# Patient Record
Sex: Female | Born: 1950 | Race: White
Health system: Southern US, Community
[De-identification: ages and names within clinical notes are randomized; demographics above are authoritative.]

## PROBLEM LIST (undated history)

## (undated) DIAGNOSIS — I341 Nonrheumatic mitral (valve) prolapse: Secondary | ICD-10-CM

## (undated) DIAGNOSIS — I10 Essential (primary) hypertension: Secondary | ICD-10-CM

## (undated) DIAGNOSIS — N200 Calculus of kidney: Secondary | ICD-10-CM

## (undated) DIAGNOSIS — B4481 Allergic bronchopulmonary aspergillosis: Secondary | ICD-10-CM

## (undated) DIAGNOSIS — E785 Hyperlipidemia, unspecified: Secondary | ICD-10-CM

## (undated) DIAGNOSIS — K635 Polyp of colon: Secondary | ICD-10-CM

## (undated) DIAGNOSIS — I82409 Acute embolism and thrombosis of unspecified deep veins of unspecified lower extremity: Secondary | ICD-10-CM

## (undated) DIAGNOSIS — F419 Anxiety disorder, unspecified: Secondary | ICD-10-CM

## (undated) DIAGNOSIS — Q501 Developmental ovarian cyst: Secondary | ICD-10-CM

## (undated) HISTORY — DX: Nonrheumatic mitral (valve) prolapse: I34.1

## (undated) HISTORY — DX: Hyperlipidemia, unspecified: E78.5

## (undated) HISTORY — DX: Allergic bronchopulmonary aspergillosis: B44.81

## (undated) HISTORY — DX: Polyp of colon: K63.5

## (undated) HISTORY — PX: COLONOSCOPY: SHX174

## (undated) HISTORY — DX: Acute embolism and thrombosis of unspecified deep veins of unspecified lower extremity: I82.409

## (undated) HISTORY — DX: Essential (primary) hypertension: I10

## (undated) HISTORY — PX: TONSILLECTOMY: SUR1361

## (undated) HISTORY — PX: OOPHORECTOMY: SHX86

## (undated) HISTORY — DX: Anxiety disorder, unspecified: F41.9

---

## 2000-10-24 ENCOUNTER — Emergency Department (HOSPITAL_COMMUNITY): Admission: EM | Admit: 2000-10-24 | Discharge: 2000-10-24 | Payer: Self-pay | Admitting: Emergency Medicine

## 2001-04-04 ENCOUNTER — Encounter: Payer: Self-pay | Admitting: Gynecology

## 2001-04-04 ENCOUNTER — Encounter: Admission: RE | Admit: 2001-04-04 | Discharge: 2001-04-04 | Payer: Self-pay | Admitting: Gynecology

## 2001-05-02 ENCOUNTER — Other Ambulatory Visit: Admission: RE | Admit: 2001-05-02 | Discharge: 2001-05-02 | Payer: Self-pay | Admitting: Gynecology

## 2002-05-11 ENCOUNTER — Other Ambulatory Visit: Admission: RE | Admit: 2002-05-11 | Discharge: 2002-05-11 | Payer: Self-pay | Admitting: Gynecology

## 2003-05-25 ENCOUNTER — Encounter: Admission: RE | Admit: 2003-05-25 | Discharge: 2003-05-25 | Payer: Self-pay | Admitting: Gynecology

## 2003-05-25 ENCOUNTER — Encounter: Payer: Self-pay | Admitting: Gynecology

## 2003-05-31 ENCOUNTER — Other Ambulatory Visit: Admission: RE | Admit: 2003-05-31 | Discharge: 2003-05-31 | Payer: Self-pay | Admitting: Gynecology

## 2003-06-14 ENCOUNTER — Encounter: Admission: RE | Admit: 2003-06-14 | Discharge: 2003-06-14 | Payer: Self-pay | Admitting: Internal Medicine

## 2003-06-14 ENCOUNTER — Encounter: Payer: Self-pay | Admitting: Internal Medicine

## 2003-07-05 ENCOUNTER — Encounter: Admission: RE | Admit: 2003-07-05 | Discharge: 2003-07-05 | Payer: Self-pay | Admitting: Family Medicine

## 2003-07-05 ENCOUNTER — Encounter: Payer: Self-pay | Admitting: Family Medicine

## 2003-08-09 ENCOUNTER — Other Ambulatory Visit: Admission: RE | Admit: 2003-08-09 | Discharge: 2003-08-09 | Payer: Self-pay | Admitting: Gynecology

## 2003-09-07 ENCOUNTER — Encounter (INDEPENDENT_AMBULATORY_CARE_PROVIDER_SITE_OTHER): Payer: Self-pay | Admitting: Specialist

## 2003-09-07 ENCOUNTER — Ambulatory Visit (HOSPITAL_COMMUNITY): Admission: RE | Admit: 2003-09-07 | Discharge: 2003-09-07 | Payer: Self-pay | Admitting: Internal Medicine

## 2003-09-25 ENCOUNTER — Ambulatory Visit (HOSPITAL_COMMUNITY): Admission: RE | Admit: 2003-09-25 | Discharge: 2003-09-25 | Payer: Self-pay | Admitting: Internal Medicine

## 2003-10-20 DIAGNOSIS — B4481 Allergic bronchopulmonary aspergillosis: Secondary | ICD-10-CM

## 2003-10-20 HISTORY — DX: Allergic bronchopulmonary aspergillosis: B44.81

## 2004-01-21 ENCOUNTER — Encounter: Admission: RE | Admit: 2004-01-21 | Discharge: 2004-01-21 | Payer: Self-pay | Admitting: Internal Medicine

## 2004-02-15 ENCOUNTER — Other Ambulatory Visit: Admission: RE | Admit: 2004-02-15 | Discharge: 2004-02-15 | Payer: Self-pay | Admitting: Gynecology

## 2004-04-27 ENCOUNTER — Emergency Department (HOSPITAL_COMMUNITY): Admission: EM | Admit: 2004-04-27 | Discharge: 2004-04-27 | Payer: Self-pay | Admitting: Family Medicine

## 2004-04-30 ENCOUNTER — Emergency Department (HOSPITAL_COMMUNITY): Admission: EM | Admit: 2004-04-30 | Discharge: 2004-04-30 | Payer: Self-pay | Admitting: Family Medicine

## 2004-04-30 ENCOUNTER — Encounter: Admission: RE | Admit: 2004-04-30 | Discharge: 2004-04-30 | Payer: Self-pay | Admitting: Internal Medicine

## 2004-07-11 ENCOUNTER — Ambulatory Visit (HOSPITAL_COMMUNITY): Admission: RE | Admit: 2004-07-11 | Discharge: 2004-07-11 | Payer: Self-pay | Admitting: Internal Medicine

## 2004-07-31 ENCOUNTER — Other Ambulatory Visit: Admission: RE | Admit: 2004-07-31 | Discharge: 2004-07-31 | Payer: Self-pay | Admitting: Gynecology

## 2004-08-06 ENCOUNTER — Ambulatory Visit (HOSPITAL_COMMUNITY): Admission: RE | Admit: 2004-08-06 | Discharge: 2004-08-06 | Payer: Self-pay | Admitting: Internal Medicine

## 2004-09-15 ENCOUNTER — Encounter: Admission: RE | Admit: 2004-09-15 | Discharge: 2004-09-15 | Payer: Self-pay | Admitting: Gynecology

## 2004-10-29 ENCOUNTER — Ambulatory Visit: Payer: Self-pay | Admitting: Internal Medicine

## 2004-12-29 ENCOUNTER — Ambulatory Visit: Payer: Self-pay | Admitting: Internal Medicine

## 2005-04-01 ENCOUNTER — Ambulatory Visit: Payer: Self-pay | Admitting: Internal Medicine

## 2005-07-30 ENCOUNTER — Ambulatory Visit: Payer: Self-pay | Admitting: Internal Medicine

## 2005-09-15 ENCOUNTER — Other Ambulatory Visit: Admission: RE | Admit: 2005-09-15 | Discharge: 2005-09-15 | Payer: Self-pay | Admitting: Gynecology

## 2005-09-23 ENCOUNTER — Ambulatory Visit: Admission: RE | Admit: 2005-09-23 | Discharge: 2005-09-23 | Payer: Self-pay | Admitting: Internal Medicine

## 2005-12-02 ENCOUNTER — Encounter: Admission: RE | Admit: 2005-12-02 | Discharge: 2005-12-02 | Payer: Self-pay | Admitting: Gynecology

## 2006-02-15 ENCOUNTER — Ambulatory Visit: Payer: Self-pay | Admitting: Internal Medicine

## 2006-08-03 ENCOUNTER — Emergency Department (HOSPITAL_COMMUNITY): Admission: EM | Admit: 2006-08-03 | Discharge: 2006-08-03 | Payer: Self-pay | Admitting: Family Medicine

## 2006-08-06 ENCOUNTER — Ambulatory Visit: Payer: Self-pay | Admitting: Internal Medicine

## 2006-08-16 ENCOUNTER — Ambulatory Visit: Payer: Self-pay | Admitting: Internal Medicine

## 2006-09-22 ENCOUNTER — Other Ambulatory Visit: Admission: RE | Admit: 2006-09-22 | Discharge: 2006-09-22 | Payer: Self-pay | Admitting: Gynecology

## 2006-10-19 DIAGNOSIS — K635 Polyp of colon: Secondary | ICD-10-CM

## 2006-10-19 HISTORY — DX: Polyp of colon: K63.5

## 2006-11-22 ENCOUNTER — Ambulatory Visit: Payer: Self-pay | Admitting: Internal Medicine

## 2007-01-04 ENCOUNTER — Encounter: Admission: RE | Admit: 2007-01-04 | Discharge: 2007-01-04 | Payer: Self-pay | Admitting: Gynecology

## 2007-01-06 ENCOUNTER — Ambulatory Visit: Payer: Self-pay | Admitting: Internal Medicine

## 2007-05-24 ENCOUNTER — Ambulatory Visit: Payer: Self-pay | Admitting: Internal Medicine

## 2007-05-31 ENCOUNTER — Encounter (INDEPENDENT_AMBULATORY_CARE_PROVIDER_SITE_OTHER): Payer: Self-pay | Admitting: *Deleted

## 2007-06-10 ENCOUNTER — Ambulatory Visit: Payer: Self-pay | Admitting: Internal Medicine

## 2007-06-10 DIAGNOSIS — B4481 Allergic bronchopulmonary aspergillosis: Secondary | ICD-10-CM | POA: Insufficient documentation

## 2007-06-10 LAB — CONVERTED CEMR LAB
HDL goal, serum: 40 mg/dL
LDL Goal: 160 mg/dL

## 2007-07-11 ENCOUNTER — Ambulatory Visit: Payer: Self-pay | Admitting: Internal Medicine

## 2007-08-17 ENCOUNTER — Ambulatory Visit: Payer: Self-pay | Admitting: Internal Medicine

## 2007-08-19 ENCOUNTER — Ambulatory Visit: Payer: Self-pay | Admitting: Internal Medicine

## 2007-08-26 ENCOUNTER — Encounter (INDEPENDENT_AMBULATORY_CARE_PROVIDER_SITE_OTHER): Payer: Self-pay | Admitting: *Deleted

## 2007-08-26 LAB — CONVERTED CEMR LAB
Cholesterol: 170 mg/dL (ref 0–200)
LDL Cholesterol: 99 mg/dL (ref 0–99)
Total CHOL/HDL Ratio: 3.1

## 2007-09-01 ENCOUNTER — Ambulatory Visit: Payer: Self-pay | Admitting: Internal Medicine

## 2007-10-20 HISTORY — PX: COLONOSCOPY W/ POLYPECTOMY: SHX1380

## 2007-10-20 HISTORY — PX: NASAL SINUS SURGERY: SHX719

## 2007-10-21 ENCOUNTER — Encounter: Payer: Self-pay | Admitting: Internal Medicine

## 2007-11-21 LAB — HM COLONOSCOPY: HM COLON: NORMAL

## 2007-11-22 ENCOUNTER — Telehealth (INDEPENDENT_AMBULATORY_CARE_PROVIDER_SITE_OTHER): Payer: Self-pay | Admitting: *Deleted

## 2008-01-06 ENCOUNTER — Encounter: Admission: RE | Admit: 2008-01-06 | Discharge: 2008-01-06 | Payer: Self-pay | Admitting: Gynecology

## 2008-01-09 ENCOUNTER — Ambulatory Visit: Payer: Self-pay | Admitting: Internal Medicine

## 2008-01-09 DIAGNOSIS — I82409 Acute embolism and thrombosis of unspecified deep veins of unspecified lower extremity: Secondary | ICD-10-CM | POA: Insufficient documentation

## 2008-01-09 DIAGNOSIS — J4530 Mild persistent asthma, uncomplicated: Secondary | ICD-10-CM

## 2008-01-18 ENCOUNTER — Encounter: Admission: RE | Admit: 2008-01-18 | Discharge: 2008-01-18 | Payer: Self-pay | Admitting: Gynecology

## 2008-02-28 ENCOUNTER — Encounter: Payer: Self-pay | Admitting: Internal Medicine

## 2008-04-25 LAB — HM COLONOSCOPY: HM Colonoscopy: NORMAL

## 2008-05-04 ENCOUNTER — Ambulatory Visit: Payer: Self-pay | Admitting: Internal Medicine

## 2008-05-30 ENCOUNTER — Telehealth (INDEPENDENT_AMBULATORY_CARE_PROVIDER_SITE_OTHER): Payer: Self-pay | Admitting: *Deleted

## 2008-07-11 ENCOUNTER — Ambulatory Visit: Payer: Self-pay | Admitting: Internal Medicine

## 2008-07-13 ENCOUNTER — Encounter: Payer: Self-pay | Admitting: Internal Medicine

## 2008-07-23 ENCOUNTER — Encounter (INDEPENDENT_AMBULATORY_CARE_PROVIDER_SITE_OTHER): Payer: Self-pay | Admitting: Otolaryngology

## 2008-07-23 ENCOUNTER — Ambulatory Visit (HOSPITAL_BASED_OUTPATIENT_CLINIC_OR_DEPARTMENT_OTHER): Admission: RE | Admit: 2008-07-23 | Discharge: 2008-07-23 | Payer: Self-pay | Admitting: Otolaryngology

## 2008-12-26 ENCOUNTER — Encounter: Payer: Self-pay | Admitting: Internal Medicine

## 2008-12-31 ENCOUNTER — Encounter (INDEPENDENT_AMBULATORY_CARE_PROVIDER_SITE_OTHER): Payer: Self-pay | Admitting: *Deleted

## 2008-12-31 ENCOUNTER — Ambulatory Visit: Payer: Self-pay | Admitting: Internal Medicine

## 2008-12-31 DIAGNOSIS — Z86010 Personal history of colonic polyps: Secondary | ICD-10-CM | POA: Insufficient documentation

## 2008-12-31 DIAGNOSIS — E785 Hyperlipidemia, unspecified: Secondary | ICD-10-CM

## 2008-12-31 DIAGNOSIS — R51 Headache: Secondary | ICD-10-CM

## 2008-12-31 DIAGNOSIS — R519 Headache, unspecified: Secondary | ICD-10-CM | POA: Insufficient documentation

## 2008-12-31 DIAGNOSIS — I10 Essential (primary) hypertension: Secondary | ICD-10-CM | POA: Insufficient documentation

## 2008-12-31 LAB — CONVERTED CEMR LAB
Cholesterol, target level: 200 mg/dL
HDL goal, serum: 50 mg/dL
LDL Goal: 100 mg/dL

## 2009-01-08 ENCOUNTER — Ambulatory Visit: Payer: Self-pay | Admitting: Internal Medicine

## 2009-01-15 LAB — CONVERTED CEMR LAB
BUN: 16 mg/dL (ref 6–23)
Basophils Absolute: 0 10*3/uL (ref 0.0–0.1)
CO2: 28 meq/L (ref 19–32)
Calcium: 8.9 mg/dL (ref 8.4–10.5)
Chloride: 105 meq/L (ref 96–112)
Eosinophils Relative: 11.2 % — ABNORMAL HIGH (ref 0.0–5.0)
GFR calc non Af Amer: 91.6 mL/min (ref >60)
HCT: 41.2 % (ref 36.0–46.0)
Hemoglobin: 14.1 g/dL (ref 12.0–15.0)
LDL Cholesterol: 111 mg/dL — ABNORMAL HIGH (ref 0–99)
Lymphs Abs: 2.2 10*3/uL (ref 0.7–4.0)
MCHC: 34.3 g/dL (ref 30.0–36.0)
MCV: 91.2 fL (ref 78.0–100.0)
Monocytes Relative: 6.9 % (ref 3.0–12.0)
Neutro Abs: 6 10*3/uL (ref 1.4–7.7)
Neutrophils Relative %: 60 % (ref 43.0–77.0)
Platelets: 317 10*3/uL (ref 150.0–400.0)
RBC: 4.52 M/uL (ref 3.87–5.11)
RDW: 13.2 % (ref 11.5–14.6)
Sodium: 141 meq/L (ref 135–145)
Total Bilirubin: 0.6 mg/dL (ref 0.3–1.2)
Total Protein: 6.3 g/dL (ref 6.0–8.3)
VLDL: 16.4 mg/dL (ref 0.0–40.0)
WBC: 10 10*3/uL (ref 4.5–10.5)

## 2009-01-17 ENCOUNTER — Encounter (INDEPENDENT_AMBULATORY_CARE_PROVIDER_SITE_OTHER): Payer: Self-pay | Admitting: *Deleted

## 2009-01-17 ENCOUNTER — Telehealth (INDEPENDENT_AMBULATORY_CARE_PROVIDER_SITE_OTHER): Payer: Self-pay | Admitting: *Deleted

## 2009-01-29 ENCOUNTER — Encounter: Admission: RE | Admit: 2009-01-29 | Discharge: 2009-01-29 | Payer: Self-pay | Admitting: Gynecology

## 2009-04-04 ENCOUNTER — Ambulatory Visit: Payer: Self-pay | Admitting: Internal Medicine

## 2009-04-07 LAB — CONVERTED CEMR LAB
ALT: 14 units/L (ref 0–35)
Albumin: 3.7 g/dL (ref 3.5–5.2)
Alkaline Phosphatase: 45 units/L (ref 39–117)
Bilirubin, Direct: 0.2 mg/dL (ref 0.0–0.3)
LDL Cholesterol: 95 mg/dL (ref 0–99)

## 2009-04-09 ENCOUNTER — Encounter (INDEPENDENT_AMBULATORY_CARE_PROVIDER_SITE_OTHER): Payer: Self-pay | Admitting: *Deleted

## 2009-04-25 ENCOUNTER — Telehealth (INDEPENDENT_AMBULATORY_CARE_PROVIDER_SITE_OTHER): Payer: Self-pay | Admitting: *Deleted

## 2009-05-07 ENCOUNTER — Ambulatory Visit: Payer: Self-pay | Admitting: Internal Medicine

## 2009-05-07 ENCOUNTER — Telehealth (INDEPENDENT_AMBULATORY_CARE_PROVIDER_SITE_OTHER): Payer: Self-pay | Admitting: *Deleted

## 2009-05-07 DIAGNOSIS — L723 Sebaceous cyst: Secondary | ICD-10-CM

## 2009-05-09 ENCOUNTER — Ambulatory Visit: Payer: Self-pay | Admitting: Internal Medicine

## 2009-05-09 ENCOUNTER — Encounter: Payer: Self-pay | Admitting: Internal Medicine

## 2009-05-10 ENCOUNTER — Telehealth (INDEPENDENT_AMBULATORY_CARE_PROVIDER_SITE_OTHER): Payer: Self-pay | Admitting: *Deleted

## 2009-05-10 ENCOUNTER — Encounter: Payer: Self-pay | Admitting: Internal Medicine

## 2009-05-10 ENCOUNTER — Encounter (INDEPENDENT_AMBULATORY_CARE_PROVIDER_SITE_OTHER): Payer: Self-pay | Admitting: *Deleted

## 2009-05-16 ENCOUNTER — Ambulatory Visit: Payer: Self-pay | Admitting: Internal Medicine

## 2009-05-16 ENCOUNTER — Telehealth (INDEPENDENT_AMBULATORY_CARE_PROVIDER_SITE_OTHER): Payer: Self-pay | Admitting: *Deleted

## 2009-05-17 ENCOUNTER — Ambulatory Visit: Payer: Self-pay | Admitting: Internal Medicine

## 2009-05-17 DIAGNOSIS — R930 Abnormal findings on diagnostic imaging of skull and head, not elsewhere classified: Secondary | ICD-10-CM | POA: Insufficient documentation

## 2009-07-09 ENCOUNTER — Ambulatory Visit: Payer: Self-pay | Admitting: Internal Medicine

## 2009-08-06 ENCOUNTER — Telehealth (INDEPENDENT_AMBULATORY_CARE_PROVIDER_SITE_OTHER): Payer: Self-pay | Admitting: *Deleted

## 2009-08-06 ENCOUNTER — Ambulatory Visit: Payer: Self-pay | Admitting: Family Medicine

## 2009-12-06 ENCOUNTER — Ambulatory Visit: Payer: Self-pay | Admitting: Internal Medicine

## 2009-12-10 ENCOUNTER — Ambulatory Visit: Payer: Self-pay | Admitting: Internal Medicine

## 2009-12-10 ENCOUNTER — Telehealth (INDEPENDENT_AMBULATORY_CARE_PROVIDER_SITE_OTHER): Payer: Self-pay | Admitting: *Deleted

## 2009-12-17 ENCOUNTER — Ambulatory Visit: Payer: Self-pay | Admitting: Internal Medicine

## 2009-12-18 ENCOUNTER — Telehealth (INDEPENDENT_AMBULATORY_CARE_PROVIDER_SITE_OTHER): Payer: Self-pay | Admitting: *Deleted

## 2010-01-08 ENCOUNTER — Ambulatory Visit: Payer: Self-pay | Admitting: Internal Medicine

## 2010-01-08 DIAGNOSIS — E559 Vitamin D deficiency, unspecified: Secondary | ICD-10-CM

## 2010-01-13 LAB — CONVERTED CEMR LAB
ALT: 17 units/L (ref 0–35)
Albumin: 4 g/dL (ref 3.5–5.2)
BUN: 13 mg/dL (ref 6–23)
Basophils Absolute: 0.1 10*3/uL (ref 0.0–0.1)
Bilirubin, Direct: 0 mg/dL (ref 0.0–0.3)
Calcium: 9.2 mg/dL (ref 8.4–10.5)
Cholesterol: 209 mg/dL — ABNORMAL HIGH (ref 0–200)
Eosinophils Absolute: 2.9 10*3/uL — ABNORMAL HIGH (ref 0.0–0.7)
Eosinophils Relative: 28.6 % — ABNORMAL HIGH (ref 0.0–5.0)
HDL: 65.8 mg/dL (ref >39.00)
Lymphocytes Relative: 21.8 % (ref 12.0–46.0)
Lymphs Abs: 2.2 10*3/uL (ref 0.7–4.0)
MCV: 92.8 fL (ref 78.0–100.0)
Monocytes Absolute: 0.5 10*3/uL (ref 0.1–1.0)
Monocytes Relative: 5.4 % (ref 3.0–12.0)
Neutro Abs: 4.3 10*3/uL (ref 1.4–7.7)
Neutrophils Relative %: 43 % (ref 43.0–77.0)
Platelets: 277 10*3/uL (ref 150.0–400.0)
RDW: 12.5 % (ref 11.5–14.6)
Total CHOL/HDL Ratio: 3
Triglycerides: 115 mg/dL (ref 0.0–149.0)
VLDL: 23 mg/dL (ref 0.0–40.0)
WBC: 10 10*3/uL (ref 4.5–10.5)

## 2010-01-28 ENCOUNTER — Ambulatory Visit: Payer: Self-pay | Admitting: Internal Medicine

## 2010-01-28 DIAGNOSIS — Z8701 Personal history of pneumonia (recurrent): Secondary | ICD-10-CM

## 2010-01-28 DIAGNOSIS — D721 Eosinophilia: Secondary | ICD-10-CM

## 2010-01-29 ENCOUNTER — Encounter: Payer: Self-pay | Admitting: Internal Medicine

## 2010-02-25 ENCOUNTER — Encounter: Admission: RE | Admit: 2010-02-25 | Discharge: 2010-02-25 | Payer: Self-pay | Admitting: Gynecology

## 2010-05-06 ENCOUNTER — Ambulatory Visit: Payer: Self-pay | Admitting: Internal Medicine

## 2010-07-01 ENCOUNTER — Ambulatory Visit: Payer: Self-pay | Admitting: Internal Medicine

## 2010-07-01 DIAGNOSIS — M545 Low back pain: Secondary | ICD-10-CM

## 2010-07-01 DIAGNOSIS — F411 Generalized anxiety disorder: Secondary | ICD-10-CM | POA: Insufficient documentation

## 2010-07-01 LAB — CONVERTED CEMR LAB
Bilirubin Urine: NEGATIVE
Nitrite: NEGATIVE
Protein, U semiquant: NEGATIVE
Specific Gravity, Urine: <1.005
Urobilinogen, UA: 0.2
WBC Urine, dipstick: NEGATIVE
pH: 6

## 2010-07-02 ENCOUNTER — Encounter: Payer: Self-pay | Admitting: Internal Medicine

## 2010-07-04 ENCOUNTER — Telehealth (INDEPENDENT_AMBULATORY_CARE_PROVIDER_SITE_OTHER): Payer: Self-pay | Admitting: *Deleted

## 2010-08-18 ENCOUNTER — Telehealth (INDEPENDENT_AMBULATORY_CARE_PROVIDER_SITE_OTHER): Payer: Self-pay | Admitting: *Deleted

## 2010-08-18 ENCOUNTER — Encounter (INDEPENDENT_AMBULATORY_CARE_PROVIDER_SITE_OTHER): Payer: Self-pay | Admitting: *Deleted

## 2010-08-18 ENCOUNTER — Ambulatory Visit: Payer: Self-pay | Admitting: Internal Medicine

## 2010-08-19 LAB — CONVERTED CEMR LAB
HDL: 50.6 mg/dL (ref >39.00)
LDL Cholesterol: 90 mg/dL (ref 0–99)
Total CHOL/HDL Ratio: 3
Triglycerides: 73 mg/dL (ref 0.0–149.0)
VLDL: 14.6 mg/dL (ref 0.0–40.0)

## 2010-08-21 ENCOUNTER — Telehealth (INDEPENDENT_AMBULATORY_CARE_PROVIDER_SITE_OTHER): Payer: Self-pay | Admitting: *Deleted

## 2010-10-28 ENCOUNTER — Telehealth: Payer: Self-pay | Admitting: Internal Medicine

## 2010-11-09 ENCOUNTER — Encounter: Payer: Self-pay | Admitting: Gynecology

## 2010-11-10 ENCOUNTER — Encounter: Payer: Self-pay | Admitting: Internal Medicine

## 2010-11-12 ENCOUNTER — Encounter: Payer: Self-pay | Admitting: Internal Medicine

## 2010-11-18 NOTE — Progress Notes (Signed)
Summary: Refill/ Lab results Request  Phone Note Refill Request Call back at [phone] OR [phone] Message from:  Patient on August 18, 2010 8:47 AM  Refills Requested: Medication #1:  BENICAR 20 MG  TABS 1/2 tablet daily  Medication #2:  SIMVASTATIN 40 MG TABS 1 at bedtime in place of Pravastatin 40 mg at bedtime  Medication #3:  WELLBUTIN 1.SIMVASTTIN (IF BLOOD WORK OK) [facility]( 3 MONTH AT A TIME FOR A YEAR)--2.BENICAR 10 MG -[facility] (3 MONTH AT A TIME FOR A YEAR)3. WELLBUTRIN-SITUATIONAL DEPRESSION AND CONCERITATION (HUSBAND RECOVERING FROM SURGERY WITH MAJOR COMPLICATIONS) [facility]   Initial call taken by: Freddy Jaksch,  August 18, 2010 8:47 AM  Follow-up for Phone Call        1.) Dr.Hopper please advise on Wellbutrin- NOT on med list, removed 05/2007   2.) Address labs Follow-up by: Shonna Chock CMA,  August 18, 2010 1:38 PM  Additional Follow-up for Phone Call Additional follow up Details #1::        Wellbutrin OK along with others X 6 months ( she'll be due for need labs in 6 months. Also   brand  may need to be changed due to her  2012 Formulary coverage(Ex. cheaper alternative available now for Benicar) Additional Follow-up by: Marga Melnick MD,  August 19, 2010 6:20 AM    New/Updated Medications: WELLBUTRIN XL 300 MG XR24H-TAB (BUPROPION HCL) 1 by mouth once daily Prescriptions: SIMVASTATIN 40 MG TABS (SIMVASTATIN) 1 at bedtime in place of Pravastatin 40 mg at bedtime  #90 x 1   Entered by:   Shonna Chock CMA   Authorized by:   Marga Melnick MD   Signed by:   Shonna Chock CMA on 08/20/2010   Method used:   Electronically to        [facility]* (retail)             ,          Ph: [phone]       Fax: [phone]   RxID:   [id number] WELLBUTRIN XL 300 MG XR24H-TAB (BUPROPION HCL) 1 by mouth once daily  #30 x 5   Entered by:   Shonna Chock CMA   Authorized by:   Marga Melnick MD   Signed by:   Shonna Chock CMA on 08/20/2010   Method used:   Electronically to        [id number]* (retail)       [address]       [place], [place]  [address]       Ph: [phone]       Fax: [phone]   RxID:   [id number] BENICAR 20 MG  TABS (OLMESARTAN MEDOXOMIL) 1/2 tablet daily  #90 x 1   Entered by:   Shonna Chock CMA   Authorized by:   Marga Melnick MD   Signed by:   Shonna Chock CMA on 08/18/2010   Method used:   Faxed to ...       [facility] (mail-order)             , [place]         Ph: [phone]       Fax: [phone]   RxID:   [id number]

## 2010-11-18 NOTE — Assessment & Plan Note (Signed)
Summary: tight chest, fatigue, cough/apc   Primary Provider/Referring Provider:  Alwyn Ren  CC:  Accute visit-sore throat Thursday; since then acid reflux, headaches, and Right sided chest pain with breathing. Sunday-Urgent care([facility]) no MI; WBC increased-gave zpak; CXR done-clear.Marland Kitchen  History of Present Illness: 07/09/09- Asthma, hx ABPA, hx DVT/PE She had fatigue, cough, maybe a little fever in july, just before vacation. She got an antibiotic oot, then returned to [place]. Was put on Levaquin. An incidental boil on her neck drained itself after she saw Dr Pollyann Kennedy for it. Now still hacking, but some sinus drainage, no fever, chills or glands. Some sputum, mostly clear buyt occasional deep sample is green. She is routinely on mucinex 1200 mg daily.Sinuses feel better after polypectomy last year and she continues EchoStar and flonase. Dr Alwyn Ren tried switch to Symbicort but she prefers Advair and went back to it.  January 28, 2010- Asthma, hx ABPA, Hx DVT/PE She caught respiratory infection from hiusband (immunosuppressed renal transplant) in February. This turned into RUL pneumonia. Ceftin changed to Avelox. Eosinophils were high (again) and IgE elevated 610. This is always high. She thought she was relapsing so when she finsihed Avelox she went back to finsih a last week of ceftin, now off antibiotics about 3 days. Feels tired but denies fever or sweat. She is improving but still some junky chest congestion.; Scant mucus is white, coming up as she moves around. Now using Advair twice daily. Somedays will use her rescue inhaler up to twice daily.  May 06, 2010- Asthma, Hx ABPA, Hx DVT/PE We had given doxycycline in April as she resolved a pneumonia.that had cleared her and she had felt "wonderful" since then. Now- Was at beach with sister who had a virus. Starting middle of last week had sore throat. By weekend was tired. Went to [facility] 3 days ago with malaise, pleuritic pain under right breast, fever  100.1. They did CXR "nothing", labs. Then put on azithromycin 250 mg two times a day x 5 days. She has remained very tired. Has some fronatal headache, no ear ache or sinus discharge. Still some soreness right anterolateral chest at times, heartburn but no other GI. Not wheezing.  Husband now needs aortic aneurysm repair.     Asthma History    Initial Asthma Severity Rating:    Age range: 12+ years    Symptoms: 0-2 days/week    Nighttime Awakenings: 0-2/month    Interferes w/ normal activity: no limitations    SABA use (not for EIB): 0-2 days/week    Asthma Severity Assessment: Intermittent   Preventive Screening-Counseling & Management  Alcohol-Tobacco     Smoking Status: quit     Year Started: 1976     Year Quit: 1981  Current Medications (verified): 1)  Advair Diskus 250-50 Mcg/dose Aepb (Fluticasone-Salmeterol) .Marland Kitchen.. 1 Puff and Rinse Twice Daily 2)  Flonase 50 Mcg/act  Susp (Fluticasone Propionate) .... Administer One Spray in Each Nostril Once Daily 3)  Benicar 20 Mg  Tabs (Olmesartan Medoxomil) .... 1/2 Tablet Daily 4)  Vivelle-Dot 0.0375 Mg/24hr  Pttw (Estradiol) .... Use As Directed 5)  Prometrium 200 Mg  Caps (Progesterone Micronized) .... Take For 12 Days Every 3rd Month. 6)  Mucinex 600 Mg  Tb12 (Guaifenesin) .... 2 Tablets Daily 7)  Bayer Aspirin Ec Low Dose 81 Mg  Tbec (Aspirin) .... 2 Tablets Daily 8)  Citracal Plus   Tabs (Multiple Minerals-Vitamins) .... 2 Tablets Daily 9)  Proventil Hfa 108 (90 Base) Mcg/act  Aers (Albuterol Sulfate) .Marland KitchenMarland KitchenMarland Kitchen  2 Puffs Four Times A Day Prn 10)  Lovenox 100 Mg/ml  Soln (Enoxaparin Sodium) .... 60 Mg  .every 12 Hours For Travel 11)  Vitamin D3 1000 Unit Caps (Cholecalciferol) .... Take 1 By Mouth Once Daily 12)  Zolpidem Tartrate 5 Mg Tabs (Zolpidem Tartrate) .Marland Kitchen.. 1 Q 3rd Night As Needed 13)  Lorazepam 0.5 Mg Tabs (Lorazepam) .Marland Kitchen.. 1 Q 8 Hrs As Needed Anxiety 14)  Simvastatin 40 Mg Tabs (Simvastatin) .Marland Kitchen.. 1 At Bedtime in Place of  Pravastatin 40 Mg At Bedtime 15)  Flutter Device For Pulmonary Clearance 16)  Fish Oil 1000 Mg Caps (Omega-3 Fatty Acids) .... Take 1 By Mouth Once Daily 17)  Doxycycline Hyclate 100 Mg Caps (Doxycycline Hyclate) .... 2 Today Then One Daily  Allergies (verified): 1)  ! 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 Aubery Lapping [facility], 2005 Hypertension Hyperlipidemia: NMR 2008: LDL 158(2810/1720), HDL 52, TG 218. LDL goal < 100 Colonic polyps, PMH of 2009, due 2012 PMH MVP, SBE prophylaxis, Dr Patty Sermons, Cardiology Headache, migraine variant  Past Surgical History: Last updated: 01/08/2010 G 3 P 2;  L oophorectomy for 3 cysts Sinus surgery for polyps, Dr Pollyann Kennedy ,2009 Tonsillectomy Colon polypectomy 2009  Family History: Last updated: 01/08/2010 Father:  tachycardia,dementia,asthma Mother: HTN, depression Siblings: sister HTN, 2 sisters depression & alcohol  abuse; P uncle Alsheimers'  Social History: Last updated: 01/08/2010 Former smoker,only smoked  4 years, quit 1982. Positive history of passive tobacco smoke exposure until 1990.  CVE 5X/ week treadmill X 30 min drinks 2 cups coffeee/ day married 2 children Social alcohol, 1X/week Occupation: Production designer, theatre/television/film for  [employer]  Risk Factors: Exercise: yes (05/04/2008)  Risk Factors: Smoking Status: quit (05/06/2010) Passive Smoke Exposure: yes (07/11/2008)     Review of Systems      See HPI  The patient denies shortness of breath with activity, shortness of breath at rest, productive cough, non-productive cough, coughing up blood, chest pain, irregular heartbeats, acid heartburn, indigestion, loss of appetite, weight change, abdominal pain, difficulty swallowing, sore throat, tooth/dental problems,  headaches, nasal congestion/difficulty breathing through nose, and sneezing.    Vital Signs:  Patient profile:   60 year old female Height:      65.75 inches Weight:      142.25 pounds BMI:     23.22 O2 Sat:      98 % on Room air Pulse rate:   110 / minute BP sitting:   140 / 84  (left arm) Cuff size:   regular  Vitals Entered By: Reynaldo Minium CMA (May 06, 2010 9:15 AM)  O2 Flow:  Room air CC: Accute visit-sore throat Thursday; since then acid reflux, headaches, Right sided chest pain with breathing. Sunday-Urgent care([facility]) no MI; WBC increased-gave zpak; CXR done-clear.   Physical Exam  Additional Exam:  General: A/Ox3; pleasant and cooperative, NAD, looks tired, a little puffy SKIN: no rash, lesions NODES: no lymphadenopathy HEENT: [place]/AT, EOM- WNL, Conjuctivae- clear, PERRLA, TM-WNL, Nose- clear, slight ly nasal, Throat- clear and wnl, voice raspy NECK: Supple w/ fair ROM, JVD- none, normal carotid impulses w/o bruits Thyroid-  CHEST: Clear, no rub. No dullness or Cough HEART: RRR, no m/g/r heard ABDOMEN: Soft and nl; n EAV:WUJW, nl pulses, no edema  NEURO: Grossly intact to observation  Impression & Recommendations:  Problem # 1:  ASTHMA (ICD-493.90) Acute viral syndrome with pleurisy. We discussed fluids. Will give standby doxycycline and have her take a light steroid coverage for a few days.  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 Orders: Est. Patient Level III (16109)  Medications Added to Medication List This Visit: 1)  Doxycycline Hyclate 100 Mg Caps (Doxycycline hyclate) .... 2 today then one daily  Patient Instructions: 1)  Keep appointment or return as needed 2)  Fluid and rest- trite, but good advice 3)  script for doxycycline- take it if  and when needd 4)  prednisone: 20 mg today, then 10 mg/ half tab, daily x 3 days, then stop. Call if needed. Prescriptions: DOXYCYCLINE HYCLATE 100 MG CAPS (DOXYCYCLINE HYCLATE) 2 today then one daily  #8 x 1   Entered and Authorized by:   Waymon Budge MD   Signed by:   Waymon Budge MD on 05/06/2010   Method used:   Print then Give to Patient   RxID:   [id number]

## 2010-11-18 NOTE — Progress Notes (Signed)
Summary: Culture results   Phone Note Outgoing Call   Call placed by: Shonna Chock CMA,  July 04, 2010 1:38 PM Call placed to: Patient Details for Reason: Culture Results Summary of Call: Left message on machine for patient to return call when avaliable, Reason for call:   Significant UTI NOT suggested as less than 100,000 colonies of a single bacteria present. Reculture if symptoms of UTI appear ( pain with urination , pus or blood in urine, fever). Levester Fresh CMA  July 04, 2010 1:38 PM   Follow-up for Phone Call        Patient aware Follow-up by: Shonna Chock CMA,  July 04, 2010 3:29 PM

## 2010-11-18 NOTE — Assessment & Plan Note (Signed)
Summary: 6 months/apc   Primary Provider/Referring Provider:  Alwyn Ren  CC:  follow up visit-approx 6 weeks ago had PNA-still fatigued and increased phelgm(opaque)..  History of Present Illness:  07/09/09- Asthma, hx ABPA, hx DVT/PE She had fatigue, cough, maybe a little fever in july, just before vacation. She got an antibiotic oot, then returned to [place]. Was put on Levaquin. An incidental boil on her neck drained itself after she saw Dr Pollyann Kennedy for it. Now still hacking, but some sinus drainage, no fever, chills or glands. Some sputum, mostly clear buyt occasional deep sample is green. She is routinely on mucinex 1200 mg daily.Sinuses feel better after polypectomy last year and she continues EchoStar and flonase. Dr Alwyn Ren tried switch to Symbicort but she prefers Advair and went back to it.  January 28, 2010- Asthma, hx ABPA, Hx DVT/PE She caught repiratory infection from hiusband (immunosuppressed renal transplant) in February. This turned into RUL pneumonia. Ceftin changed to Avelox. Eosinophils were high (again) and IgE elevated 610. This is always high. She thought she was relapsing so when she finsihed Avelox she went back to finsih a last week of ceftin, now off antibiotics about 3 days. Feels tired but denies fever or sweat. She is impoving but still some junky chest congestion.; Scant mucus is white, coming up as she moves around. Now using Advair twice daily. Somedays will use her rescue inhaler up to twice daily.     Current Medications (verified): 1)  Advair Diskus 250-50 Mcg/dose Aepb (Fluticasone-Salmeterol) .Marland Kitchen.. 1 Puff and Rinse Twice Daily 2)  Flonase 50 Mcg/act  Susp (Fluticasone Propionate) .... Administer One Spray in Each Nostril Once Daily 3)  Benicar 20 Mg  Tabs (Olmesartan Medoxomil) .... 1/2 Tablet Daily 4)  Vivelle-Dot 0.0375 Mg/24hr  Pttw (Estradiol) .... Use As Directed 5)  Prometrium 200 Mg  Caps (Progesterone Micronized) .... Take For 12 Days Every 3rd Month. 6)   Mucinex 600 Mg  Tb12 (Guaifenesin) .... 2 Tablets Daily 7)  Bayer Aspirin Ec Low Dose 81 Mg  Tbec (Aspirin) .... 2 Tablets Daily 8)  Citracal Plus   Tabs (Multiple Minerals-Vitamins) .... 2 Tablets Daily 9)  Proventil Hfa 108 (90 Base) Mcg/act  Aers (Albuterol Sulfate) .... 2 Puffs Four Times A Day Prn 10)  Lovenox 100 Mg/ml  Soln (Enoxaparin Sodium) .... 60 Mg [place] .every 12 Hours For Travel 11)  Vitamin D3 1000 Unit Caps (Cholecalciferol) .... Take 1 By Mouth Once Daily 12)  Zolpidem Tartrate 5 Mg Tabs (Zolpidem Tartrate) .Marland Kitchen.. 1 Q 3rd Night As Needed 13)  Lorazepam 0.5 Mg Tabs (Lorazepam) .Marland Kitchen.. 1 Q 8 Hrs As Needed Anxiety 14)  Simvastatin 40 Mg Tabs (Simvastatin) .Marland Kitchen.. 1 At Bedtime in Place of Pravastatin 40 Mg At Bedtime 15)  Flutter Device For Pulmonary Clearance 16)  Fish Oil 1000 Mg Caps (Omega-3 Fatty Acids) .... Take 1 By Mouth Once Daily  Allergies (verified): 1)  ! 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 Aubery Lapping [facility], 2005 Hypertension Hyperlipidemia: NMR 2008: LDL 158(2810/1720), HDL 52, TG 218. LDL goal < 100 Colonic polyps, PMH of 2009, due 2012 PMH MVP, SBE prophylaxis, Dr Patty Sermons, Cardiology Headache, migraine variant  Past Surgical History: Last updated: 01/08/2010 G 3 P 2;  L oophorectomy for 3 cysts Sinus surgery for polyps, Dr Pollyann Kennedy ,  2009 Tonsillectomy Colon polypectomy 2009  Family History: Last updated: 01/08/2010 Father:  tachycardia,dementia,asthma Mother: HTN, depression Siblings: sister HTN, 2 sisters depression & alcohol  abuse; P uncle Alsheimers'  Social History: Last updated: 01/08/2010 Former smoker,only smoked  4 years, quit 1982. Positive history of passive tobacco smoke exposure until 1990.  CVE 5X/ week treadmill X  30 min drinks 2 cups coffeee/ day married 2 children Social alcohol, 1X/week Occupation: Production designer, theatre/television/film for  [employer]  Risk Factors: Exercise: yes (05/04/2008)  Risk Factors: Smoking Status: quit (01/09/2008) Passive Smoke Exposure: yes (07/11/2008)  Review of Systems      See HPI       The patient complains of prolonged cough.  The patient denies anorexia, fever, weight loss, weight gain, vision loss, decreased hearing, hoarseness, chest pain, syncope, dyspnea on exertion, peripheral edema, headaches, hemoptysis, and severe indigestion/heartburn.    Vital Signs:  Patient profile:   60 year old female Height:      65.75 inches Weight:      142.13 pounds BMI:     23.20 O2 Sat:      96 % on Room air Pulse rate:   94 / minute BP sitting:   116 / 74  (left arm) Cuff size:   regular  Vitals Entered By: Reynaldo Minium CMA (January 28, 2010 4:08 PM)  O2 Flow:  Room air  Physical Exam  Additional Exam:  General: A/Ox3; pleasant and cooperative, NAD, SKIN: no rash, lesions NODES: no lymphadenopathy HEENT: [place]/AT, EOM- WNL, Conjuctivae- clear, PERRLA, TM-WNL, Nose- clear, slight ly nasal, Throat- clear and wnl, voice raspy NECK: Supple w/ fair ROM, JVD- none, normal carotid impulses w/o bruits Thyroid-  CHEST: coarse breath sounds/ rhonchi RUL posteriorly. No dullness or Cough HEART: RRR, no m/g/r heard ABDOMEN: Soft and nl; n ZOX:WRUE, nl pulses, no edema  NEURO: Grossly intact to observation      Impression & Recommendations:  Problem # 1:  CHEST XRAY, ABNORMAL (ICD-793.1) We havn't established etiology for pneumonia on CXR. Recent pneumonia was probably bacterial but we considered eosinophilc pneumonia and discussed her hx of [facility] [place] in late 1990/s. Doubt Cocci but will send serology if she doesn't clear. Will extend antibiotic a little longer with doxycycline for now.  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 Bronchoscopy years ago was done looking for aspergillus or equvalent fungus. That paper record is being brought from warehouse.  Medications Added to Medication List This Visit: 1)  Fish Oil 1000 Mg Caps (Omega-3 fatty acids) .... Take 1 by mouth once daily 2)  Doxycycline Hyclate 100 Mg Caps (Doxycycline hyclate) .... 2 today then one daily  Other Orders: Est. Patient Level III (45409)  Patient Instructions: 1)  Please schedule a follow-up appointment in 1 year. - earlier if needed as discussed. 2)  Script for doxycycline sent to your drug store Prescriptions: DOXYCYCLINE HYCLATE 100 MG CAPS (DOXYCYCLINE HYCLATE) 2 today then one daily  #10 x 0   Entered and Authorized by:   Waymon Budge MD   Signed by:   Waymon Budge MD on 01/28/2010   Method used:   Electronically to        [facility] [phone]* (retail)       [address]       [place], [place]  [address]       Ph: [phone]  Fax: [phone]   RxID:   [id number]

## 2010-11-18 NOTE — Progress Notes (Signed)
Summary: order for chest xray  Phone Note Call from Patient   Caller: Patient Summary of Call: was told that if she is no better that she could get chest x-ray at [facility] informed patient will put order in and she can go for chest x-ray.Marland KitchenMarland KitchenDoristine Devoid  December 10, 2009 11:10 AM

## 2010-11-18 NOTE — Assessment & Plan Note (Signed)
Summary: cpx/ns/kdc   Vital Signs:  Patient profile:   60 year old female Height:      65.75 inches Weight:      143 pounds BMI:     23.34 Temp:     98.3 degrees F oral Pulse rate:   70 / minute Resp:     14 per minute BP sitting:   126 / 80  (left arm) Cuff size:   regular  Vitals Entered By: Shonna Chock (January 08, 2010 8:31 AM) CC: CPX and fasting labs. Refill Lorazepam and Zolpidem Comments REVIEWED MED LIST, PATIENT AGREED DOSE AND INSTRUCTION CORRECT    Primary Care Provider:  Alwyn Ren  CC:  CPX and fasting labs. Refill Lorazepam and Zolpidem.  History of Present Illness: Mrs. Kuntzman is here for a physical; she is "80 % better, now able to take a deep breath". CXray revealed bronchiectatic component. She has had flu shot; Pneumonia vaccine taken in 2008.  Allergies: 1)  ! * Singulair  Past History:  Past Medical History: DVT LLE  post trauma & prolonged air travel, 2005, Lovenox X 6 months asthma treated as OP (ER X1, no IP admissions) Allergic Bronchopulmonary Aspergillosis, atypical/seronegative, Dr. Aubery Lapping [facility], 2005 Hypertension Hyperlipidemia: NMR 2008: LDL 158(2810/1720), HDL 52, TG 218. LDL goal < 100 Colonic polyps, PMH of 2009, due 2012 PMH MVP, SBE prophylaxis, Dr Patty Sermons, Cardiology Headache, migraine variant  Past Surgical History: G 3 P 2;  L oophorectomy for 3 cysts Sinus surgery for polyps, Dr Pollyann Kennedy ,2009 Tonsillectomy Colon polypectomy 2009  Family History: Father:  tachycardia,dementia,asthma Mother: HTN, depression Siblings: sister HTN, 2 sisters depression & alcohol  abuse; P uncle Alsheimers'  Social History: Former smoker,only smoked  4 years, quit 1982. Positive history of passive tobacco smoke exposure until 1990.  CVE 5X/ week treadmill X 30 min drinks 2 cups coffeee/ day married 2 children Social alcohol, 1X/week Occupation: Production designer, theatre/television/film for  [employer]  Review of Systems General:  Denies chills, fever, sweats,  and weight loss. Eyes:  Denies blurring, double vision, and vision loss-both eyes; Prodrome of blurred vision & hunger  pre migraine. Also aura of expanding visual field loss w/o actual headache.. ENT:  Denies decreased hearing, difficulty swallowing, hoarseness, nasal congestion, ringing in ears, and sinus pressure. CV:  Denies bluish discoloration of lips or nails, chest pain or discomfort, difficulty breathing at night, leg cramps with exertion, palpitations, shortness of breath with exertion, swelling of feet, and swelling of hands. Resp:  Complains of sputum productive; denies chest pain with inspiration, coughing up blood, and shortness of breath; Low grade wheezing intermittently. She averages 1-2 Tbsp of opaque sputum / day. GI:  Denies abdominal pain, bloody stools, constipation, dark tarry stools, diarrhea, and indigestion. GU:  Denies discharge, dysuria, and hematuria. MS:  Denies joint pain, joint redness, joint swelling, low back pain, mid back pain, and thoracic pain. Derm:  Denies changes in nail beds, dryness, hair loss, and lesion(s). Neuro:  Complains of headaches; denies disturbances in coordination, numbness, poor balance, and tingling. Psych:  Denies anxiety and depression. Endo:  Complains of heat intolerance; denies cold intolerance, excessive hunger, excessive thirst, and excessive urination. Heme:  Denies abnormal bruising and bleeding. Allergy:  Denies itching eyes, seasonal allergies, and sneezing.  Physical Exam  General:  well-nourished; alert,appropriate and cooperative throughout examination Head:  Normocephalic and atraumatic without obvious abnormalities.  Eyes:  No corneal or conjunctival inflammation noted. Perrla. Funduscopic exam benign, without hemorrhages, exudates or papilledema. Ears:  External ear exam shows no significant lesions or deformities.  Otoscopic examination reveals clear canals, tympanic membranes are intact bilaterally without bulging,  retraction, inflammation or discharge. Hearing is grossly normal bilaterally. Nose:  External nasal examination shows no deformity or inflammation. Nasal mucosa are pink and moist without lesions or exudates. Mouth:  Oral mucosa and oropharynx without lesions or exudates.  Teeth in good repair. Neck:  No deformities, masses, or tenderness noted. Lungs:  normal respiratory effort, no intercostal retractions, and no accessory muscle use.   Mild scattered low grade wheezing Heart:  Normal rate and regular rhythm. S1 and S2 normal without gallop, murmur, click, rub. Abdomen:  Bowel sounds positive,abdomen soft and non-tender without masses, organomegaly or hernias noted. Genitalia:  Dr Nicholas Lose Msk:  No deformity or scoliosis noted of thoracic or lumbar spine.   Pulses:  R and L carotid,radial,dorsalis pedis and posterior tibial pulses are full and equal bilaterally Extremities:  No clubbing, cyanosis, edema, or deformity noted with normal full range of motion of all joints.   Neurologic:  alert & oriented X3 and DTRs symmetrical and normal.   Skin:  Healing abrasion L great toe Cervical Nodes:  No lymphadenopathy noted Axillary Nodes:  No palpable lymphadenopathy Psych:  memory intact for recent and remote, normally interactive, and good eye contact.     Impression & Recommendations:  Problem # 1:  ROUTINE GENERAL MEDICAL EXAM@HEALTH  CARE FACL (ICD-V70.0)  Orders: EKG w/ Interpretation (93000) Venipuncture (41324) TLB-Lipid Panel (80061-LIPID) TLB-BMP (Basic Metabolic Panel-BMET) (80048-METABOL) TLB-CBC Platelet - w/Differential (85025-CBCD) TLB-Hepatic/Liver Function Pnl (80076-HEPATIC) TLB-TSH (Thyroid Stimulating Hormone) (84443-TSH) T-Vitamin D (25-Hydroxy) (40102-72536) T- * Misc. Laboratory test [phone])  Problem # 2:  ASTHMA (ICD-493.90)  Her updated medication list for this problem includes:    Advair Diskus 250-50 Mcg/dose Aepb (Fluticasone-salmeterol) .Marland Kitchen... 1 puff and rinse  twice daily    Proventil Hfa 108 (90 Base) Mcg/act Aers (Albuterol sulfate) .Marland Kitchen... 2 puffs four times a day prn  Orders: T- * Misc. Laboratory test [phone])  Problem # 3:  ASPERGILLOSIS, BRONCHOPULMONARY, ALLERGIC (ICD-518.6)  Orders: T- * Misc. Laboratory test [phone])  Problem # 4:  CHEST XRAY, ABNORMAL (ICD-793.1) Bronchiectatic component  Problem # 5:  HYPERLIPIDEMIA (ICD-272.4)  Her updated medication list for this problem includes:    Simvastatin 40 Mg Tabs (Simvastatin) .Marland Kitchen... 1 at bedtime in place of pravastatin 40 mg at bedtime  Orders: Venipuncture (75643) TLB-Lipid Panel (80061-LIPID)  Problem # 6:  HYPERTENSION (ICD-401.9)  Her updated medication list for this problem includes:    Benicar 20 Mg Tabs (Olmesartan medoxomil) .Marland Kitchen... 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 1 puff and rinse twice daily 2)  Flonase 50 Mcg/act Susp (Fluticasone propionate) .... Administer one spray in each nostril once daily 3)  Benicar 20 Mg Tabs (Olmesartan medoxomil) .... 1/2 tablet daily 4)  Vivelle-dot 0.0375 Mg/24hr Pttw (Estradiol) .... Use as directed 5)  Prometrium 200 Mg Caps (Progesterone micronized) .... Take for 12 days every 3rd month. 6)  Mucinex 600 Mg Tb12 (Guaifenesin) .... 2 tablets daily 7)  Bayer Aspirin Ec Low Dose 81 Mg Tbec (Aspirin) .... 2 tablets daily 8)  Citracal Plus Tabs (Multiple minerals-vitamins) .... 2 tablets daily 9)  Proventil Hfa 108 (  90 Base) Mcg/act Aers (Albuterol sulfate) .... 2 puffs four times a day prn 10)  Lovenox 100 Mg/ml Soln (Enoxaparin sodium) .... 60 mg [place] .every 12 hours for travel 11)  Vitamin D3 1000 Unit Caps  (Cholecalciferol) .... Take 1 by mouth once daily 12)  Zolpidem Tartrate 5 Mg Tabs (Zolpidem tartrate) .Marland Kitchen.. 1 q 3rd night as needed 13)  Lorazepam 0.5 Mg Tabs (Lorazepam) .Marland Kitchen.. 1 q 8 hrs as needed anxiety 14)  Simvastatin 40 Mg Tabs (Simvastatin) .Marland Kitchen.. 1 at bedtime in place of pravastatin 40 mg at bedtime 15)  Flutter Device For Pulmonary Clearance   Patient Instructions: 1)  Stay well hydrated; consider inversion table or anti gravity boots as discussed. Prescriptions: ZOLPIDEM TARTRATE 5 MG TABS (ZOLPIDEM TARTRATE) 1 q 3rd night as needed  #30 x 0   Entered and Authorized by:   Marga Melnick MD   Signed by:   Marga Melnick MD on 01/08/2010   Method used:   Print then Give to Patient   RxID:   [id number] LORAZEPAM 0.5 MG TABS (LORAZEPAM) 1 q 8 hrs as needed anxiety  #30 x 5   Entered and Authorized by:   Marga Melnick MD   Signed by:   Marga Melnick MD on 01/08/2010   Method used:   Print then Give to Patient   RxID:   [id number] SIMVASTATIN 40 MG TABS (SIMVASTATIN) 1 at bedtime in place of Pravastatin 40 mg at bedtime  #90 x 3   Entered and Authorized by:   Marga Melnick MD   Signed by:   Marga Melnick MD on 01/08/2010   Method used:   Print then Give to Patient   RxID:   [id number]

## 2010-11-18 NOTE — Assessment & Plan Note (Signed)
  Nurse Visit   Allergies: 1)  ! * Singulair

## 2010-11-18 NOTE — Progress Notes (Signed)
Summary: RX Concerns-left msg to have pt return call  Phone Note From Pharmacy   Caller: [facility] Summary of Call: Paperwork sent over requesting that Dr.Hopper choose an alternative for Benicar, the alternatives include:  Losartan, Diovan or Micardis.  Dr.Hopper please advise./Chrae Marlynn Perking CMA  August 21, 2010 10:23 AM   Follow-up for Phone Call        Losartan 100 mg #90 Follow-up by: Marga Melnick MD,  August 21, 2010 12:48 PM  Additional Follow-up for Phone Call Additional follow up Details #1::        left msg w/ husband to have patient return call.........Marland KitchenDoristine Devoid CMA  August 22, 2010 3:10 PM   spoke w/ patient aware of change in prescription ............Marland KitchenDoristine Devoid CMA  August 22, 2010 4:18 PM     New/Updated Medications: LOSARTAN POTASSIUM 100 MG TABS (LOSARTAN POTASSIUM) 1 by mouth qd Prescriptions: LOSARTAN POTASSIUM 100 MG TABS (LOSARTAN POTASSIUM) 1 by mouth qd  #90 x 0   Entered by:   Lucious Groves CMA   Authorized by:   Marga Melnick MD   Signed by:   Lucious Groves CMA on 08/21/2010   Method used:   Faxed to ...       [facility] (mail-order)             , [place]         Ph: [phone]       Fax: [phone]   RxID:   [id number]

## 2010-11-18 NOTE — Progress Notes (Signed)
Summary: Chest Xray results  Phone Note Call from Patient Call back at [phone]   Summary of Call: Message left on VM: Patient would like someone to call with Chest Xray Results.   I spoke with patient and informed her report mailed today and gave Dr.Hopper's comments:  Pneumonia picture improved but chronic changes in lower lobes suggest bronchiectasis(Go to WebMD). If symptoms fail to resolve completely ; Dr Fannie Knee should be consulted as possibly brochoscopy or other intervention(intensive pulmonary toilet) may be necessary. Hopp  Patient ok'd information and indicated that she has a pending appointment with Dr.Young this month./Chrae [facility]  December 18, 2009 12:29 PM

## 2010-11-18 NOTE — Assessment & Plan Note (Signed)
Summary: LEFT FLANK PANK W/NAUSEA/RH......   Vital Signs:  Patient profile:   60 year old female Weight:      142.8 pounds BMI:     23.31 Temp:     98.1 degrees F oral Pulse rate:   76 / minute Resp:     14 per minute BP sitting:   122 / 80  (left arm) Cuff size:   regular  Vitals Entered By: Shonna Chock CMA (July 01, 2010 12:03 PM) CC: Left side lower back pain, sweating and nauseated. Patient said when symptoms onset they last about 1 hour. Patient would like to note that she is under a lot of stress , Back pain   Primary Care Provider:  Alwyn Ren  CC:  Left side lower back pain, sweating and nauseated. Patient said when symptoms onset they last about 1 hour. Patient would like to note that she is under a lot of stress , and Back pain.  History of Present Illness: Back Pain:      This is a 60 year old woman who presents with  acute back pain.  The patient reports  onset of  pain @ rest ; she  denies fever, hematuria, discharge or dysuria.  The pain is located in the left low back; it woke her @ 4 am from sleep & was associated with nausea & sweating.That episode  lasted about 1 hr. It recurred while driving  @ 8 am  & lasted for 45 min  & was associated with vomiting X 1.  The pain  initially began at home and suddenly w/o trigger or position change.  Only the initial pain radiated  to the left groin. No PMH of significant back issues or GU issues.PMH of DVT.                                                            Stress: Her husband ahs been @ [facility] 4&1/2 weeks critically ill after thoracic arch surgery.She has been @ [facility] for 3 weeks; she feels sleep has been adequate.  Allergies: 1)  ! * Singulair  Review of Systems General:  Denies chills. Resp:  Denies chest pain with inspiration, cough, coughing up blood, shortness of breath, sputum productive, and wheezing. GI:  Denies bloody stools, constipation, dark tarry stools, and diarrhea. Derm:  Denies lesion(s) and  rash. Neuro:  Denies disturbances in coordination and poor balance. Psych:  Complains of anxiety and depression; denies easily angered, easily tearful, and irritability; "Numb now".  Physical Exam  General:  well-nourished,in no acute distress; alert,appropriate and cooperative throughout examination Lungs:  Normal respiratory effort, chest expands symmetrically. Lungs are clear to auscultation, no crackles or wheezes. Heart:  Normal rate and regular rhythm. S1 and S2 normal without gallop, murmur, click, rub.S4 Abdomen:  Bowel sounds positive,abdomen soft and non-tender without masses, organomegaly or hernias noted. Aorta palpable w/o AAA Msk:  No deformity or scoliosis noted of  lumbar spine. She lay down & sat up w/o help Pulses:  R and L carotid,radial,dorsalis pedis and posterior tibial pulses are full and equal bilaterally Extremities:  No clubbing, cyanosis, edema, or deformity noted with normal full range of motion of all joints.   Neg Homan's Neurologic:  alert & oriented X3, strength normal in all extremities, gait normal, and  DTRs symmetrical and normal.   Skin:  Intact without suspicious lesions or rashes Cervical Nodes:  No lymphadenopathy noted Axillary Nodes:  No palpable lymphadenopathy Psych:  Oriented X3, normally interactive, not anxious appearing, not depressed appearing,  but  subdued.     Impression & Recommendations:  Problem # 1:  LOW BACK PAIN SYNDROME (ICD-724.2)  ? related to staying OOT in motel ,under stress Her updated medication list for this problem includes:    Bayer Aspirin Ec Low Dose 81 Mg Tbec (Aspirin) .Marland Kitchen... 2 tablets daily    Cyclobenzaprine Hcl 5 Mg Tabs (Cyclobenzaprine hcl) .Marland Kitchen... 1-2 at bedtime as needed for back pain    Hydrocodone-acetaminophen 5-500 Mg Tabs (Hydrocodone-acetaminophen) .Marland Kitchen... 1 every 4-6 hrs as needed severe back pain  Orders: T-Culture, Urine ([id number])  Problem # 2:  ANXIETY STATE, UNSPECIFIED (ICD-300.00) Situational  & appropriate Her updated medication list for this problem includes:    Lorazepam 0.5 Mg Tabs (Lorazepam) .Marland Kitchen... 1 q 8 hrs as needed anxiety  Complete Medication List: 1)  Advair Diskus 250-50 Mcg/dose Aepb (Fluticasone-salmeterol) .Marland Kitchen.. 1 puff and rinse twice daily 2)  Flonase 50 Mcg/act Susp (Fluticasone propionate) .... Administer one spray in each nostril once daily 3)  Benicar 20 Mg Tabs (Olmesartan medoxomil) .... 1/2 tablet daily 4)  Vivelle-dot 0.0375 Mg/24hr Pttw (Estradiol) .... Use as directed 5)  Prometrium 200 Mg Caps (Progesterone micronized) .... Take for 12 days every 3rd month. 6)  Mucinex 600 Mg Tb12 (Guaifenesin) .... 2 tablets daily 7)  Bayer Aspirin Ec Low Dose 81 Mg Tbec (Aspirin) .... 2 tablets daily 8)  Citracal Plus Tabs (Multiple minerals-vitamins) .Marland Kitchen.. 1 tablets daily 9)  Proventil Hfa 108 (90 Base) Mcg/act Aers (Albuterol sulfate) .... 2 puffs four times a day prn 10)  Lovenox 100 Mg/ml Soln (Enoxaparin sodium) .... 60 mg [place] .every 12 hours for travel 11)  Vitamin D3 1000 Unit Caps (Cholecalciferol) .... Take 1 by mouth once daily 12)  Lorazepam 0.5 Mg Tabs (Lorazepam) .Marland Kitchen.. 1 q 8 hrs as needed anxiety 13)  Simvastatin 40 Mg Tabs (Simvastatin) .Marland Kitchen.. 1 at bedtime in place of pravastatin 40 mg at bedtime 14)  Flutter Device For Pulmonary Clearance  15)  Fish Oil 1000 Mg Caps (Omega-3 fatty acids) .... Take 1 by mouth once daily 16)  Cyclobenzaprine Hcl 5 Mg Tabs (Cyclobenzaprine hcl) .Marland Kitchen.. 1-2 at bedtime as needed for back pain 17)  Hydrocodone-acetaminophen 5-500 Mg Tabs (Hydrocodone-acetaminophen) .Marland Kitchen.. 1 every 4-6 hrs as needed severe back pain  Other Orders: UA Dipstick w/o Micro (manual) (62130)  Patient Instructions: 1)  Consider stretching exercises for back as discussed. Prescriptions: LORAZEPAM 0.5 MG TABS (LORAZEPAM) 1 q 8 hrs as needed anxiety  #30 x 5   Entered and Authorized by:   Marga Melnick MD   Signed by:   Marga Melnick MD on 07/01/2010    Method used:   Print then Give to Patient   RxID:   [id number] HYDROCODONE-ACETAMINOPHEN 5-500 MG TABS (HYDROCODONE-ACETAMINOPHEN) 1 every 4-6 hrs as needed severe back pain  #30 x 0   Entered and Authorized by:   Marga Melnick MD   Signed by:   Marga Melnick MD on 07/01/2010   Method used:   Print then Give to Patient   RxID:   [id number] CYCLOBENZAPRINE HCL 5 MG TABS (CYCLOBENZAPRINE HCL) 1-2 at bedtime as needed for back pain  #20 x 0   Entered and Authorized by:   Marga Melnick MD   Signed by:  Marga Melnick MD on 07/01/2010   Method used:   Print then Give to Patient   RxID:   [id number]   Laboratory Results   Urine Tests    Routine Urinalysis   Color: lt. yellow Appearance: Clear Glucose: negative   (Normal Range: Negative) Bilirubin: negative   (Normal Range: Negative) Ketone: negative   (Normal Range: Negative) Spec. Gravity: <1.005   (Normal Range: 1.003-1.035) Blood: negative   (Normal Range: Negative) pH: 6.0   (Normal Range: 5.0-8.0) Protein: negative   (Normal Range: Negative) Urobilinogen: 0.2   (Normal Range: 0-1) Nitrite: negative   (Normal Range: Negative) Leukocyte Esterace: negative   (Normal Range: Negative)

## 2010-11-18 NOTE — Letter (Signed)
Summary: Beather Arbour MD  Beather Arbour MD   Imported By: Lanelle Bal 02/05/2010 10:07:40  _____________________________________________________________________  External Attachment:    Type:   Image     Comment:   External Document

## 2010-11-18 NOTE — Assessment & Plan Note (Signed)
Summary: CHEST CONG W/FEVER/RH......Marland Kitchen   Vital Signs:  Patient profile:   60 year old female Weight:      140.4 pounds Temp:     99.8 degrees F oral Pulse rate:   124 / minute Resp:     14 per minute BP sitting:   116 / 78  (left arm) Cuff size:   large  Vitals Entered By: Shonna Chock (December 06, 2009 10:57 AM) CC: Head congestion, fever, achy-legs,cough & headache x 1 week Comments REVIEWED MED LIST, PATIENT AGREED DOSE AND INSTRUCTION CORRECT    Primary Care Provider:  Alwyn Ren  CC:  Head congestion, fever, achy-legs, and cough & headache x 1 week.  History of Present Illness: Onset 11/30/2009 as rhinitis, head congestion  followed by low grade fever, myalgias. NP cough ; temp to 101.5. She had flu shot in 09/2009. Her husband had this 2 weeks ago; no fever but he is on immunosuppressants.  Allergies: 1)  ! * Singulair  Review of Systems General:  Complains of chills, fatigue, and sweats. ENT:  Complains of sinus pressure; denies nasal congestion; Frontal headache with yellow D/C. No facial pain. Neti pot once daily & nsal spray once daily . CV:  DOE with stairs. Resp:  Denies shortness of breath and sputum productive; some wheezing @ night.  Physical Exam  General:  in no acute distress; alert,appropriate and cooperative throughout examination Ears:  External ear exam shows no significant lesions or deformities.  Otoscopic examination reveals clear canals, tympanic membranes are intact bilaterally without bulging, retraction, inflammation or discharge. Hearing is grossly normal bilaterally. Nose:  External nasal examination shows no deformity or inflammation. Nasal mucosa are pink and moist without lesions or exudates. Mouth:  Oral mucosa and oropharynx without lesions or exudates.  Teeth in good repair. Lungs:  Normal respiratory effort, chest expands symmetrically. Lungs : mild rhonchi RLL Cervical Nodes:  No lymphadenopathy noted Axillary Nodes:  No palpable  lymphadenopathy   Impression & Recommendations:  Problem # 1:  SINUSITIS- ACUTE-NOS (ICD-461.9)  The following medications were removed from the medication list:    Augmentin 875-125 Mg Tabs (Amoxicillin-pot clavulanate) .Marland Kitchen... 1 twice daily    Avelox 400 Mg Tabs (Moxifloxacin hcl) .Marland Kitchen... 1 by mouth once daily    Cheratussin Ac 100-10 Mg/2ml Syrp (Guaifenesin-codeine) .Marland Kitchen... 1-2 tsp by mouth at bedtime as needed Her updated medication list for this problem includes:    Flonase 50 Mcg/act Susp (Fluticasone propionate) .Marland Kitchen... Administer one spray in each nostril once daily    Mucinex 600 Mg Tb12 (Guaifenesin) .Marland Kitchen... 2 tablets daily    Cefuroxime Axetil 500 Mg Tabs (Cefuroxime axetil) .Marland Kitchen... 1 two times a day  Problem # 2:  ACUTE BRONCHITIS (ICD-466.0) R/O RLL process The following medications were removed from the medication list:    Augmentin 875-125 Mg Tabs (Amoxicillin-pot clavulanate) .Marland Kitchen... 1 twice daily    Avelox 400 Mg Tabs (Moxifloxacin hcl) .Marland Kitchen... 1 by mouth once daily    Cheratussin Ac 100-10 Mg/55ml Syrp (Guaifenesin-codeine) .Marland Kitchen... 1-2 tsp by mouth at bedtime as needed Her updated medication list for this problem includes:    Advair Diskus 250-50 Mcg/dose Aepb (Fluticasone-salmeterol) .Marland Kitchen... 1 puff and rinse twice daily    Mucinex 600 Mg Tb12 (Guaifenesin) .Marland Kitchen... 2 tablets daily    Proventil Hfa 108 (90 Base) Mcg/act Aers (Albuterol sulfate) .Marland Kitchen... 2 puffs four times a day prn    Cefuroxime Axetil 500 Mg Tabs (Cefuroxime axetil) .Marland Kitchen... 1 two times a day  Complete Medication List: 1)  Advair Diskus 250-50 Mcg/dose Aepb (Fluticasone-salmeterol) .Marland Kitchen.. 1 puff and rinse twice daily 2)  Flonase 50 Mcg/act Susp (Fluticasone propionate) .... Administer one spray in each nostril once daily 3)  Benicar 20 Mg Tabs (Olmesartan medoxomil) .... 1/2 tablet daily 4)  Vivelle-dot 0.0375 Mg/24hr Pttw (Estradiol) .... Use as directed 5)  Prometrium 200 Mg Caps (Progesterone micronized) .... Take for 12 days  every 3rd month. 6)  Mucinex 600 Mg Tb12 (Guaifenesin) .... 2 tablets daily 7)  Bayer Aspirin Ec Low Dose 81 Mg Tbec (Aspirin) .... 2 tablets daily 8)  Citracal Plus Tabs (Multiple minerals-vitamins) .... 2 tablets daily 9)  Claritin 10 Mg Tabs (Loratadine) .... Take 1 tablet by mouth once a day 10)  Proventil Hfa 108 (90 Base) Mcg/act Aers (Albuterol sulfate) .... 2 puffs four times a day prn 11)  Lovenox 100 Mg/ml Soln (Enoxaparin sodium) .... 60 mg [place] .every 12 hours for travel 12)  Vitamin D3 1000 Unit Caps (Cholecalciferol) .... Take 1 by mouth once daily 13)  Zolpidem Tartrate 5 Mg Tabs (Zolpidem tartrate) .Marland Kitchen.. 1 q 3rd night as needed 14)  Lorazepam 0.5 Mg Tabs (Lorazepam) .Marland Kitchen.. 1 q 8 hrs as needed anxiety 15)  Simvastatin 40 Mg Tabs (Simvastatin) .Marland Kitchen.. 1 at bedtime in place of pravastatin 40 mg at bedtime 16)  Flutter Device For Pulmonary Clearance  17)  Cefuroxime Axetil 500 Mg Tabs (Cefuroxime axetil) .Marland Kitchen.. 1 two times a day  Patient Instructions: 1)  Use Advair 500/50 two times a day X 1 week(sample). 2)  Drink as much fluid as you can tolerate for the next few days.Neti pot once daily as needed . Nasal spray two times a day until sinuses are clear. CXray if no better over 48-72 hrs. Prescriptions: CEFUROXIME AXETIL 500 MG TABS (CEFUROXIME AXETIL) 1 two times a day  #20 x 0   Entered and Authorized by:   Marga Melnick MD   Signed by:   Marga Melnick MD on 12/06/2009   Method used:   Faxed to ...       [facility]  [address] [phone]* (retail)       [address]       [place], [place]  [address]       Ph: [phone]       Fax: [phone]   RxID:   [id number] CEFUROXIME AXETIL 500 MG TABS (CEFUROXIME AXETIL) 1 two times a day  #20 x 0   Entered and Authorized by:   Marga Melnick MD   Signed by:   Marga Melnick MD on 12/06/2009   Method used:   Print then Give to Patient   RxID:   [id number] VITAMIN D3 1000 UNIT CAPS (CHOLECALCIFEROL) take 1 by mouth once daily  #1 mo  supply x 11   Entered by:   Shonna Chock   Authorized by:   Marga Melnick MD   Signed by:   Shonna Chock on 12/06/2009   Method used:   Print then Give to Patient   RxID:   [id number] CITRACAL PLUS   TABS (MULTIPLE MINERALS-VITAMINS) 2 tablets daily  #66mo supply x 11   Entered by:   Shonna Chock   Authorized by:   Marga Melnick MD   Signed by:   Shonna Chock on 12/06/2009   Method used:   Print then Give to Patient   RxID:   [id number] BAYER ASPIRIN EC LOW DOSE 81 MG  TBEC (ASPIRIN) 2 tablets daily  #1 mo supply x 11  Entered by:   Shonna Chock   Authorized by:   Marga Melnick MD   Signed by:   Shonna Chock on 12/06/2009   Method used:   Print then Give to Patient   RxID:   [id number] MUCINEX 600 MG  TB12 (GUAIFENESIN) 2 tablets daily  #19mo supply x 11   Entered by:   Shonna Chock   Authorized by:   Marga Melnick MD   Signed by:   Shonna Chock on 12/06/2009   Method used:   Print then Give to Patient   RxID:   [id number]

## 2010-11-18 NOTE — Progress Notes (Signed)
Summary: Chest Xray Results  Phone Note Outgoing Call Call back at [facility] Phone [phone]   Call placed by: Shonna Chock,  December 10, 2009 3:34 PM Call placed to: Patient Details for Reason: Chest Xray results Summary of Call:  Pneumonia in R upper lobe. Recommend change to Avelox 400 mg once daily X 7 days with repeat CXray after completion (486) Hopp ** Copy of Xray and Chest Xray Order Mailed** Chrae Malloy  December 10, 2009 3:34 PM     New/Updated Medications: AVELOX 400 MG TABS (MOXIFLOXACIN HCL) 1 by mouth x 7 days Prescriptions: AVELOX 400 MG TABS (MOXIFLOXACIN HCL) 1 by mouth x 7 days  #7 x 0   Entered by:   Shonna Chock   Authorized by:   Marga Melnick MD   Signed by:   Shonna Chock on 12/10/2009   Method used:   Electronically to        [facility] [phone]* (retail)       [address]       [place], [place]  [address]       Ph: [phone]       Fax: [phone]   RxID:   [id number]

## 2010-11-20 NOTE — Progress Notes (Signed)
Summary: [facility] refill--Wellbutrin  Phone Note Refill Request Message from:  Fax from Pharmacy on October 28, 2010 10:07 AM  Refills Requested: Medication #1:  WELLBUTRIN XL 300 MG XR24H-TAB 1 by mouth once daily   Notes: [facility] Initial call taken by: Lucious Groves CMA,  October 28, 2010 10:08 AM    Prescriptions: WELLBUTRIN XL 300 MG XR24H-TAB (BUPROPION HCL) 1 by mouth once daily  #90 x 2   Entered by:   Lucious Groves CMA   Authorized by:   Marga Melnick MD   Signed by:   Lucious Groves CMA on 10/28/2010   Method used:   Faxed to ...       [facility]* (retail)             ,          Ph: [phone]       Fax: [phone]   RxID:   [id number]

## 2011-01-12 ENCOUNTER — Encounter: Payer: Self-pay | Admitting: Internal Medicine

## 2011-01-12 ENCOUNTER — Ambulatory Visit (INDEPENDENT_AMBULATORY_CARE_PROVIDER_SITE_OTHER): Payer: Self-pay | Admitting: Internal Medicine

## 2011-01-12 VITALS — BP 112/70 | HR 76 | Temp 97.9°F | Resp 14 | Ht 66.0 in | Wt 141.4 lb

## 2011-01-12 DIAGNOSIS — I10 Essential (primary) hypertension: Secondary | ICD-10-CM

## 2011-01-12 DIAGNOSIS — Z0000 Encounter for general adult medical examination without abnormal findings: Secondary | ICD-10-CM

## 2011-01-12 DIAGNOSIS — B4481 Allergic bronchopulmonary aspergillosis: Secondary | ICD-10-CM

## 2011-01-12 LAB — CBC WITH DIFFERENTIAL/PLATELET
Basophils Absolute: 0 10*3/uL (ref 0.0–0.1)
Eosinophils Absolute: 0.7 10*3/uL (ref 0.0–0.7)
HCT: 42.9 % (ref 36.0–46.0)
Hemoglobin: 14.7 g/dL (ref 12.0–15.0)
Lymphs Abs: 1.8 10*3/uL (ref 0.7–4.0)
MCHC: 34.3 g/dL (ref 30.0–36.0)
MCV: 90.8 fl (ref 78.0–100.0)
Monocytes Absolute: 0.4 10*3/uL (ref 0.1–1.0)
Monocytes Relative: 6.5 % (ref 3.0–12.0)
Neutro Abs: 3.2 10*3/uL (ref 1.4–7.7)
Platelets: 283 10*3/uL (ref 150.0–400.0)
RDW: 13.8 % (ref 11.5–14.6)

## 2011-01-12 LAB — BASIC METABOLIC PANEL
CO2: 30 mEq/L (ref 19–32)
Calcium: 9.5 mg/dL (ref 8.4–10.5)
Creatinine, Ser: 0.6 mg/dL (ref 0.4–1.2)
GFR: 108.67 mL/min (ref >60.00)
Sodium: 138 mEq/L (ref 135–145)

## 2011-01-12 LAB — LIPID PANEL
Cholesterol: 179 mg/dL (ref 0–200)
HDL: 59 mg/dL (ref >39.00)
Total CHOL/HDL Ratio: 3
Triglycerides: 80 mg/dL (ref 0.0–149.0)

## 2011-01-12 LAB — HEPATIC FUNCTION PANEL
Alkaline Phosphatase: 38 U/L — ABNORMAL LOW (ref 39–117)
Bilirubin, Direct: 0.1 mg/dL (ref 0.0–0.3)
Total Protein: 6.2 g/dL (ref 6.0–8.3)

## 2011-01-12 NOTE — Progress Notes (Signed)
Subjective:    Patient ID: Debbie Hogan, female    DOB: 23-Sep-1951, 60 y.o.   MRN: [id number]  HPI  Debbie Hogan is here for physical examination; she has some questions about her hypertensive medications. Her insurance company changed  10 mg of Benicar to 100 mg of Losartan. She is only been taking a half of  Losartan with good blood pressure control. Specifically her blood pressures ranges  from 110/70-123/78.  She denies chest pain or palpitations. Additionally she has no dyspnea other than that related to her reactive airways disease. She has had intermittent postural lightheadedness without true vertigo. She denies any edema.     She describes good control of her reactive airways disease/ ABBPA  with Advair 250/50 one inhalation once daily. She rarely has to use her rescue agent. She denies paroxysmal nocturnal dyspnea. She is not having significant sputum production. Extrinsic symptoms have not been an issue in her RAD. Past Medical History  Diagnosis Date  . DVT (deep venous thrombosis)     LLE 2005 (post trauma and prolonged air travel), Lovenox x 6 months  . Hypertension   . Colonic polyp 2009  . MVP (mitral valve prolapse)     Dr.Brackbill/Cardiology  . Migraine   . 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 Hyperlipemia     see NMR data   Past Surgical History  Procedure Date  . Oophorectomy     3  benign cysts  . Nasal sinus surgery     polyps/Dr.Rosen 2009  . Tonsillectomy   . Colonoscopy w/ polypectomy     2009    reports that she quit smoking about 30 years ago. She does not have any smokeless tobacco history on file. She reports that she drinks alcohol. She reports that she does not use illicit drugs. family history includes Alcohol abuse in her father; Alzheimer's disease in her paternal uncle; Asthma in her father; Dementia in her father; Depression in her father and mother; Hypertension in her mother and sister; Hypotension in her father;  and Ovarian cancer in her maternal aunt. Allergies  Allergen Reactions  . Montelukast Sodium     REACTION: HEART PALPATATIONS       Review of Systems  Constitutional: Negative.   Eyes: Negative.   Genitourinary: Negative for dysuria, frequency and hematuria.  Neurological: Negative for tremors, numbness and headaches.  Hematological: Negative for adenopathy. Does not bruise/bleed easily.     Positive review of systems includes some tinnitus and intermittent hoarseness.    Wellbutrin has been a valuable tool. Specifically she states that her focus and concentration improved signifiicantly.     Objective:   Physical Exam  Constitutional: She is oriented to person, place, and time. She appears well-nourished.  HENT:  Head: Normocephalic.  Right Ear: External ear normal.  Left Ear: External ear normal.  Nose: Nose normal.  Mouth/Throat: Oropharynx is clear and moist.  Eyes: Conjunctivae are normal. Pupils are equal, round, and reactive to light.  Neck: No thyromegaly present.  Cardiovascular: Normal rate, regular rhythm and normal heart sounds.  Exam reveals no gallop and no friction rub.   No murmur heard. Pulmonary/Chest: Effort normal and breath sounds normal.  Abdominal: Soft. Bowel sounds are normal. She exhibits no mass. There is no tenderness.  Genitourinary:       Dr Nicholas Lose seen annually  Musculoskeletal: She exhibits no edema and no tenderness.  Lymphadenopathy:    She has no cervical adenopathy.  Neurological: She is  alert and oriented to person, place, and time. She has normal reflexes.  Skin: Skin is warm and dry. No rash noted.  Psychiatric: She has a normal mood and affect. Judgment and thought content normal.    The physical examination is essentially negative. I cannot appreciate any significant mitral valve prolapse. Her chest is clear to auscultation with no rhonchi, rales, or wheezing. She has minor tenderness at the proximal  left great toe MCP joint         Assessment & Plan:   #1 comprehensive physical exam essentially negative    #2 allergic bronchopulmonary aspergillosis clinically quiescent ; no suggestion of active reactive airways present at this time    #3 hypertension well controlled ; some symptoms of orthostatic hypotension.      Plan : #1 fasting labs   #2 isometric exercises prior to standing ; monitor blood pressure regularly  through the week. The blood pressure goal should be less than 135/80 on average.

## 2011-01-12 NOTE — Patient Instructions (Signed)
See Assessment & Plan 

## 2011-01-12 NOTE — Progress Notes (Signed)
Subjective:    Patient ID: Debbie Hogan, female    DOB: 08/27/1951, 60 y.o.   MRN: [id number]  HPI  Debbie Hogan is here for physical examination; she has some questions about her hypertensive medications. Her insurance company changed  10 mg of Benicar to 100 mg of Losartan. She is only been taking a half of  Losartan with good blood pressure control. Specifically her blood pressures ranges  from 110/70-123/78.  She denies chest pain or palpitations. Additionally she has no dyspnea other than that related to her reactive airways disease. She has had intermittent postural lightheadedness without true vertigo. She denies any edema.     She describes good control of her reactive airways disease/ ABBPA  with Advair 250/50 one inhalation once daily. She rarely has to use her rescue agent. She denies paroxysmal nocturnal dyspnea. She is not having significant sputum production. Extrinsic symptoms have not been an issue in her RAD. Past Medical History  Diagnosis Date  . DVT (deep venous thrombosis)     LLE 2005 (post trauma and prolonged air travel), Lovenox x 6 months  . Hypertension   . Colonic polyp 2009  . MVP (mitral valve prolapse)     Dr.Brackbill/Cardiology  . Migraine   . 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 Hyperlipemia     see NMR data   Past Surgical History  Procedure Date  . Oophorectomy     3  benign cysts  . Nasal sinus surgery     polyps/Dr.Rosen 2009  . Tonsillectomy   . Colonoscopy w/ polypectomy     2009    reports that she quit smoking about 30 years ago. She does not have any smokeless tobacco history on file. She reports that she drinks alcohol. She reports that she does not use illicit drugs. family history includes Alcohol abuse in her father; Alzheimer's disease in her paternal uncle; Asthma in her father; Dementia in her father; Depression in her father and mother; Hypertension in her mother and sister; Hypotension in her father;  and Ovarian cancer in her maternal aunt. Allergies  Allergen Reactions  . Montelukast Sodium     REACTION: HEART PALPATATIONS       Review of Systems  Constitutional: Negative.   Eyes: Negative.   Genitourinary: Negative for dysuria, frequency and hematuria.  Neurological: Negative for tremors, numbness and headaches.  Hematological: Negative for adenopathy. Does not bruise/bleed easily.     Positive review of systems includes some tinnitus and intermittent hoarseness.    Wellbutrin has been a valuable tool. Specifically she states that her focus and concentration improved signifiicantly.     Objective:   Physical Exam  Constitutional: She is oriented to person, place, and time. She appears well-nourished.  HENT:  Head: Normocephalic.  Right Ear: External ear normal.  Left Ear: External ear normal.  Nose: Nose normal.  Mouth/Throat: Oropharynx is clear and moist.  Eyes: Conjunctivae are normal. Pupils are equal, round, and reactive to light.  Neck: No thyromegaly present.  Cardiovascular: Normal rate, regular rhythm and normal heart sounds.  Exam reveals no gallop and no friction rub.   No murmur heard. Pulmonary/Chest: Effort normal and breath sounds normal.  Abdominal: Soft. Bowel sounds are normal. She exhibits no mass. There is no tenderness.  Genitourinary:       Dr Lomax seen annually  Musculoskeletal: She exhibits no edema and no tenderness.  Lymphadenopathy:    She has no cervical adenopathy.  Neurological: She is   alert and oriented to person, place, and time. She has normal reflexes.  Skin: Skin is warm and dry. No rash noted.  Psychiatric: She has a normal mood and affect. Judgment and thought content normal.    The physical examination is essentially negative. I cannot appreciate any significant mitral valve prolapse. Her chest is clear to auscultation with no rhonchi, rales, or wheezing. She has minor tenderness at the proximal  left great toe MCP joint         Assessment & Plan:   #1 comprehensive physical exam essentially negative    #2 allergic bronchopulmonary aspergillosis clinically quiescent ; no suggestion of active reactive airways present at this time    #3 hypertension well controlled ; some symptoms of orthostatic hypotension.      Plan : #1 fasting labs   #2 isometric exercises prior to standing ; monitor blood pressure regularly  through the week. The blood pressure goal should be less than 135/80 on average. 

## 2011-01-27 ENCOUNTER — Ambulatory Visit: Payer: Self-pay | Admitting: Internal Medicine

## 2011-02-01 ENCOUNTER — Other Ambulatory Visit: Payer: Self-pay | Admitting: Internal Medicine

## 2011-02-02 NOTE — Telephone Encounter (Signed)
Lipids are excellent. No change in Simvastatin , but recheck fasting lipids , hepatic panel, BMET, TSH in  late 01/2011( 272.4, 995.20, 401.9) . Hopp  Copied from 08/18/2010 lab append

## 2011-02-13 ENCOUNTER — Other Ambulatory Visit: Payer: Self-pay | Admitting: *Deleted

## 2011-02-13 MED ORDER — FLUTICASONE-SALMETEROL 250-50 MCG/DOSE IN AEPB: 1.0000 | INHALATION_SPRAY | Freq: Two times a day (BID) | RESPIRATORY_TRACT | Status: DC

## 2011-02-19 ENCOUNTER — Other Ambulatory Visit: Payer: Self-pay | Admitting: Gynecology

## 2011-03-03 ENCOUNTER — Encounter: Payer: Self-pay | Admitting: Internal Medicine

## 2011-03-03 NOTE — Assessment & Plan Note (Signed)
[facility]                             PULMONARY OFFICE NOTE   Debbie Hogan, Debbie Hogan                     MRN:          [id number]  DATE:07/11/2007                            DOB:          1951/04/13    PROBLEM:  1. Allergic bronchopulmonary aspergillosis (atypical).  2. Asthma with mucus plug retention.  3. Deep vein thrombosis (Lovenox for trips).   HISTORY:  For exacerbation she had used an Advair 500 Diskus for 2 weeks  and that strategy worked well.  She can tell if she misses 1 day of her  Advair 100/50.  Dr. Alwyn Ren has suggested a switch to Foradil and Asmanex  and I discussed that with her.  Otherwise, she has felt fairly stable.   MEDICATION:  1. Advair 100/50, using 1 puff each morning.  2. Flonase, 1 spray each nostril.  3. Benicar 10 mg.  4. Vivelle 0.0375 mg patch.  5. Prometrium 200 mg for 12 days out of every third month.  6. Pravastatin 40 mg.  7. Vitamin D 1000 units.  8. Citracal two caplets.  9. Aspirin 162 mg.  10.Mucinex 1200 mg.  11.Loratadine 10 mg.   DRUG INTOLERANT:  SINGULAIR.   OBJECTIVE:  Weight 141.8 pounds, BP 110/70, pulse 87, room air  saturation 95%.  CHEST SOUNDS:  Clear.  HEART SOUNDS:  Regular without murmur.  There is no obvious adenopathy or nasal congestion and seems  comfortable.   IMPRESSION:  Fair control of listed medical problems.   PLAN:  1. Try changing Advair to Foradil 1 puff b.i.d. and Asmanex 1 puff      b.i.d. with prescriptions written.  2. We refilled Lovenox 30 mg/0.3 mL syringe, #10, to use 1 b.i.d. on      long airplane flights.  3. Flu vaccine was discussed and given.  4. Schedule return 6 months, earlier p.r.n.     Clinton D. Maple Hudson, MD, Tonny Bollman, FACP  Electronically Signed    CDY/MedQ  DD: 07/17/2007  DT: 07/17/2007  Job #: [id number]   cc:   Titus Dubin. Alwyn Ren, MD,FACP,FCCP

## 2011-03-03 NOTE — Assessment & Plan Note (Signed)
[facility]                             PULMONARY OFFICE NOTE   ODESTER, NILSON                     MRN:          [id number]  DATE:08/17/2007                            DOB:          05/02/1951    PROBLEM:  1. Allergic bronchopulmonary aspergillosis (atypical), inactive.  2. Asthma with mucus plug retention.  3. Deep vein thrombosis (Lovenox for trips).   HISTORY:  Five days bad sore throat, chest cold syndrome began after air  travel.  She is using Asmanex and Foradil.  Legs ache a little, no  fever.  She did use her Lovenox on the trip.  No blood, no chest pain.  Has had flu vaccine.   MEDICATIONS:  1. Advair 100/50 once each morning.  2. Fluticasone nasal spray, once each nostril.  3. Benicar 10 mg.  4. Vivelle-Dot 0.375 mg patch.  5. Prometrium 200 mg.  6. Pravastatin 40 mg.  7. Vitamin D.  8. Citracal.  9. Aspirin.  10.Mucinex.  11.Loratadine, used p.r.n.   DRUG INTOLERANT SINGULAIR.   OBJECTIVE:  Weight 145 pounds, blood pressure 126/72, pulse 85, room air  saturation 97%.  THROAT:  Does not look bad, there is no thrush, no exudate, no evident  postnasal drainage.  She has a congested cough and wheeze bilaterally.  HEART:  Sounds regular without murmur.   IMPRESSION:  Upper respiratory infection syndrome with exacerbation of  asthma and bronchitis.   PLAN:  Sample Advair 500/50 to use for 2 weeks then she will drop back  to her 100/50, Z-Pak, fluids and rest.  Prednisone 8 day taper to hold  with steroid talk done.  She will keep scheduled appointment, earlier  p.r.n.     Clinton D. Maple Hudson, MD, Tonny Bollman, FACP  Electronically Signed    CDY/MedQ  DD: 08/21/2007  DT: 08/22/2007  Job #: [id number]   cc:   Titus Dubin. Alwyn Ren, MD,FACP,FCCP

## 2011-03-03 NOTE — Op Note (Signed)
Debbie Hogan, Debbie Hogan              ACCOUNT NO.:  [id number]   MEDICAL RECORD NO.:  [id number]          PATIENT TYPE:  AMB   LOCATION:  DSC                          FACILITY:  [facility]   PHYSICIAN:  Jefry H. Pollyann Kennedy, MD     DATE OF BIRTH:  1951-01-17   DATE OF PROCEDURE:  07/23/2008  DATE OF DISCHARGE:                               OPERATIVE REPORT   PREOPERATIVE DIAGNOSES:  1. Nasal and sinus pulposus.  2. Chronic ethmoid sinusitis.  3. Chronic maxillary sinusitis.  4. Chronic frontal sinusitis.   SURGEON:  Jefry H. Pollyann Kennedy, MD   PROCEDURE:  1. Nasal polypectomy bilaterally.  2. Bilateral endoscopic total ethmoidectomy.  3. Bilateral endoscopic maxillary antrostomy with removal of tissue.  4. Bilateral endoscopic frontal sinusotomy.   ANESTHESIA:  General endotracheal anesthesia was used.   COMPLICATIONS:  No complications.   BLOOD LOSS:  100 mL.   FINDINGS:  Diffuse nasal pulposus, worse on the right side.  There is  diffuse polypoid disease present throughout the ethmoid complex  bilaterally.  There is complete obstruction of the frontal duct  bilaterally.  There was inspissated mucus within the frontal sinuses  bilaterally.  There is extensive polypoid disease present within the  maxillary sinuses bilaterally with very thick inspissated mucus as well.  No complications.   REFERRING PHYSICIAN:  Clinton D. Maple Hudson, MD, FCCP, FACP.   HISTORY:  This is a 60 year old lady with a history of bronchopulmonary  aspergillosis and a history of anosmia and chronic sinusitis about 3-1/2  years.  She has not responded to medical therapy.  Risks, benefits,  alternatives, and complications of the procedure were explained to the  patient and seemed to understand and agreed to surgery.   PROCEDURE:  The patient was taken to the  operating room and placed on  the operative table in the supine position.  Following induction of  general endotracheal anesthesia, the patient was prepped and draped  in  the standard fashion.  Afrin spray was used preoperatively.  A 1%  Xylocaine with epinephrine was infiltrated into the superior and  posterior attachments of the middle turbinate on the left and on the  right side the middle turbinate was thinned and medially displaced and  not visible behind the polypoid disease.  An injection was accomplished  to the lateral nasal wall of the septum and part of the middle turbinate  as well as the polypoid mass.  Afrin-soaked pledgets were used in  addition on the right side.   1. Bilateral extensive nasal polypectomy.  Using a 0-degree endoscope      and a microdebrider, nasal polyps were dissected and followed back      into the middle meatus and the infundibulum.  There was extensive      polypoid disease present bilaterally, more so on the right side.      On the right side, it was completely covering the middle turbinate      which was displaced and remodeled as described above.  2. Bilateral endoscopic total ethmoidectomy.  After the nasal      polypectomy was performed,  a 0- and 30-degree nasal endoscope was      used to perform a complete ethmoid dissection, starting on the left      side and then accomplishing the same procedure on the right side.      Polypoid disease was followed all the way up to the fovea      ethmoidalis back through the ground lamella and to the space of the      sphenoid.  The lamina papyracea was kept intact laterally.  There      was extensive polypoid disease and some inspissated mucus      throughout some of the cells.  A complete dissection was      accomplished.  At the termination of the procedure, the ethmoid      cavities were packed with Kyung Rudd packs, then inflated with local      anesthetic solution.  3. Bilateral endoscopic maxillary antrostomy with removal of tissue.      After the initial part of the ethmoid dissection was accomplished,      a 30-degree scope and curved suction was used to enter into  the      maxillary sinus.  The antrostomy was created using a microdebrider      to dissect posteriorly and backbiting forceps to dissect      anteriorly.  A large amount of very thick inspissated mucus was      recovered from both sides.  Thick polypoid tissue was also removed      using a curette and a suction.  After the sinus was completely      cleared bilaterally, no further action was taken.  4. Bilateral endoscopic frontal sinusotomy.  After the ethmoid      dissection was completed anteriorly, the frontal recess was      inspected using 30- and 70-degree nasal endoscopes and curved      suctions.  The frontal sinus duct was more lateral on the right      side than the left, but both sides were off the midline.  The      suction was used to remove thick mucus from the frontal sinus, and      upbiting and giraffe forceps were used to clean off polypoid tissue      from the opening.  Transillumination was performed and there was      excellent lighting of the frontal sinuses.  The pharynx was      suctioned blood and secretions.  The patient was then awakened,      extubated, and transferred to the recovery in stable condition.      Jefry H. Pollyann Kennedy, MD  Electronically Signed     JHR/MEDQ  D:  07/23/2008  T:  07/23/2008  Job:  [id number]   cc:   Joni Fears D. Maple Hudson, MD, FCCP, FACP

## 2011-03-03 NOTE — Assessment & Plan Note (Signed)
[facility]                             PULMONARY OFFICE NOTE   Debbie Hogan, Debbie Hogan                     MRN:          [id number]  DATE:09/01/2007                            DOB:          04-Aug-1951    PROBLEMS:  1. Allergic bronchopulmonary aspergillosis (atypical).  2. Asthma with mucus plug retention.  3. Deep vein thrombosis (Lovenox for trips).   HISTORY:  She is much better after the recent bronchitis exacerbation  that she associated with her travel. This is a scheduled follow up  appointment. Prednisone did help and she has got her sense of smell  back. She had tried a neti pot, but she said that it burned. I suggested  that she use half strength saline mix if she tries it again. She has had  no problem with aspirin despite her history of asthma and nasal polyps.   MEDICATIONS:  Advair has been discontinued. She is now using:  1. Asmanex 1 puff b.i.d.  2. Mucinex b.i.d.  3. Hormones.  4. Flonase.  5. Benicar 5 mg.  6. Foradil 1 b.i.d.  7. Lovenox self-injected for travel.   Drug intolerant SINGULAIR.   OBJECTIVE:  Weight 146 pounds. I think I see a nasal polyp in the right  nare. No periorbital edema or conjunctival injection. Pharynx is clear.  Chest clear. No adenopathy. Heart sounds normal.   IMPRESSION:  Resolving URI/asthmatic bronchitis. Nasal polyposis.  Previous significant problems with mucus plugging and bronchopulmonary  aspergillosis have been inactive.   PLAN:  She is given a sample of Advair 500/50 to hold and a standby  prescription for prednisone 8 day taper in case of problems this winter  as discussed. Suggest Sudafed PE. Schedule return 6 months, earlier  p.r.n.     Clinton D. Maple Hudson, MD, Tonny Bollman, FACP  Electronically Signed    CDY/MedQ  DD: 09/04/2007  DT: 09/05/2007  Job #: [id number]   cc:   Titus Dubin. Alwyn Ren, MD,FACP,FCCP

## 2011-03-06 ENCOUNTER — Ambulatory Visit (INDEPENDENT_AMBULATORY_CARE_PROVIDER_SITE_OTHER)
Admission: RE | Admit: 2011-03-06 | Discharge: 2011-03-06 | Disposition: A | Discharge status: Home / Self Care | Payer: 59 | Source: Ambulatory Visit | Attending: Internal Medicine | Admitting: Internal Medicine

## 2011-03-06 ENCOUNTER — Encounter: Payer: Self-pay | Admitting: Internal Medicine

## 2011-03-06 ENCOUNTER — Ambulatory Visit (INDEPENDENT_AMBULATORY_CARE_PROVIDER_SITE_OTHER): Payer: 59 | Admitting: Internal Medicine

## 2011-03-06 VITALS — BP 116/78 | HR 87 | Ht 65.75 in | Wt 144.4 lb

## 2011-03-06 DIAGNOSIS — J45909 Unspecified asthma, uncomplicated: Secondary | ICD-10-CM

## 2011-03-06 DIAGNOSIS — I82409 Acute embolism and thrombosis of unspecified deep veins of unspecified lower extremity: Secondary | ICD-10-CM

## 2011-03-06 MED ORDER — DOXYCYCLINE HYCLATE 100 MG PO TABS: ORAL_TABLET | ORAL | Status: AC

## 2011-03-06 MED ORDER — ALBUTEROL SULFATE HFA 108 (90 BASE) MCG/ACT IN AERS: 2.0000 | INHALATION_SPRAY | Freq: Four times a day (QID) | RESPIRATORY_TRACT | Status: DC | PRN

## 2011-03-06 NOTE — Assessment & Plan Note (Signed)
[facility]                               PULMONARY OFFICE NOTE   Debbie Hogan, Debbie Hogan                     MRN:          [id number]  DATE:08/16/2006                            DOB:          [birth year]    PULMONARY FOLLOWUP   PROBLEMS:  1. Allergic bronchopulmonary aspergillosis (atypical).  2. Asthma with mucus plug retention.  3. Deep venous thrombosis (Lovenox for trips).   HISTORY:  She took prednisone in a taper in mid-October after 2 weeks of  increased chest congestion described in the previous note.  She now feels  that she is dragging some but she recognizes this as typical of the way she  feels after coming off of her prednisone and expects it to clear.  Otherwise, she feels quite well.  The acute bronchitic flare has resolved.  She can breathe deeply and comfortably with no chest pain or cough.   MEDICATION:  1. Advair 250/50.  2. Mucinex b.i.d.  3. VDOT.  4. Progesterone.  5. Flonase.  6. Lovenox self-injected for travel, mainly for long air flights.   DRUG INTOLERANT OF SINGULAIR.   OBJECTIVE:  Weight 142 pounds.  BP 142/86.  Pulse regular 104.  Room air  saturation 96%.  She looks very comfortable and relaxed.  Breathing is quite clear with good  equal air sounds bilaterally.  Minimal hoarseness with no pharyngeal  erythema or stridor.  No neck vein distention, thyromegaly, or adenopathy.  Heart sounds are regular without murmur.  There is no ankle edema.   IMPRESSION:  Recent bronchitic flare has resolved.  Her chronic asthma and  allergic bronchopulmonary aspergillosis syndrome seem to be inactive and  controlled.   PLAN:  1. Flu vaccine.  2. Pneumococcal vaccine.  3. Schedule a return in 6 months, earlier p.r.n.     Clinton D. Maple Hudson, MD, Tonny Bollman, FACP  Electronically Signed    CDY/MedQ  DD: 08/21/2006  DT: 08/22/2006  Job #: [id number]

## 2011-03-06 NOTE — Assessment & Plan Note (Addendum)
We will refill her meds. She has not been a canidate for Xolair She hasn' t needed rescue inhaler in a long time.

## 2011-03-06 NOTE — Assessment & Plan Note (Signed)
[facility]                             PULMONARY OFFICE NOTE   DRUCELLA, KARBOWSKI                     MRN:          [id number]  DATE:01/06/2007                            DOB:          October 29, 1950    PROBLEM:  1. Allergic bronchopulmonary aspergillosis (atypical).  2. Asthma with mucus plug retention.  3. Deep vein thrombosis (Lovenox for trips).   HISTORY:  Six-month followup.  Has had a very good winter.  She blames  pollen in the last week for feeling a little raspy in he upper airway.  She has had no recent prednisone, but uses her regular medicines without  problems.   MEDICATIONS:  1. Advair 100/50.  2. Mucinex.  3. VDOT hormone patch.  4. Progesterone 12 days per month.  5. Flonase.  6. Benicar 10 mg.  7. Prednisone has been used when needed.  8. Lovenox 20 mg self-injection for travel.  9. Amoxicillin 500 mg 1 hour before dental.   DRUG INTOLERANT TO SINGULAIR.   OBJECTIVE:  Weight 141 pounds.  BP 108/68.  Pulse regular 86.  Room air  saturation 94%.  There is a good deal of white mucus in both nares without visible polyps  or postnasal drainage.  She looks very comfortable with no periorbital edema or conjunctival  injection.  May be a little hoarse, buy pharynx is not red, and there is  no stridor.  LUNG FIELDS:  Are clear.  HEART:  Sounds regular without murmur.   IMPRESSION:  1. Asthma.  Good control.  2. Allergic rhinitis with mild exacerbation.   PLAN:  We discussed Claritin and Sudafed-PE available over the counter.  Schedule return in 6 months, earlier p.r.n.     Clinton D. Maple Hudson, MD, Tonny Bollman, FACP  Electronically Signed   CDY/MedQ  DD: 01/08/2007  DT: 01/08/2007  Job #: [id number]

## 2011-03-06 NOTE — Progress Notes (Signed)
  Subjective:    Patient ID: Debbie Hogan, female    DOB: 07-11-1951, 60 y.o.   MRN: [id number]  HPI 03/06/11- 54 yoF never smoker, followed for asthma, hx DVT, HBP, hx APBA with abnormal cxr. Originally met years ago, presenting with lung nodules of retained mucus- never proved ABPA. Last here- January 28, 2010 after a pneumonia 2 months prior. EOS were 11.6%, down from 28 %.  Currently she says she feels "great" and feels well today. She had used her standby doxy script in the Fall, stopping a sinus infection early.  She comments she has had no sense of smell in years. When on prednisone regularly she got it back for awhile. Flonase wasn't sufficient for this.. Occasional bolus of mucus felt as  postnasal drip. Did not improve after nasal polypectomy. Advair 100 didn't quite hold her, but Advair 250 once daily has done well.  Review of Systems Constitutional:   No weight loss, night sweats,  Fevers, chills, fatigue, lassitude. HEENT:   No headaches,  Difficulty swallowing,  Tooth/dental problems,  Sore throat,                No sneezing, itching, ear ache, nasal congestion CV:  No chest pain,  Orthopnea, PND, swelling in lower extremities, anasarca, dizziness, palpitations  GI  No heartburn, indigestion, abdominal pain, nausea, vomiting, diarrhea, change in bowel habits, loss of appetite  Resp: No shortness of breath with exertion or at rest.  No excess mucus, no productive cough,  No non-productive cough,  No coughing up of blood.  No change in color of mucus.  No wheezing.  Skin: no rash or lesions.  GU: no dysuria, change in color of urine, no urgency or frequency.  No flank pain.  MS:  No joint pain or swelling.  No decreased range of motion.  No back pain.  Psych:  No change in mood or affect. No depression or anxiety.  No memory loss.      Objective:   Physical Exam General- Alert, Oriented, Affect-appropriate, Distress- none acute  Skin- rash-none, lesions- none, excoriation-  none  Lymphadenopathy- none  Head- atraumatic  Eyes- Gross vision intact, PERRLA, conjunctivae clear, secretions  Ears- Hearing, canals, Tm - normal  Nose- Clear,  No- Septal dev, mucus, polyps, erosion, perforation   Throat- Mallampati II , mucosa clear , drainage- none, tonsils- atrophic  Neck- flexible , trachea midline, no stridor , thyroid nl, carotid no bruit  Chest - symmetrical excursion , unlabored     Heart/CV- RRR , no murmur , no gallop  , no rub, nl s1 s2                     - JVD- none , edema- none, stasis changes- none, varices- none     Lung- clear to P&A, wheeze- none, cough- none , dullness-none, rub- none     Chest wall-  Abd- tender-no, distended-no, bowel sounds-present, HSM- no  Br/ Gen/ Rectal- Not done, not indicated  Extrem- cyanosis- none, clubbing, none, atrophy- none, strength- nl  Neuro- grossly intact to observation         Assessment & Plan:

## 2011-03-06 NOTE — Assessment & Plan Note (Signed)
[facility]                               PULMONARY OFFICE NOTE   SEPHORA, BOYAR                     MRN:          [id number]  DATE:08/06/2006                            DOB:          10/21/1950    FOLLOWUP   PROBLEMS:  1. Allergic bronchopulmonary aspergillosis (atypical).  2. Asthma with mucous plug retention.  3. Deep vein thrombosis (Lovenox for trips).   HISTORY:  She has done well through the summer.  In the last 2 weeks, she  had had increased shortness of breath and was taking more fluids, using her  albuterol 2-3 times a day and finally went to an Urgent Care about 3 days  ago.  They put her on prednisone of which she has taken 40 mg a day for the  last 3 days, Levaquin and gave a neb treatment.  She feels she is beginning  to do somewhat better because her cough is breaking loose and she is  bringing up some green sputum.  There has not been definite fever.  She is  using her peak flow meter.  Normal good days are 350 to 360 and with this  illness, she dropped to 240.   MEDICATIONS:  1. Advair 100/50 b.i.d.  2. Mucinex b.i.d.  3. VDOT hormone patch.  4. Progesterone.  5. Flonase.  6. Prednisone.  7. Lovenox used p.r.n. for travel.   OBJECTIVE:  VITAL SIGNS:  Weight 140 pounds.  Temperature 98.9.  BP 146/92.  Pulse regular at 87.  Room air saturation 94%.  GENERAL:  She looks relaxed and comfortable.  No obvious rash or adenopathy.  LUNGS:  There is bilateral expiratory wheeze without laboring.  No dullness.  HEART:  Heart sounds are regular without murmur.  EXTREMITIES:  There is no peripheral edema.   IMPRESSION:  Exacerbation of asthma and bronchitis.  Background history as  noted of atypical allergic bronchopulmonary aspergillosis, mucous plug  retention and deep vein thrombosis.   PLAN:  1. Prednisone tapering using 5 mg tablets 6, 5, 4, 3, 2, and 1.  2. Sample Advair 500/50, 2 puffs b.i.d. for 2 weeks and then  drop back to      her routine dose.  3. Finish Levaquin.  4. Keep existing appointment for about 2 weeks from now and anticipate we      will give flu and Pneumococcal vaccine then as the acute exacerbation      fades.       Clinton D. Maple Hudson, MD, FCCP, FACP      CDY/MedQ  DD:  08/06/2006  DT:  08/09/2006  Job #:  [id number]

## 2011-03-06 NOTE — Op Note (Signed)
NAMEJILL, Debbie Hogan                           ACCOUNT NO.:  [id number]   MEDICAL RECORD NO.:  [id number]                   PATIENT TYPE:  AMB   LOCATION:  ENDO                                 FACILITY:  [facility]   PHYSICIAN:  Clinton D. Maple Hudson, M.D.              DATE OF BIRTH:  May 02, 1951   DATE OF PROCEDURE:  09/07/2003  DATE OF DISCHARGE:                                 OPERATIVE REPORT   INDICATIONS FOR PROCEDURE:  60 year old woman with a remote smoking history  and late-onset asthma symptoms, found on chest x-ray to have multiple  nodules, confirmed on CT scan.  CT appearance suggests inspissated mucus,  and question of allergic bronchopulmonary aspergillosis has been raised.  IgE is elevated, but specific Aspergillus serologies so far are negative.  Preoperative examination was significant for a regular heart rhythm with  normal vital signs, unlabored breathing, no adenopathy, a few scattered  crackles, and minimal wheeze.  No edema, clubbing.  Clinically stable for  procedure.  Bronchoscopy is performed with particular interest in obtaining  culture samples from areas of mucous plugging.   DESCRIPTION OF PROCEDURE:  After fully informed consent, bronchoscopy was  performed on an outpatient basis in the endoscopy suite.  Premedication was  with Demerol and Atropine.  The upper airway was anesthetized topically with  2% Xylocaine and Cetacaine spray.  Oxygen was provided at 8 liters per  minute by nasal prongs, holding the saturation over 92%.  The EKG showed  normal sinus rhythm.  An Olympus fiberoptic bronchoscope was advanced via  the right nostril to the level of the vocal cords without difficulty.  The  cords and trachea were normal.  The main carina was normal.  An accessory  right upper lobe orifice extended from the lateral right tracheal wall just  above the level of the main carina.  Otherwise, endobronchial anatomy was  unremarkable.  The mucosa was normal, and there was  no evidence of foreign  material.  In the dependent airways distally, there was inspissated,  rubbery, white mucus.  When suctioned, this drew out in long bands,  containing small white to yellow granular or crystal-like material.  There  was no blood.  Saline lavage helped only a little to dislodge these plugs.  Brushing was performed in the left lower lobe.  A bronchial mucosal biopsy  was attempted near the base of one of the airways containing mucous plugs,  but when trivial samples were obtained, this was stopped as not likely to be  significant, and no samples were submitted.  She tolerated the procedure  very well and was being held until stable and then released home with family  to office followup.   IMPRESSION:  Inspissated secretions.  This may be a chronic, nonspecific  asthma or an allergic bronchopulmonary aspergillosis or similar fungal  reactive process.  Clinton D. Maple Hudson, M.D.    CDY/MEDQ  D:  09/07/2003  T:  09/08/2003  Job:  [id number]

## 2011-03-06 NOTE — Assessment & Plan Note (Addendum)
This related to her hx of  ? APBA and to her nasal polyps. We will continue to track, but she doesn't need different intervention.

## 2011-03-06 NOTE — Patient Instructions (Addendum)
Sample  X 2 Advair 100  Try 1 puff twice daily and rinse mouth  If you feel stable and comfortable let me know and I will write a script for that strength, otherwise go back to the 250.  Meds refilled.  Order- CXR  - dx asthma

## 2011-03-11 ENCOUNTER — Encounter: Payer: Self-pay | Admitting: Internal Medicine

## 2011-03-31 ENCOUNTER — Telehealth: Payer: Self-pay | Admitting: Internal Medicine

## 2011-03-31 NOTE — Telephone Encounter (Signed)
Called and spoke with pt. Pt aware of cxr results.  Pt verbalized understanding and denied any questions.

## 2011-03-31 NOTE — Telephone Encounter (Signed)
Looks like Debbie Hogan was calling her to inform her of cxr results, which state..... CXR- clear chest x ray, no active process and no concerns.  [id number]

## 2011-04-24 ENCOUNTER — Other Ambulatory Visit: Payer: Self-pay | Admitting: Internal Medicine

## 2011-04-24 NOTE — Telephone Encounter (Signed)
To be cautious recheck fasting AST and ALT in 4 months (790.4). Hopp  Copied from 12/2010 labs, labs due 04/2011

## 2011-05-04 ENCOUNTER — Other Ambulatory Visit: Payer: Self-pay | Admitting: Gynecology

## 2011-05-04 DIAGNOSIS — Z1231 Encounter for screening mammogram for malignant neoplasm of breast: Secondary | ICD-10-CM

## 2011-05-08 ENCOUNTER — Ambulatory Visit
Admission: RE | Admit: 2011-05-08 | Discharge: 2011-05-08 | Disposition: A | Discharge status: Home / Self Care | Payer: 59 | Source: Ambulatory Visit | Attending: Gynecology | Admitting: Gynecology

## 2011-05-08 DIAGNOSIS — Z1231 Encounter for screening mammogram for malignant neoplasm of breast: Secondary | ICD-10-CM

## 2011-05-25 ENCOUNTER — Telehealth: Payer: Self-pay

## 2011-05-25 ENCOUNTER — Emergency Department (HOSPITAL_BASED_OUTPATIENT_CLINIC_OR_DEPARTMENT_OTHER)
Admission: EM | Admit: 2011-05-25 | Discharge: 2011-05-25 | Disposition: A | Discharge status: Home / Self Care | Payer: 59 | Attending: Emergency Medicine | Admitting: Emergency Medicine

## 2011-05-25 ENCOUNTER — Other Ambulatory Visit: Payer: Self-pay

## 2011-05-25 ENCOUNTER — Encounter (HOSPITAL_BASED_OUTPATIENT_CLINIC_OR_DEPARTMENT_OTHER): Payer: Self-pay | Admitting: *Deleted

## 2011-05-25 ENCOUNTER — Emergency Department (INDEPENDENT_AMBULATORY_CARE_PROVIDER_SITE_OTHER): Payer: 59

## 2011-05-25 DIAGNOSIS — I10 Essential (primary) hypertension: Secondary | ICD-10-CM | POA: Insufficient documentation

## 2011-05-25 DIAGNOSIS — E785 Hyperlipidemia, unspecified: Secondary | ICD-10-CM | POA: Insufficient documentation

## 2011-05-25 DIAGNOSIS — Z86718 Personal history of other venous thrombosis and embolism: Secondary | ICD-10-CM | POA: Insufficient documentation

## 2011-05-25 DIAGNOSIS — R079 Chest pain, unspecified: Secondary | ICD-10-CM

## 2011-05-25 LAB — COMPREHENSIVE METABOLIC PANEL
Alkaline Phosphatase: 47 U/L (ref 39–117)
BUN: 18 mg/dL (ref 6–23)
Calcium: 9.5 mg/dL (ref 8.4–10.5)
Creatinine, Ser: 0.5 mg/dL (ref 0.50–1.10)
GFR calc Af Amer: >60 mL/min (ref >60)
Glucose, Bld: 86 mg/dL (ref 70–99)
Potassium: 4.1 mEq/L (ref 3.5–5.1)
Total Protein: 7.1 g/dL (ref 6.0–8.3)

## 2011-05-25 LAB — CARDIAC PANEL(CRET KIN+CKTOT+MB+TROPI)
CK, MB: 2.5 ng/mL (ref 0.3–4.0)
Relative Index: INVALID (ref 0.0–2.5)
Relative Index: INVALID (ref 0.0–2.5)
Total CK: 81 U/L (ref 7–177)

## 2011-05-25 LAB — CBC
HCT: 41.4 % (ref 36.0–46.0)
Hemoglobin: 13.9 g/dL (ref 12.0–15.0)
MCH: 30.3 pg (ref 26.0–34.0)
MCHC: 33.6 g/dL (ref 30.0–36.0)
MCV: 90.4 fL (ref 78.0–100.0)
RDW: 13.4 % (ref 11.5–15.5)

## 2011-05-25 NOTE — Telephone Encounter (Signed)
Call transferred from Sarah---patient c/o left chest pain under the breast. She advised me she had been having this weird feeling in her chest and not sure what it is. Patient has seen Cardiologist in the past but does not now. She stated she felt fine but the feeling was not right. I advised that patient to go to the ED, I advised we should not take any chances and she agreed, she stated she would feel better going rather than waiting until tomorrow afternoon appt and advised that would be good. She stated she would keep  [place] posted     KP

## 2011-05-25 NOTE — ED Notes (Signed)
the patient states three days ago she had a sudden onset of chest pain underneath the left breast. Pain was intermittent, sharp lasting about five seconds. the patient denies any radiation of pain to jaw or left arm. the patient complains of intermittent tingling in her right arm. the patient denies any shortness of breath. the patient denies nausea or vomiting. The patient has a history of of deep vein thrombosis from a long flight

## 2011-05-25 NOTE — ED Provider Notes (Signed)
History     CSN: [id number] Arrival date & time: 05/25/2011  5:53 PM  Chief Complaint  Patient presents with  . Abdominal Pain   Patient is a 60 y.o. female presenting with chest pain. The history is provided by the patient.  Chest Pain The chest pain began yesterday. Chest pain occurs constantly. The chest pain is worsening. At its most intense, the pain is at 10/10. The severity of the pain is severe. The quality of the pain is described as sharp, brief and stabbing. The pain does not radiate. Pertinent negatives for primary symptoms include no fever, no fatigue, no shortness of breath, no cough and no nausea.  Pertinent negatives for associated symptoms include no claudication, no diaphoresis, no lower extremity edema, no near-syncope, no numbness, no orthopnea, no paroxysmal nocturnal dyspnea and no weakness. She tried nothing for the symptoms.  Her past medical history is significant for hyperlipidemia and hypertension.   t reports she has had pain on and off since yesterday.  Pt complains of severe pain.  Pt is sharp and stabbing.  Pt last for a second of less.  Past Medical History  Diagnosis Date  . DVT (deep venous thrombosis)     LLE 2005 (post trauma and prolonged air travel), Lovenox x 6 months  . Hypertension   . Colonic polyp 2009  . MVP (mitral valve prolapse)     Dr.Brackbill/Cardiology  . Migraine   . 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 Hyperlipemia     see NMR data    Past Surgical History  Procedure Date  . Oophorectomy     3  benign cysts  . Nasal sinus surgery     polyps/Dr.Rosen 2009  . Tonsillectomy   . Colonoscopy w/ polypectomy     2009    Family History  Problem Relation Age of Onset  . Dementia Father   . Asthma Father   . Hypertension Mother     MGM   . Depression Mother     2 sisters  . Hypertension Sister   . Depression Father     2 sisters  . Alcohol abuse Father     2 sisters  . Alzheimer's disease  Paternal Uncle   . Hypotension Father   . Ovarian cancer Maternal Aunt     History  Substance Use Topics  . Smoking status: Former Smoker    Quit date: 10/19/1980  . Smokeless tobacco: Not on file   Comment: Positive history of passive tobacco smoke exposure until 1990  . Alcohol Use: Yes     Social alcohol 1 x weekly    OB History    Grav Para Term Preterm Abortions TAB SAB Ect Mult Living                  Review of Systems  Constitutional: Negative for fever, diaphoresis and fatigue.  Respiratory: Negative for cough and shortness of breath.   Cardiovascular: Positive for chest pain. Negative for orthopnea, claudication and near-syncope.  Gastrointestinal: Negative for nausea.  Neurological: Negative for weakness and numbness.  All other systems reviewed and are negative.    Physical Exam  BP 135/78  Pulse 76  Temp(Src) 99.1 F (37.3 C) (Oral)  Resp 20  SpO2 97%  Physical Exam  Nursing note and vitals reviewed. Constitutional: She is oriented to person, place, and time. She appears well-developed and well-nourished.  HENT:  Head: Normocephalic and atraumatic.  Right Ear: External ear normal.  Eyes: Conjunctivae and EOM are normal. Pupils are equal, round, and reactive to light.  Neck: Normal range of motion. Neck supple.  Cardiovascular: Normal rate.   Pulmonary/Chest: Effort normal.  Abdominal: Soft.  Musculoskeletal: Normal range of motion.  Neurological: She is alert and oriented to person, place, and time. She has normal reflexes.  Skin: Skin is warm and dry.  Psychiatric: She has a normal mood and affect.    ED Course  Procedures  MDM   I did 2 sets of cardiac markers,  Eck is normal.  I spoke to Dr. Mayford Knife Cardiology who advised he will have cardiology call pt tommorow to be seen for evaluation.      Langston Masker, [place] 05/27/11 1616  [place], [place] 05/27/11 1616

## 2011-05-25 NOTE — ED Notes (Signed)
Call placed to [facility]---Dr. Mayford Knife on call

## 2011-05-25 NOTE — ED Notes (Signed)
Epigastric pain x 3 days. Feels like a spasm. Took ASA.

## 2011-05-26 NOTE — ED Provider Notes (Addendum)
History     CSN: [id number] Arrival date & time: 05/25/2011  5:53 PM  Chief Complaint  Patient presents with  . Abdominal Pain   HPI  Past Medical History  Diagnosis Date  . DVT (deep venous thrombosis)     LLE 2005 (post trauma and prolonged air travel), Lovenox x 6 months  . Hypertension   . Colonic polyp 2009  . MVP (mitral valve prolapse)     Dr.Brackbill/Cardiology  . Migraine   . 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 Hyperlipemia     see NMR data    Past Surgical History  Procedure Date  . Oophorectomy     3  benign cysts  . Nasal sinus surgery     polyps/Dr.Rosen 2009  . Tonsillectomy   . Colonoscopy w/ polypectomy     2009    Family History  Problem Relation Age of Onset  . Dementia Father   . Asthma Father   . Hypertension Mother     MGM   . Depression Mother     2 sisters  . Hypertension Sister   . Depression Father     2 sisters  . Alcohol abuse Father     2 sisters  . Alzheimer's disease Paternal Uncle   . Hypotension Father   . Ovarian cancer Maternal Aunt     History  Substance Use Topics  . Smoking status: Former Smoker    Quit date: 10/19/1980  . Smokeless tobacco: Not on file   Comment: Positive history of passive tobacco smoke exposure until 1990  . Alcohol Use: Yes     Social alcohol 1 x weekly    OB History    Grav Para Term Preterm Abortions TAB SAB Ect Mult Living                  Review of Systems  Physical Exam  BP 134/80  Pulse 71  Temp(Src) 99.1 F (37.3 C) (Oral)  Resp 20  SpO2 99%  Physical Exam  ED Course  Procedures  MDM    Date: 06/30/2011  Rate: 77  Rhythm: normal sinus rhythm  QRS Axis: normal  Intervals: normal  ST/T Wave abnormalities: normal  Conduction Disutrbances:none    Medical screening examination/treatment/procedure(s) were performed by non-physician practitioner and as supervising physician I was immediately available for  consultation/collaboration.       Forbes Cellar, MD 05/26/11 2130  Forbes Cellar, MD 06/30/11 Rickey Primus

## 2011-05-27 ENCOUNTER — Ambulatory Visit: Payer: 59 | Admitting: Internal Medicine

## 2011-07-20 LAB — BASIC METABOLIC PANEL
BUN: 13
CO2: 31
Chloride: 105
Potassium: 4.3

## 2011-07-20 LAB — POCT HEMOGLOBIN-HEMACUE: Hemoglobin: 13.7

## 2011-08-11 ENCOUNTER — Other Ambulatory Visit: Payer: Self-pay | Admitting: Internal Medicine

## 2011-08-11 DIAGNOSIS — E785 Hyperlipidemia, unspecified: Secondary | ICD-10-CM

## 2011-08-12 ENCOUNTER — Other Ambulatory Visit (INDEPENDENT_AMBULATORY_CARE_PROVIDER_SITE_OTHER): Payer: 59

## 2011-08-12 DIAGNOSIS — E785 Hyperlipidemia, unspecified: Secondary | ICD-10-CM

## 2011-08-12 LAB — LIPID PANEL
Cholesterol: 155 mg/dL (ref 0–200)
HDL: 61.9 mg/dL (ref >39.00)
VLDL: 18.2 mg/dL (ref 0.0–40.0)

## 2011-08-12 LAB — ALT: ALT: 17 U/L (ref 0–35)

## 2011-08-12 NOTE — Progress Notes (Signed)
Labs only

## 2011-09-14 ENCOUNTER — Other Ambulatory Visit: Payer: Self-pay | Admitting: Internal Medicine

## 2011-09-14 ENCOUNTER — Telehealth: Payer: Self-pay | Admitting: Internal Medicine

## 2011-09-14 MED ORDER — BUPROPION HCL ER (XL) 300 MG PO TB24: 300.0000 mg | ORAL_TABLET | Freq: Every day | ORAL | Status: DC

## 2011-09-14 MED ORDER — FLUTICASONE-SALMETEROL 250-50 MCG/DOSE IN AEPB: 1.0000 | INHALATION_SPRAY | Freq: Two times a day (BID) | RESPIRATORY_TRACT | Status: DC

## 2011-09-14 MED ORDER — SIMVASTATIN 40 MG PO TABS: 40.0000 mg | ORAL_TABLET | Freq: Every day | ORAL | Status: DC

## 2011-09-14 MED ORDER — FLUTICASONE PROPIONATE 50 MCG/ACT NA SUSP: 1.0000 | Freq: Every day | NASAL | Status: DC

## 2011-09-14 MED ORDER — LOSARTAN POTASSIUM 100 MG PO TABS: 33.0000 mg | ORAL_TABLET | Freq: Every day | ORAL | Status: DC

## 2011-09-14 NOTE — Telephone Encounter (Signed)
LMOMTCB x 1 

## 2011-09-14 NOTE — Telephone Encounter (Signed)
PATIENT RETURNED LORI'S CALL

## 2011-09-14 NOTE — Telephone Encounter (Signed)
rx's manually faxed.

## 2011-09-14 NOTE — Telephone Encounter (Signed)
Pt request refills for Advair and Flonase be sent to [facility] for 90 day supply. Pt is scheduled for follow-up with CDY in May 2013.

## 2011-09-15 ENCOUNTER — Other Ambulatory Visit: Payer: Self-pay | Admitting: Internal Medicine

## 2011-09-17 NOTE — Telephone Encounter (Signed)
Filled on 09/14/11

## 2011-09-18 ENCOUNTER — Other Ambulatory Visit: Payer: Self-pay

## 2011-09-18 MED ORDER — BUPROPION HCL ER (XL) 300 MG PO TB24: 300.0000 mg | ORAL_TABLET | Freq: Every day | ORAL | Status: DC

## 2011-09-18 NOTE — Telephone Encounter (Signed)
RX sent

## 2011-10-02 ENCOUNTER — Other Ambulatory Visit: Payer: Self-pay | Admitting: Allergy

## 2011-10-02 MED ORDER — FLUTICASONE-SALMETEROL 250-50 MCG/DOSE IN AEPB: 1.0000 | INHALATION_SPRAY | Freq: Two times a day (BID) | RESPIRATORY_TRACT | Status: DC

## 2011-10-02 MED ORDER — FLUTICASONE PROPIONATE 50 MCG/ACT NA SUSP: 1.0000 | Freq: Every day | NASAL | Status: DC

## 2011-10-27 ENCOUNTER — Other Ambulatory Visit: Payer: Self-pay | Admitting: Internal Medicine

## 2011-10-29 ENCOUNTER — Telehealth: Payer: Self-pay | Admitting: Internal Medicine

## 2011-10-29 MED ORDER — DOXYCYCLINE HYCLATE 100 MG PO TABS: ORAL_TABLET | ORAL | Status: DC

## 2011-10-29 NOTE — Telephone Encounter (Signed)
Called and spoke with pt. Pt states she has a hx of aspergillosis bronchopulmonary and CY always allows her to have doxycycline with refills on hand in case she needs it.  Pt states her old rx expired and therefore needs a new rx.  Pt c/o "cold/flu like symptom" x 14 days.  C/o head and chest congestion, coughing up large quantities of green sputum and green nasal drainage.  Denies f/c/s or increased sob.  CY, please advise.  Thanks. Allergies  Allergen Reactions  . Montelukast Sodium Palpitations    Irregular heart beat

## 2011-10-29 NOTE — Telephone Encounter (Signed)
Per CY-okay to give Doxycycline 100 mg #8 take 2 today then 1 daily until gone with 5 refills.

## 2011-10-29 NOTE — Telephone Encounter (Signed)
RX has been sent and pt aware 

## 2011-10-29 NOTE — Telephone Encounter (Signed)
Last ov with CDY 5.8.12, was given doxycycline 100mg  #8 with 3 refills at that ov.  Is pt sick?  LMOM TCB x1.

## 2011-10-29 NOTE — Telephone Encounter (Signed)
Pt returned triage's cal & can be reached at [phone].  Debbie Hogan

## 2012-01-10 ENCOUNTER — Other Ambulatory Visit: Payer: Self-pay | Admitting: Internal Medicine

## 2012-01-11 NOTE — Telephone Encounter (Signed)
Patient needs to schedule a CPX  

## 2012-03-08 ENCOUNTER — Ambulatory Visit (INDEPENDENT_AMBULATORY_CARE_PROVIDER_SITE_OTHER): Admitting: Internal Medicine

## 2012-03-08 ENCOUNTER — Encounter: Payer: Self-pay | Admitting: Internal Medicine

## 2012-03-08 VITALS — BP 122/80 | HR 65 | Ht 65.75 in | Wt 143.4 lb

## 2012-03-08 DIAGNOSIS — B4481 Allergic bronchopulmonary aspergillosis: Secondary | ICD-10-CM

## 2012-03-08 DIAGNOSIS — I82409 Acute embolism and thrombosis of unspecified deep veins of unspecified lower extremity: Secondary | ICD-10-CM

## 2012-03-08 DIAGNOSIS — J45909 Unspecified asthma, uncomplicated: Secondary | ICD-10-CM

## 2012-03-08 DIAGNOSIS — Z8709 Personal history of other diseases of the respiratory system: Secondary | ICD-10-CM | POA: Insufficient documentation

## 2012-03-08 MED ORDER — FLUTICASONE-SALMETEROL 100-50 MCG/DOSE IN AEPB: INHALATION_SPRAY | RESPIRATORY_TRACT | Status: DC

## 2012-03-08 MED ORDER — ALBUTEROL SULFATE HFA 108 (90 BASE) MCG/ACT IN AERS: 2.0000 | INHALATION_SPRAY | Freq: Four times a day (QID) | RESPIRATORY_TRACT | Status: DC | PRN

## 2012-03-08 NOTE — Assessment & Plan Note (Signed)
She tolerates maintenance flonase, but has chronic anosmia despite surgical polypectomy and has visible polyps now. Suggested brief trial of Afrin to assess effect on sense of smell. Continue flonase.

## 2012-03-08 NOTE — Assessment & Plan Note (Addendum)
Well controlled still aware of mild wheeze occasionally, not needing rescue medication Plan trial reduction to Advair 100,

## 2012-03-08 NOTE — Assessment & Plan Note (Signed)
In long term remission with no recurrence of mucus plugging.

## 2012-03-08 NOTE — Patient Instructions (Signed)
We discussed trying to use the Flonase: 2 puffs in right nostril and one puff in left nostril daily to suppress polyps  Script for Advair 100 to continue 1 puff then rinse mouth, once (or twice) daily  Please call as needed  We will refill doxycycline to hold, as needed

## 2012-03-08 NOTE — Progress Notes (Signed)
Subjective:    Patient ID: Debbie Hogan, female    DOB: 07/10/1951, 61 y.o.   MRN: [id number]  HPI 03/06/11- 21 yoF never smoker, followed for asthma, hx DVT, HBP, hx APBA with abnormal cxr. Originally met years ago, presenting with lung nodules of retained mucus- never proved ABPA. Last here- January 28, 2010 after a pneumonia 2 months prior. EOS were 11.6%, down from 28 %.  Currently she says she feels "great" and feels well today. She had used her standby doxy script in the Fall, stopping a sinus infection early.  She comments she has had no sense of smell in years. When on prednisone regularly she got it back for awhile. Flonase wasn't sufficient for this.. Occasional bolus of mucus felt as  postnasal drip. Did not improve after nasal polypectomy. Advair 100 didn't quite hold her, but Advair 250 once daily has done well.   03/08/12- 61 yoF never smoker, followed for asthma, hx DVT, HBP, hx APBA with abnormal cxr, nasal polyps. Originally met years ago, presenting with lung nodules of retained mucus- never proved ABPA. We reviewed old CXR reports from first in 2004 to complete clearing. Some tightness if skips her once daily Advair 250- interested in trying Advair 100. Denies nasal congestion as she continues Flonase w/o epistaxis. Hx nasal polypectomy, no aspirin allergy. No recurrence of DVT  ROS-see HPI Constitutional:   No-   weight loss, night sweats, fevers, chills, fatigue, lassitude. HEENT:   No-  headaches, difficulty swallowing, tooth/dental problems, sore throat,       No-  sneezing, itching, ear ache, nasal congestion, post nasal drip,  CV:  No-   chest pain, orthopnea, PND, swelling in lower extremities, anasarca,   dizziness, palpitations Resp: No-   shortness of breath with exertion or at rest.              No-   productive cough,  No non-productive cough,  No- coughing up of blood.              No-   change in color of mucus.  No- wheezing.   Skin: No-   rash or lesions. GI:   No-   heartburn, indigestion, abdominal pain, nausea, vomiting,  GU:  MS:  No-   joint pain or swelling.   Neuro-     nothing unusual Psych:  No- change in mood or affect. No depression or anxiety.  No memory loss.  OBJ- Physical Exam General- Alert, Oriented, Affect-appropriate, Distress- none acute, WDWN Skin- rash-none, lesions- none, excoriation- none Lymphadenopathy- none Head- atraumatic            Eyes- Gross vision intact, PERRLA, conjunctivae and secretions clear            Ears- Hearing, canals-normal            Nose- +Nasal polyp on right, no-Septal dev, mucus, erosion, perforation             Throat- Mallampati II , mucosa clear , drainage- none, tonsils- atrophic Neck- flexible , trachea midline, no stridor , thyroid nl, carotid no bruit Chest - symmetrical excursion , unlabored           Heart/CV- RRR , no murmur , no gallop  , no rub, nl s1 s2                           - JVD- none , edema- none, stasis changes- none, varices- none  Lung- clear to P&A, wheeze- none, cough- none , dullness-none, rub- none           Chest wall-  Abd-  Br/ Gen/ Rectal- Not done, not indicated Extrem- cyanosis- none, clubbing, none, atrophy- none, strength- nl Neuro- grossly intact to observation

## 2012-03-24 ENCOUNTER — Other Ambulatory Visit: Payer: Self-pay | Admitting: Gynecology

## 2012-04-28 ENCOUNTER — Other Ambulatory Visit: Payer: Self-pay | Admitting: Internal Medicine

## 2012-04-28 NOTE — Telephone Encounter (Signed)
Patient needs to schedule a CPX  

## 2012-05-11 ENCOUNTER — Other Ambulatory Visit: Payer: Self-pay | Admitting: Gynecology

## 2012-05-11 DIAGNOSIS — Z1231 Encounter for screening mammogram for malignant neoplasm of breast: Secondary | ICD-10-CM

## 2012-05-25 ENCOUNTER — Ambulatory Visit
Admission: RE | Admit: 2012-05-25 | Discharge: 2012-05-25 | Disposition: A | Discharge status: Home / Self Care | Source: Ambulatory Visit | Attending: Gynecology | Admitting: Gynecology

## 2012-05-25 DIAGNOSIS — Z1231 Encounter for screening mammogram for malignant neoplasm of breast: Secondary | ICD-10-CM

## 2012-08-05 ENCOUNTER — Other Ambulatory Visit: Payer: Self-pay | Admitting: Internal Medicine

## 2012-08-05 NOTE — Telephone Encounter (Signed)
Apt pending 08/09/12----- KP

## 2012-08-09 ENCOUNTER — Ambulatory Visit (INDEPENDENT_AMBULATORY_CARE_PROVIDER_SITE_OTHER)
Admission: RE | Admit: 2012-08-09 | Discharge: 2012-08-09 | Disposition: A | Discharge status: Home / Self Care | Source: Ambulatory Visit | Attending: Internal Medicine | Admitting: Internal Medicine

## 2012-08-09 ENCOUNTER — Encounter: Payer: Self-pay | Admitting: Internal Medicine

## 2012-08-09 ENCOUNTER — Ambulatory Visit (INDEPENDENT_AMBULATORY_CARE_PROVIDER_SITE_OTHER): Admitting: Internal Medicine

## 2012-08-09 VITALS — BP 116/80 | HR 75 | Temp 98.1°F | Wt 142.8 lb

## 2012-08-09 DIAGNOSIS — M940 Chondrocostal junction syndrome [Tietze]: Secondary | ICD-10-CM

## 2012-08-09 DIAGNOSIS — Z23 Encounter for immunization: Secondary | ICD-10-CM

## 2012-08-09 NOTE — Patient Instructions (Addendum)
Consider glucosamine sulfate 1500 mg daily for rib cage  symptoms. Take this daily  for 3 months and then leave it off for 2 months. This will rehydrate the cartilages. Use an anti-inflammatory cream such as Aspercreme or Zostrix cream twice a day to the left chest area as needed. In lieu of this warm moist compresses or  hot water bottle can be used. Do not apply ice.   If you activate My Chart; the results can be released to you as soon as they populate from the lab. If you choose not to use this program; the labs have to be reviewed, copied & mailed   causing a delay in getting the results to you.

## 2012-08-09 NOTE — Progress Notes (Signed)
  Subjective:    Patient ID: Debbie Hogan, female    DOB: 22-Mar-1951, 61 y.o.   MRN: [id number]  HPI Symptoms have been present for several years but have been exacerbated since she entered a exercise program (body pump) in August of this year. She describes a sensation of a "pulled muscle" in the LL rib cage area with position change or cough. There was a significant exacerbation with a recent episode of bronchitis. Specifically slight waist flexion will cause the symptoms.  She did have deep venous thrombosis in 2007; she states that she was on Lovenox for 6 months. She's taking it prophylactically on 2 occasions since with international travel. She is not had a pulmonary embolus.    Review of Systems  Her cough is nonproductive and stable as is her asthma. She denies fever, chills, sweats, or weight loss. She's had no significant sputum production and no hemoptysis.  There's been no swelling or pain in her calves.  She questions a sensation of a lymph node in the right neck area     Objective:   Physical Exam General appearance: thin but in good health ;well nourished; no acute distress or increased work of breathing is present.  No  lymphadenopathy about the head, neck, or axilla noted.   Eyes: No conjunctival inflammation or lid edema is present.   Ears:  External ear exam shows no significant lesions or deformities.  Otoscopic examination reveals wax bilaterally  Nose:  External nasal examination shows no deformity or inflammation. Nasal mucosa are pink and moist without lesions or exudates. No septal dislocation or deviation.No obstruction to airflow.   Oral exam: Dental hygiene is good; lips and gums are healthy appearing.There is no oropharyngeal erythema or exudate noted.   Neck:  No deformities, thyromegaly, masses, or tenderness noted.   Supple with full range of motion without pain.   Heart:  Normal rate and regular rhythm. S1 and S2 normal without gallop, murmur, click,  rub or other extra sounds.   Lungs:Chest clear to auscultation; no wheezes, rhonchi,rales ,or rubs present.No increased work of breathing.  She exhibits classic costochondritis symptoms with palpation of the left inferior rib cage  Extremities:  No cyanosis, edema, or clubbing  noted . Homans sign is negative bilaterally   Skin: Warm & dry          Assessment & Plan:  #1 costochondral chest pain  #2 past history of deep venous ecchymosis; clinically there is nothing to suggest recurrent deep venous thrombosis/pulmonary thromboemboli  Plan: Chest x-ray will be performed as a baseline. She will have to adapt her activities to prevent recurrence.

## 2012-08-15 ENCOUNTER — Encounter: Payer: Self-pay | Admitting: Internal Medicine

## 2012-08-22 ENCOUNTER — Other Ambulatory Visit: Payer: Self-pay

## 2012-08-22 ENCOUNTER — Encounter: Payer: Self-pay | Admitting: Internal Medicine

## 2012-08-22 ENCOUNTER — Ambulatory Visit (INDEPENDENT_AMBULATORY_CARE_PROVIDER_SITE_OTHER): Admitting: Internal Medicine

## 2012-08-22 VITALS — BP 120/82 | HR 86 | Temp 98.2°F | Resp 14 | Ht 65.5 in | Wt 142.2 lb

## 2012-08-22 DIAGNOSIS — Z780 Asymptomatic menopausal state: Secondary | ICD-10-CM

## 2012-08-22 DIAGNOSIS — Z0000 Encounter for general adult medical examination without abnormal findings: Secondary | ICD-10-CM

## 2012-08-22 MED ORDER — ZOSTER VACCINE LIVE 19400 UNT/0.65ML ~~LOC~~ SOLR: 0.6500 mL | Freq: Once | SUBCUTANEOUS | Status: DC

## 2012-08-22 NOTE — Patient Instructions (Addendum)
Preventive Health Care: Exercise  30-45  minutes a day, 3-4 days a week. Walking is especially valuable in preventing Osteoporosis. Eat a low-fat diet with lots of fruits and vegetables, up to 7-9 servings per day.  As per the Standard of Care , screening Colonoscopy recommended @ 50 & every 5-10 years thereafter . More frequent monitor would be dictated by family history or findings @ Colonoscopy  Please  schedule fasting Labs : BMET,Lipids, hepatic panel, CBC & dif, TSH, vitamin D level.  PLEASE BRING THESE INSTRUCTIONS TO FOLLOW UP  LAB APPOINTMENT.This will guarantee correct labs are drawn, eliminating need for repeat blood sampling ( needle sticks ! ). Diagnoses /Codes: V70.0.

## 2012-08-22 NOTE — Progress Notes (Signed)
  Subjective:    Patient ID: Debbie Hogan, female    DOB: [birth year], 61 y.o.   MRN: [id number]  HPI  Debbie Hogan  is here for a physical; she denies acute health  Issues.      Review of Systems  She continues to have costochondritis type chest wall pain 1-2 days after exercising. She has not started glucosamine supplementation as of this date. The costochondritis is certainly no worse.     Objective:   Physical Exam Gen.: Thin but healthy and well-nourished in appearance. Alert, appropriate and cooperative throughout exam. Head: Normocephalic without obvious abnormalities  Eyes: No corneal or conjunctival inflammation noted. Pupils equal round reactive to light and accommodation. Fundal exam is benign without hemorrhages, exudate, papilledema. Extraocular motion intact. Vision grossly normal. Ears: External  ear exam reveals no significant lesions or deformities. Canals clear .TMs normal. Hearing is grossly normal bilaterally. Nose: External nasal exam reveals no deformity or inflammation. Nasal mucosa are pink and moist. No lesions or exudates noted.   Mouth: Oral mucosa and oropharynx reveal no lesions or exudates. Teeth in good repair. Neck: No deformities, masses, or tenderness noted. Range of motion & Thyroid  nomal. Lungs: Normal respiratory effort; chest expands symmetrically. Lungs are clear to auscultation without rales, wheezes, or increased work of breathing. Heart: Normal rate and rhythm. Normal S1 and S2. No gallop,  or rub. No click or  murmur. Abdomen: Bowel sounds normal; abdomen soft and nontender. No masses, organomegaly or hernias noted. Genitalia: Dr Nicholas Lose, Gyn                                                          Musculoskeletal/extremities: No deformity or scoliosis noted of  the thoracic or lumbar spine. No clubbing, cyanosis, edema, or deformity noted. Range of motion  normal .Tone & strength  normal.Joints normal. Nail health  good. Vascular: Carotid, radial  artery, dorsalis pedis and  posterior tibial pulses are full and equal. No bruits present. Neurologic: Alert and oriented x3. Deep tendon reflexes symmetrical and normal.          Skin: Intact without suspicious lesions or rashes. Lymph: No cervical, axillary lymphadenopathy present. Psych: Mood and affect are normal. Normally interactive                                                                                         Assessment & Plan:  #1 comprehensive physical exam; no acute findings Plan: see recommendations

## 2012-08-24 ENCOUNTER — Other Ambulatory Visit

## 2012-08-29 ENCOUNTER — Ambulatory Visit
Admission: RE | Admit: 2012-08-29 | Discharge: 2012-08-29 | Disposition: A | Discharge status: Home / Self Care | Source: Ambulatory Visit | Attending: Internal Medicine | Admitting: Internal Medicine

## 2012-08-29 ENCOUNTER — Other Ambulatory Visit (INDEPENDENT_AMBULATORY_CARE_PROVIDER_SITE_OTHER)

## 2012-08-29 DIAGNOSIS — Z780 Asymptomatic menopausal state: Secondary | ICD-10-CM

## 2012-08-29 DIAGNOSIS — Z0000 Encounter for general adult medical examination without abnormal findings: Secondary | ICD-10-CM

## 2012-08-29 LAB — HEPATIC FUNCTION PANEL
ALT: 22 U/L (ref 0–35)
AST: 25 U/L (ref 0–37)
Albumin: 3.6 g/dL (ref 3.5–5.2)
Total Bilirubin: 0.5 mg/dL (ref 0.3–1.2)

## 2012-08-29 LAB — BASIC METABOLIC PANEL
CO2: 28 mEq/L (ref 19–32)
Calcium: 8.7 mg/dL (ref 8.4–10.5)
Glucose, Bld: 93 mg/dL (ref 70–99)
Potassium: 3.9 mEq/L (ref 3.5–5.1)
Sodium: 137 mEq/L (ref 135–145)

## 2012-08-29 LAB — TSH: TSH: 1.89 u[IU]/mL (ref 0.35–5.50)

## 2012-08-29 LAB — LIPID PANEL
HDL: 60.8 mg/dL (ref >39.00)
Total CHOL/HDL Ratio: 3
Triglycerides: 84 mg/dL (ref 0.0–149.0)
VLDL: 16.8 mg/dL (ref 0.0–40.0)

## 2012-08-29 LAB — CBC WITH DIFFERENTIAL/PLATELET
Basophils Relative: 1 % (ref 0.0–3.0)
Eosinophils Relative: 20.4 % — ABNORMAL HIGH (ref 0.0–5.0)
Monocytes Relative: 5 % (ref 3.0–12.0)
Neutrophils Relative %: 53.8 % (ref 43.0–77.0)
Platelets: 343 10*3/uL (ref 150.0–400.0)
RBC: 4.49 Mil/uL (ref 3.87–5.11)
WBC: 9 10*3/uL (ref 4.5–10.5)

## 2012-09-07 LAB — VITAMIN D 1,25 DIHYDROXY

## 2012-09-18 ENCOUNTER — Other Ambulatory Visit: Payer: Self-pay | Admitting: Internal Medicine

## 2012-09-18 ENCOUNTER — Encounter: Payer: Self-pay | Admitting: Internal Medicine

## 2012-09-19 ENCOUNTER — Encounter: Payer: Self-pay | Admitting: Internal Medicine

## 2012-09-19 ENCOUNTER — Other Ambulatory Visit: Payer: Self-pay

## 2012-09-19 MED ORDER — SIMVASTATIN 40 MG PO TABS: ORAL_TABLET | ORAL | Status: DC

## 2012-09-19 NOTE — Telephone Encounter (Signed)
Rx sent.    MW 

## 2012-09-22 LAB — VITAMIN D 1,25 DIHYDROXY
Vitamin D 1, 25 (OH)2 Total: 67 pg/mL (ref 18–72)
Vitamin D2 1, 25 (OH)2: <8 pg/mL

## 2012-10-10 ENCOUNTER — Telehealth: Payer: Self-pay

## 2012-10-10 NOTE — Telephone Encounter (Signed)
I spoke with patient, patient verbalized understanding of results

## 2012-10-10 NOTE — Telephone Encounter (Signed)
Message copied by Maurice Small on Mon Oct 10, 2012  5:31 PM ------      Message from: Pecola Lawless      Created: Sat Oct 08, 2012  8:47 AM       Please let her know her bone density is normal; it can be repeated in three - five years

## 2012-10-19 HISTORY — PX: DILATION AND CURETTAGE OF UTERUS: SHX78

## 2012-10-26 ENCOUNTER — Encounter: Payer: Self-pay | Admitting: Internal Medicine

## 2012-11-25 ENCOUNTER — Ambulatory Visit (INDEPENDENT_AMBULATORY_CARE_PROVIDER_SITE_OTHER): Admitting: Internal Medicine

## 2012-11-25 DIAGNOSIS — Z789 Other specified health status: Secondary | ICD-10-CM

## 2012-11-25 DIAGNOSIS — Z23 Encounter for immunization: Secondary | ICD-10-CM

## 2012-11-25 MED ORDER — MENINGOCOCCAL VAC A,C,Y,W-135 ~~LOC~~ INJ
0.5000 mL | INJECTION | Freq: Once | SUBCUTANEOUS | Status: AC
  Administered 2012-11-25: 0.5 mL via SUBCUTANEOUS

## 2012-11-25 MED ORDER — ATOVAQUONE-PROGUANIL HCL 250-100 MG PO TABS: 1.0000 | ORAL_TABLET | Freq: Every day | ORAL | Status: DC

## 2012-11-25 MED ORDER — TYPHOID VACCINE PO CPDR: 1.0000 | DELAYED_RELEASE_CAPSULE | ORAL | Status: DC

## 2012-11-25 MED ORDER — FLUCONAZOLE 150 MG PO TABS: 150.0000 mg | ORAL_TABLET | Freq: Once | ORAL | Status: DC

## 2012-11-25 MED ORDER — CIPROFLOXACIN HCL 500 MG PO TABS: 500.0000 mg | ORAL_TABLET | Freq: Two times a day (BID) | ORAL | Status: DC

## 2012-11-25 MED ORDER — ENOXAPARIN SODIUM 60 MG/0.6ML ~~LOC~~ SOLN: 60.0000 mg | Freq: Two times a day (BID) | SUBCUTANEOUS | Status: DC

## 2012-11-29 DIAGNOSIS — Z789 Other specified health status: Secondary | ICD-10-CM | POA: Insufficient documentation

## 2012-11-29 NOTE — Progress Notes (Signed)
This is a free travel visit. She will be traveling to [place] next month for about 7-10 days. Has not been before.  Appropriate pretravel device given and vaccines and prophylaxis recommended. Prescriptions given.  Advice on registering the [facility] and medical insurance given. Information provided and handout. She was given Tdap and meningococcal vaccine. She was also provided a prescription for typhoid vaccine and traveler's diarrhea treatment with Cipro. Cautions in country including food and safety information given.

## 2012-12-05 ENCOUNTER — Other Ambulatory Visit: Payer: Self-pay | Admitting: Internal Medicine

## 2012-12-13 ENCOUNTER — Other Ambulatory Visit: Payer: Self-pay | Admitting: Internal Medicine

## 2012-12-13 ENCOUNTER — Telehealth: Payer: Self-pay | Admitting: Internal Medicine

## 2012-12-13 NOTE — Telephone Encounter (Signed)
Per note on hold for Dr.Young, patient is requesting medication because she is traveling. Per Dr.Hopper call patient and clarify- why she is requesting from 2 different MD's and where is she traveling to (if patient going to a sunny location, doxy should not be taken)   Left message on VM for patient to return call when available

## 2012-12-13 NOTE — Telephone Encounter (Deleted)
Dr.Hopper please advise on request for ABX

## 2012-12-13 NOTE — Telephone Encounter (Signed)
If not addressed by Dr Maple Hudson: Zicam Melts or Zinc lozenges for scratchy throay ; vitamin C 2000 mg daily; & Echinacea for 4-7 days. Tessalon OK for cough if not allergic.Report persistent or progressive  fever, exudate("pus") or frontal headache or facial  pain.

## 2012-12-14 NOTE — Telephone Encounter (Signed)
Called patient x 2, call would not go through. I will send rx back to the pharmacy as a denial with an indication that patient needs to call the office

## 2012-12-14 NOTE — Telephone Encounter (Signed)
Message was somehow taken out of triage without being completed yesterday. I LMTCBx1 with the pt to see if med is still needed. Carron Curie, CMA

## 2012-12-15 NOTE — Telephone Encounter (Signed)
lmomtcb x2 for pt on mobile/home #

## 2012-12-16 NOTE — Telephone Encounter (Signed)
LM on cell and advised if medication is still needed then to call our office. Original message stated the pt was going out of town so that is why we are unable to reach the pt. Carron Curie, CMA

## 2013-01-31 ENCOUNTER — Encounter (HOSPITAL_COMMUNITY): Payer: Self-pay | Admitting: Emergency Medicine

## 2013-01-31 ENCOUNTER — Emergency Department (HOSPITAL_COMMUNITY)
Admission: EM | Admit: 2013-01-31 | Discharge: 2013-01-31 | Disposition: A | Discharge status: Home / Self Care | Attending: Emergency Medicine | Admitting: Emergency Medicine

## 2013-01-31 ENCOUNTER — Telehealth: Payer: Self-pay | Admitting: Internal Medicine

## 2013-01-31 DIAGNOSIS — N200 Calculus of kidney: Secondary | ICD-10-CM | POA: Insufficient documentation

## 2013-01-31 DIAGNOSIS — Z8679 Personal history of other diseases of the circulatory system: Secondary | ICD-10-CM | POA: Insufficient documentation

## 2013-01-31 DIAGNOSIS — I10 Essential (primary) hypertension: Secondary | ICD-10-CM | POA: Insufficient documentation

## 2013-01-31 DIAGNOSIS — Z860100 Personal history of colon polyps, unspecified: Secondary | ICD-10-CM | POA: Insufficient documentation

## 2013-01-31 DIAGNOSIS — R109 Unspecified abdominal pain: Secondary | ICD-10-CM

## 2013-01-31 DIAGNOSIS — G43909 Migraine, unspecified, not intractable, without status migrainosus: Secondary | ICD-10-CM | POA: Insufficient documentation

## 2013-01-31 DIAGNOSIS — Z7982 Long term (current) use of aspirin: Secondary | ICD-10-CM | POA: Insufficient documentation

## 2013-01-31 DIAGNOSIS — Z87891 Personal history of nicotine dependence: Secondary | ICD-10-CM | POA: Insufficient documentation

## 2013-01-31 DIAGNOSIS — Z79899 Other long term (current) drug therapy: Secondary | ICD-10-CM | POA: Insufficient documentation

## 2013-01-31 DIAGNOSIS — E785 Hyperlipidemia, unspecified: Secondary | ICD-10-CM | POA: Insufficient documentation

## 2013-01-31 DIAGNOSIS — R319 Hematuria, unspecified: Secondary | ICD-10-CM

## 2013-01-31 DIAGNOSIS — Z86718 Personal history of other venous thrombosis and embolism: Secondary | ICD-10-CM | POA: Insufficient documentation

## 2013-01-31 LAB — URINE MICROSCOPIC-ADD ON

## 2013-01-31 LAB — URINALYSIS, ROUTINE W REFLEX MICROSCOPIC
Bilirubin Urine: NEGATIVE
Glucose, UA: NEGATIVE mg/dL
Ketones, ur: >80 mg/dL — AB
Leukocytes, UA: NEGATIVE
Nitrite: NEGATIVE
Protein, ur: 30 mg/dL — AB
Specific Gravity, Urine: 1.027 (ref 1.005–1.030)
Urobilinogen, UA: 0.2 mg/dL (ref 0.0–1.0)
pH: 5.5 (ref 5.0–8.0)

## 2013-01-31 MED ORDER — SODIUM CHLORIDE 0.9 % IV BOLUS (SEPSIS)
1000.0000 mL | Freq: Once | INTRAVENOUS | Status: AC
  Administered 2013-01-31: 1000 mL via INTRAVENOUS

## 2013-01-31 MED ORDER — ONDANSETRON HCL 4 MG/2ML IJ SOLN
4.0000 mg | Freq: Once | INTRAMUSCULAR | Status: AC
Start: 2013-01-31 — End: 2013-01-31
  Administered 2013-01-31: 4 mg via INTRAVENOUS
  Filled 2013-01-31: qty 2

## 2013-01-31 MED ORDER — HYDROCODONE-ACETAMINOPHEN 5-325 MG PO TABS: 1.0000 | ORAL_TABLET | ORAL | Status: DC | PRN

## 2013-01-31 MED ORDER — MORPHINE SULFATE 2 MG/ML IJ SOLN
2.0000 mg | Freq: Once | INTRAMUSCULAR | Status: AC
  Administered 2013-01-31: 2 mg via INTRAVENOUS
  Filled 2013-01-31: qty 1

## 2013-01-31 MED ORDER — ONDANSETRON HCL 4 MG PO TABS: 4.0000 mg | ORAL_TABLET | Freq: Three times a day (TID) | ORAL | Status: DC | PRN

## 2013-01-31 NOTE — ED Provider Notes (Signed)
Patient seen/examined in the Emergency Department in conjunction with Resident Physician Provider Beverely Pace Patient reports abdominal pain Exam : LUQ/left CVA tenderness but otherwise stable Plan: treat pain, check urine and reassess   Joya Gaskins, MD 01/31/13 1216

## 2013-01-31 NOTE — ED Provider Notes (Signed)
History     CSN: [id number]  Arrival date & time 01/31/13  7829   First MD Initiated Contact with Patient 01/31/13 1004      Chief Complaint  Patient presents with  . Abdominal Pain    (Consider location/radiation/quality/duration/timing/severity/associated sxs/prior treatment) HPI Comments: 15 y F with PMH of HTN, migrane, allergic bronchopulmonary aspergillosis, colonic polyps s/p polypectomy and DVT (2007 - post trauma and travel) here for abd pain.  Abd pain began yesterday in the morning, lasted 4 hours, left sided, sometimes radiates to the back.  She had one episode of NB/NB emesis yesterday.  Normal BM Sunday.  No flatus today.  Abd pain returned this morning, improved after taking some Lortab she had at home.  No fevers, chills, CP, GERD, epigastric pain, urinary sx.   Patient is a 62 y.o. female presenting with abdominal pain. The history is provided by the patient.  Abdominal Pain Pain location: left sided. Pain quality: aching   Pain radiation: once or twice to the back. Pain severity:  Mild Onset quality:  Gradual Duration: yesterday in the morning and again today. Timing:  Constant Progression:  Partially resolved (improved after taking Lortab) Chronicity:  New Context: not alcohol use, not diet changes, not eating, not recent illness and not sick contacts   Ineffective treatments: lortab. Associated symptoms: no anorexia, no belching, no chest pain, no cough, no diarrhea, no dysuria, no fever, no flatus (none today), no melena, no nausea, no vaginal bleeding, no vaginal discharge and no vomiting     Past Medical History  Diagnosis Date  . Hypertension   . Colonic polyp 2009  . MVP (mitral valve prolapse)     Dr.Brackbill/Cardiology  . Migraine   . 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 Hyperlipemia     see NMR data  . DVT (deep venous thrombosis)     LLE 2007 (post trauma and prolonged air travel), Lovenox x 6 months    Past  Surgical History  Procedure Laterality Date  . Oophorectomy      3  benign cysts  . Nasal sinus surgery  2009    polyps;Dr.Rosen  . Tonsillectomy    . Colonoscopy w/ polypectomy  2009    ? [facility]    Family History  Problem Relation Age of Onset  . Dementia Father   . Asthma Father   . Depression Father   . Alcohol abuse Father   . Hypotension Father   . Hypertension Mother     MGM   . Depression Mother   . Hypertension Sister   . Alzheimer's disease Paternal Uncle   . Ovarian cancer Maternal Aunt   . Diabetes Neg Hx   . Stroke Neg Hx   . Depression Sister     X 2  . Alcohol abuse Sister     X 2    History  Substance Use Topics  . Smoking status: Former Smoker    Quit date: 10/19/1980  . Smokeless tobacco: Not on file     Comment: Positive history of passive tobacco smoke exposure until 1990  . Alcohol Use: Yes     Comment: Social alcohol  4 / week    OB History   Grav Para Term Preterm Abortions TAB SAB Ect Mult Living                  Review of Systems  Constitutional: Negative for fever.  Respiratory: Negative for cough.   Cardiovascular:  Negative for chest pain.  Gastrointestinal: Positive for abdominal pain. Negative for nausea, vomiting, diarrhea, melena, anorexia and flatus (none today).  Genitourinary: Negative for dysuria, frequency, decreased urine volume, vaginal bleeding, vaginal discharge and difficulty urinating.  All other systems reviewed and are negative.    Allergies  Montelukast sodium  Home Medications   Current Outpatient Rx  Name  Route  Sig  Dispense  Refill  . albuterol (PROVENTIL HFA) 108 (90 BASE) MCG/ACT inhaler   Inhalation   Inhale 2 puffs into the lungs every 6 (six) hours as needed for wheezing or shortness of breath.   1 Inhaler   prn   . aspirin 325 MG EC tablet   Oral   Take 325 mg by mouth daily.           . calcium citrate-vitamin D (CITRACAL+D) 315-200 MG-UNIT per tablet   Oral   Take 1 tablet by  mouth daily.          . Cholecalciferol (VITAMIN D3) 1000 UNIT capsule   Oral   Take 1,000 Units by mouth daily.           Marland Kitchen estradiol (VIVELLE-DOT) 0.0375 MG/24HR   Transdermal   Place 1 patch onto the skin 2 (two) times a week. Place on Monday and Thursday         . fish oil-omega-3 fatty acids 1000 MG capsule   Oral   Take 1 g by mouth daily.           . Fluticasone-Salmeterol (ADVAIR DISKUS) 100-50 MCG/DOSE AEPB      1 puff then rise twice daily   180 each   3   . LORazepam (ATIVAN) 0.5 MG tablet   Oral   Take 0.5 mg by mouth as needed. anxiety         . losartan (COZAAR) 100 MG tablet      TAKE 1/2 TABLET BY MOUTH DAILY   45 tablet   2   . progesterone (PROMETRIUM) 200 MG capsule   Oral   Take 200 mg by mouth daily. Take for 12 days every 3rd month         . simvastatin (ZOCOR) 40 MG tablet      TAKE 1 TABLET BY MOUTH AT BEDTIME   30 tablet   0   . WELLBUTRIN XL 300 MG 24 hr tablet      TAKE 1 TABLET BY MOUTH DAILY   90 tablet   2   . EXPIRED: fluticasone (FLONASE) 50 MCG/ACT nasal spray   Nasal   Place 1 spray into the nose daily.   48 g   4   . HYDROcodone-acetaminophen (NORCO) 5-325 MG per tablet   Oral   Take 1-2 tablets by mouth every 4 (four) hours as needed for pain.   30 tablet   0   . ondansetron (ZOFRAN) 4 MG tablet   Oral   Take 1 tablet (4 mg total) by mouth every 8 (eight) hours as needed for nausea.   12 tablet   0     BP 163/77  Pulse 70  Temp(Src) 98.4 F (36.9 C) (Oral)  Resp 18  SpO2 96%  Physical Exam  Vitals reviewed. Constitutional: She is oriented to person, place, and time. She appears well-developed and well-nourished.  HENT:  Right Ear: External ear normal.  Left Ear: External ear normal.  Mouth/Throat: No oropharyngeal exudate.  Eyes: Conjunctivae and EOM are normal. Pupils are equal, round, and reactive to light.  Neck: Normal range of motion. Neck supple.  Cardiovascular: Normal rate,  regular rhythm, normal heart sounds and intact distal pulses.  Exam reveals no gallop and no friction rub.   No murmur heard. Pulmonary/Chest: Effort normal and breath sounds normal.  Abdominal: Soft. Bowel sounds are normal. She exhibits no distension. There is no hepatosplenomegaly. There is tenderness (mild, left middle 1/3). There is no rebound, no guarding and no CVA tenderness.  Musculoskeletal: Normal range of motion. She exhibits no edema.  Neurological: She is alert and oriented to person, place, and time. No cranial nerve deficit.  Skin: Skin is warm and dry. No rash noted.  Psychiatric: She has a normal mood and affect.    ED Course  Procedures (including critical care time)  Labs Reviewed  URINALYSIS, ROUTINE W REFLEX MICROSCOPIC - Abnormal; Notable for the following:    APPearance CLOUDY (*)    Hgb urine dipstick LARGE (*)    Ketones, ur >80 (*)    Protein, ur 30 (*)    All other components within normal limits  URINE MICROSCOPIC-ADD ON - Abnormal; Notable for the following:    Crystals CA OXALATE CRYSTALS (*)    All other components within normal limits   No results found.   1. Nephrolithiasis   2. Flank pain, acute   3. Hematuria       MDM    38 y F with PMH of HTN, migrane, allergic bronchopulmonary aspergillosis, colonic polyps s/p polypectomy and DVT (2007 - post trauma and travel) here for abd pain.  Abd pain began yesterday in the morning, lasted 4 hours, left sided, sometimes radiates to the back.  She had one episode of NB/NB emesis yesterday.  Normal BM Sunday.  No flatus today.  Abd pain returned this morning, improved after taking some Lortab she had at home.  No fevers, chills, CP, GERD, epigastric pain, urinary sx.  AFVSS, well appearing.  CP exams WNLs.  No CVAT.  Mild TTP with deep palpation of left mid 1/3 of abdomen.  No rebound/guarding.  Diff Dx: pSBO, constipation, nephrlithiasis, gas pain, diverticulitis.  Doubt acute etiology of this pt's  complaint given her reassuring exam.  Will check UA.  1:05 PM UA with hematuria, supporting a diagnosis of nephrolithiasis which fits with her symptoms.  She is pain free currently and is agreeable with d/c home and plan to return if pain worsens.  Do not feel that imaging is necessary at this time.  Rx for Norco.  Return precautions reviewed.  It is felt the pt is stable for d/c with close PCP f/u.  All questions answered and patient expressed understanding.  Disposition: Discharge  Condition: Good  Follow-up Information   Schedule an appointment as soon as possible for a visit with Marga Melnick, MD.   Contact information:   [phone] [facility] [address] [place] [address] [phone]         Medication List    TAKE these medications       HYDROcodone-acetaminophen 5-325 MG per tablet  Commonly known as:  NORCO  Take 1-2 tablets by mouth every 4 (four) hours as needed for pain.     ondansetron 4 MG tablet  Commonly known as:  ZOFRAN  Take 1 tablet (4 mg total) by mouth every 8 (eight) hours as needed for nausea.       Pt seen in conjunction with my attending, Dr. Bebe Shaggy.   Oleh Genin, MD PGY-II [facility] Emergency Medicine Resident  Oleh Genin, MD 01/31/13 [phone]

## 2013-01-31 NOTE — Telephone Encounter (Signed)
Noted, per Dr.Hopper is patient with pending appointment or sent to ER or UC ok to close encounter

## 2013-01-31 NOTE — Telephone Encounter (Signed)
Patient Information:  Caller Name: Boneta Lucks  Phone: [phone]  Patient: Navdeep, Fessenden  Gender: Female  DOB: November 23, 1950  Age: 62 Years  PCP: Marga Melnick  Office Follow Up:  Does the office need to follow up with this patient?: No  Instructions For The Office: N/A   Symptoms  Reason For Call & Symptoms: Left lower abdominal pain; rated at 9 of 10, constant.  Other emergent symptoms ruled out.  ED per Abdominal Pain protocol. Caller agreed to go to [facility] ED.    Reviewed Health History In EMR: Yes  Reviewed Medications In EMR: Yes  Reviewed Allergies In EMR: Yes  Reviewed Surgeries / Procedures: Yes  Date of Onset of Symptoms: 01/30/2013  Guideline(s) Used:  Abdominal Pain - Female  Disposition Per Guideline:   Go to ED Now  Reason For Disposition Reached:   Severe abdominal pain (e.g., excruciating)  Advice Given:  Call Back If:  You become worse.  Patient Will Follow Care Advice:  YES

## 2013-01-31 NOTE — ED Notes (Signed)
Pt c/o left lower quadrant abdominal pain onset yesterday but resolved on its own. Pt began with same pain today. Pt took 1/2 a hydrocodone with tylenol at 0515 this morning with some relief. Pt had N/V yesterday but not today. Pt reports pain radiates to left low back.

## 2013-02-02 NOTE — ED Provider Notes (Signed)
I have personally seen and examined the patient.  I have discussed the plan of care with the resident.  I have reviewed the documentation on PMH/FH/Soc. History.  I have reviewed the documentation of the resident and agree.  Pt well appearing, no distress, suspicion for AAA or other acute abdominal/vascular emergency is low Stable for d/c  Joya Gaskins, MD 02/02/13 0830

## 2013-02-05 ENCOUNTER — Encounter (HOSPITAL_COMMUNITY): Payer: Self-pay | Admitting: Emergency Medicine

## 2013-02-05 ENCOUNTER — Emergency Department (HOSPITAL_COMMUNITY)

## 2013-02-05 ENCOUNTER — Emergency Department (HOSPITAL_COMMUNITY)
Admission: EM | Admit: 2013-02-05 | Discharge: 2013-02-05 | Disposition: A | Discharge status: Home / Self Care | Attending: Emergency Medicine | Admitting: Emergency Medicine

## 2013-02-05 DIAGNOSIS — G43909 Migraine, unspecified, not intractable, without status migrainosus: Secondary | ICD-10-CM | POA: Insufficient documentation

## 2013-02-05 DIAGNOSIS — R110 Nausea: Secondary | ICD-10-CM | POA: Insufficient documentation

## 2013-02-05 DIAGNOSIS — Z87891 Personal history of nicotine dependence: Secondary | ICD-10-CM | POA: Insufficient documentation

## 2013-02-05 DIAGNOSIS — Z86718 Personal history of other venous thrombosis and embolism: Secondary | ICD-10-CM | POA: Insufficient documentation

## 2013-02-05 DIAGNOSIS — Z860100 Personal history of colon polyps, unspecified: Secondary | ICD-10-CM | POA: Insufficient documentation

## 2013-02-05 DIAGNOSIS — Z8679 Personal history of other diseases of the circulatory system: Secondary | ICD-10-CM | POA: Insufficient documentation

## 2013-02-05 DIAGNOSIS — K862 Cyst of pancreas: Secondary | ICD-10-CM | POA: Insufficient documentation

## 2013-02-05 DIAGNOSIS — N200 Calculus of kidney: Secondary | ICD-10-CM | POA: Insufficient documentation

## 2013-02-05 DIAGNOSIS — R918 Other nonspecific abnormal finding of lung field: Secondary | ICD-10-CM | POA: Insufficient documentation

## 2013-02-05 DIAGNOSIS — I10 Essential (primary) hypertension: Secondary | ICD-10-CM | POA: Insufficient documentation

## 2013-02-05 DIAGNOSIS — IMO0002 Reserved for concepts with insufficient information to code with codable children: Secondary | ICD-10-CM | POA: Insufficient documentation

## 2013-02-05 DIAGNOSIS — E785 Hyperlipidemia, unspecified: Secondary | ICD-10-CM | POA: Insufficient documentation

## 2013-02-05 DIAGNOSIS — Z7982 Long term (current) use of aspirin: Secondary | ICD-10-CM | POA: Insufficient documentation

## 2013-02-05 DIAGNOSIS — Z862 Personal history of diseases of the blood and blood-forming organs and certain disorders involving the immune mechanism: Secondary | ICD-10-CM | POA: Insufficient documentation

## 2013-02-05 DIAGNOSIS — Z79899 Other long term (current) drug therapy: Secondary | ICD-10-CM | POA: Insufficient documentation

## 2013-02-05 DIAGNOSIS — Z87442 Personal history of urinary calculi: Secondary | ICD-10-CM | POA: Insufficient documentation

## 2013-02-05 HISTORY — DX: Calculus of kidney: N20.0

## 2013-02-05 LAB — URINALYSIS, ROUTINE W REFLEX MICROSCOPIC
Glucose, UA: NEGATIVE mg/dL
Nitrite: NEGATIVE
Protein, ur: NEGATIVE mg/dL
Urobilinogen, UA: 0.2 mg/dL (ref 0.0–1.0)
pH: 6.5 (ref 5.0–8.0)

## 2013-02-05 LAB — CBC WITH DIFFERENTIAL/PLATELET
Basophils Absolute: 0.2 10*3/uL — ABNORMAL HIGH (ref 0.0–0.1)
Eosinophils Absolute: 0.4 10*3/uL (ref 0.0–0.7)
Eosinophils Relative: 3 % (ref 0–5)
Lymphocytes Relative: 8 % — ABNORMAL LOW (ref 12–46)
Lymphs Abs: 1 10*3/uL (ref 0.7–4.0)
MCV: 88.2 fL (ref 78.0–100.0)
Neutrophils Relative %: 82 % — ABNORMAL HIGH (ref 43–77)
Platelets: 286 10*3/uL (ref 150–400)
RBC: 4.31 MIL/uL (ref 3.87–5.11)
RDW: 12.7 % (ref 11.5–15.5)
WBC: 13.4 10*3/uL — ABNORMAL HIGH (ref 4.0–10.5)

## 2013-02-05 LAB — BASIC METABOLIC PANEL
CO2: 28 mEq/L (ref 19–32)
Calcium: 9.1 mg/dL (ref 8.4–10.5)
GFR calc non Af Amer: 57 mL/min — ABNORMAL LOW (ref >90)
Glucose, Bld: 125 mg/dL — ABNORMAL HIGH (ref 70–99)
Potassium: 4.2 mEq/L (ref 3.5–5.1)
Sodium: 131 mEq/L — ABNORMAL LOW (ref 135–145)

## 2013-02-05 LAB — LIPASE, BLOOD: Lipase: 293 U/L — ABNORMAL HIGH (ref 11–59)

## 2013-02-05 MED ORDER — HYDROMORPHONE HCL PF 1 MG/ML IJ SOLN: 0.5000 mg | Freq: Once | INTRAMUSCULAR | Status: DC

## 2013-02-05 MED ORDER — KETOROLAC TROMETHAMINE 30 MG/ML IJ SOLN
30.0000 mg | Freq: Once | INTRAMUSCULAR | Status: AC
  Administered 2013-02-05: 30 mg via INTRAMUSCULAR
  Filled 2013-02-05: qty 1

## 2013-02-05 MED ORDER — TAMSULOSIN HCL 0.4 MG PO CAPS: 0.4000 mg | ORAL_CAPSULE | Freq: Every day | ORAL | Status: DC

## 2013-02-05 MED ORDER — HYDROCODONE-ACETAMINOPHEN 10-325 MG PO TABS: 1.0000 | ORAL_TABLET | Freq: Four times a day (QID) | ORAL | Status: DC | PRN

## 2013-02-05 MED ORDER — KETOROLAC TROMETHAMINE 30 MG/ML IJ SOLN: 30.0000 mg | Freq: Once | INTRAMUSCULAR | Status: DC

## 2013-02-05 MED ORDER — NAPROXEN 500 MG PO TABS: 500.0000 mg | ORAL_TABLET | Freq: Two times a day (BID) | ORAL | Status: DC

## 2013-02-05 NOTE — ED Provider Notes (Signed)
History     CSN: [id number]  Arrival date & time 02/05/13  1702   First MD Initiated Contact with Patient 02/05/13 1709      Chief Complaint  Patient presents with  . Flank Pain    (Consider location/radiation/quality/duration/timing/severity/associated sxs/prior treatment) HPI Comments: Patient is a 62 year old female who presents for left abdominal and flank pain with onset at 7:30 this morning. Patient states the pain is waxing and waning in severity, radiating between her abdomen and left flank without any aggravating factors. She states she took Vicodin with moderate relief of her pain - she was prescribed Vicodin 5 days ago, at which time she was seen and evaluated in the ED for a similar complaint. Patient admits to associated nausea without emesis. She denies fever, chest pain, shortness of breath, diarrhea, melena, hematochezia, dysuria or hematuria, vaginal discharge, and numbness or tingling in her extremities. Patient admits to having a bowel movement this morning which was normal in color and consistency. She also endorses a history of left oophorectomy more than 20 years ago.  Patient is a 62 y.o. female presenting with flank pain. The history is provided by the patient. No language interpreter was used.  Flank Pain Associated symptoms include nausea. Pertinent negatives include no abdominal pain, chest pain, chills, fever, numbness, vomiting or weakness.    Past Medical History  Diagnosis Date  . Hypertension   . Colonic polyp 2009  . MVP (mitral valve prolapse)     Dr.Brackbill/Cardiology  . Migraine   . 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 Hyperlipemia     see NMR data  . DVT (deep venous thrombosis)     LLE 2007 (post trauma and prolonged air travel), Lovenox x 6 months  . Kidney stone     Past Surgical History  Procedure Laterality Date  . Oophorectomy      3  benign cysts  . Nasal sinus surgery  2009    polyps;Dr.Rosen  .  Tonsillectomy    . Colonoscopy w/ polypectomy  2009    ? [facility]    Family History  Problem Relation Age of Onset  . Dementia Father   . Asthma Father   . Depression Father   . Alcohol abuse Father   . Hypotension Father   . Hypertension Mother     MGM   . Depression Mother   . Hypertension Sister   . Alzheimer's disease Paternal Uncle   . Ovarian cancer Maternal Aunt   . Diabetes Neg Hx   . Stroke Neg Hx   . Depression Sister     X 2  . Alcohol abuse Sister     X 2    History  Substance Use Topics  . Smoking status: Former Smoker    Quit date: 10/19/1980  . Smokeless tobacco: Not on file     Comment: Positive history of passive tobacco smoke exposure until 1990  . Alcohol Use: Yes     Comment: Social alcohol  4 / week    OB History   Grav Para Term Preterm Abortions TAB SAB Ect Mult Living                  Review of Systems  Constitutional: Negative for fever and chills.  Respiratory: Negative for shortness of breath.   Cardiovascular: Negative for chest pain.  Gastrointestinal: Positive for nausea. Negative for vomiting, abdominal pain and diarrhea.  Genitourinary: Positive for flank pain. Negative for dysuria, frequency  and hematuria.  Neurological: Negative for syncope, weakness and numbness.  All other systems reviewed and are negative.    Allergies  Montelukast sodium  Home Medications   Current Outpatient Rx  Name  Route  Sig  Dispense  Refill  . albuterol (PROVENTIL HFA) 108 (90 BASE) MCG/ACT inhaler   Inhalation   Inhale 2 puffs into the lungs every 6 (six) hours as needed for wheezing or shortness of breath.   1 Inhaler   prn   . aspirin 325 MG EC tablet   Oral   Take 325 mg by mouth daily.           Marland Kitchen buPROPion (WELLBUTRIN XL) 300 MG 24 hr tablet   Oral   Take 300 mg by mouth daily.         . calcium citrate-vitamin D (CITRACAL+D) 315-200 MG-UNIT per tablet   Oral   Take 1 tablet by mouth daily.          . Cholecalciferol  (VITAMIN D3) 1000 UNIT capsule   Oral   Take 1,000 Units by mouth daily.           Marland Kitchen estradiol (VIVELLE-DOT) 0.0375 MG/24HR   Transdermal   Place 1 patch onto the skin 2 (two) times a week. Place on Monday and Thursday         . fish oil-omega-3 fatty acids 1000 MG capsule   Oral   Take 1 g by mouth daily.           . fluticasone (FLONASE) 50 MCG/ACT nasal spray   Nasal   Place 2 sprays into the nose daily.         . Fluticasone-Salmeterol (ADVAIR) 100-50 MCG/DOSE AEPB   Oral   Take 1 puff by mouth daily.         Marland Kitchen HYDROcodone-acetaminophen (NORCO/VICODIN) 5-325 MG per tablet   Oral   Take 1-2 tablets by mouth every 4 (four) hours as needed for pain.         Marland Kitchen LORazepam (ATIVAN) 0.5 MG tablet   Oral   Take 0.5 mg by mouth daily as needed for anxiety. anxiety         . losartan (COZAAR) 100 MG tablet   Oral   Take 50 mg by mouth daily.         . ondansetron (ZOFRAN) 4 MG tablet   Oral   Take 4 mg by mouth every 8 (eight) hours as needed for nausea.         . progesterone (PROMETRIUM) 200 MG capsule   Oral   Take 200 mg by mouth daily. Take for 12 days every 3rd month         . simvastatin (ZOCOR) 40 MG tablet   Oral   Take 40 mg by mouth every evening.         Marland Kitchen HYDROcodone-acetaminophen (NORCO) 10-325 MG per tablet   Oral   Take 1 tablet by mouth every 6 (six) hours as needed for pain (Take for severe pain).   15 tablet   0   . naproxen (NAPROSYN) 500 MG tablet   Oral   Take 1 tablet (500 mg total) by mouth 2 (two) times daily with a meal.   30 tablet   0   . tamsulosin (FLOMAX) 0.4 MG CAPS   Oral   Take 1 capsule (0.4 mg total) by mouth daily.   10 capsule   0     BP 176/91  Pulse 91  Temp(Src) 97.7 F (36.5 C) (Oral)  Resp 18  SpO2 96%  Physical Exam  Nursing note and vitals reviewed. Constitutional: She is oriented to person, place, and time. She appears well-developed and well-nourished. No distress.  HENT:  Head:  Normocephalic and atraumatic.  Mouth/Throat: Oropharynx is clear and moist. No oropharyngeal exudate.  Eyes: Conjunctivae and EOM are normal. Pupils are equal, round, and reactive to light. No scleral icterus.  Neck: Normal range of motion. Neck supple.  Cardiovascular: Normal rate, regular rhythm, normal heart sounds and intact distal pulses.   Pulmonary/Chest: Effort normal and breath sounds normal. No respiratory distress. She has no wheezes. She has no rales.  Abdominal: Soft. Bowel sounds are normal. She exhibits no distension and no mass. There is no tenderness. There is no rebound and no guarding.  No peritoneal signs or palpable masses appreciated  Musculoskeletal: Normal range of motion. She exhibits no edema.  Lymphadenopathy:    She has no cervical adenopathy.  Neurological: She is alert and oriented to person, place, and time.  Skin: Skin is warm and dry. No rash noted. She is not diaphoretic. No erythema.  Psychiatric: She has a normal mood and affect. Her behavior is normal.    ED Course  Procedures (including critical care time)  Labs Reviewed  CBC WITH DIFFERENTIAL - Abnormal; Notable for the following:    WBC 13.4 (*)    Neutrophils Relative 82 (*)    Neutro Abs 11.1 (*)    Lymphocytes Relative 8 (*)    Basophils Absolute 0.2 (*)    All other components within normal limits  BASIC METABOLIC PANEL - Abnormal; Notable for the following:    Sodium 131 (*)    Chloride 95 (*)    Glucose, Bld 125 (*)    GFR calc non Af Amer 57 (*)    GFR calc Af Amer 67 (*)    All other components within normal limits  LIPASE, BLOOD - Abnormal; Notable for the following:    Lipase 293 (*)    All other components within normal limits  URINALYSIS, ROUTINE W REFLEX MICROSCOPIC   Ct Abdomen Pelvis Wo Contrast  02/05/2013  *RADIOLOGY REPORT*  Clinical Data: Left flank pain.  CT ABDOMEN AND PELVIS WITHOUT CONTRAST  Technique:  Multidetector CT imaging of the abdomen and pelvis was  performed following the standard protocol without intravenous contrast.  Comparison: None.  Findings: Examination of the lung bases demonstrates bilateral lower lobe bronchiectasis with an expanded and soft tissue failed left lower lobe bronchus.  There is also airspace opacity left lung base which could be focal pneumonia.  I would recommend a dedicated full chest CT after appropriate antibiotic therapy to reassess this area.  No pleural effusion  The unenhanced appearance of the liver is unremarkable.  The gallbladder appears normal.  No common bile duct dilatation.  The pancreas is normal except for a a small low attenuation lesion in the pancreatic head which is likely a benign cyst but does need further evaluation.  The spleen is normal in size.  No focal lesions.  The adrenal glands are normal.  The right kidney is normal.  Left kidney demonstrates marked hydronephrosis.  There is a 6 mm UPJ calculus.  No renal calculi.  The stomach is distended with fluid.  The duodenum, small bowel and colon grossly normal without oral contrast.  No mesenteric or retroperitoneal mass or adenopathy.  The uterus and ovaries are grossly normal.  No pelvic mass, adenopathy or free  pelvic fluid collections.  The bladder is normal.  No inguinal mass or hernia.  The bony structures are intact.  No acute bony findings.  IMPRESSION:  1.  6 mm left UPJ calculus with a moderate to high grade obstruction of the left kidney. 2.  Lower lobe bronchiectasis with suspected left lower lobe infiltrate and mucous filled left lower lobe bronchus.  Recommend follow-up chest CT after appropriate antibiotic therapy to reassess. 3.  Small low attenuation lesion in the pancreatic head is likely a benign cyst but does need further evaluation.  A pancreatic CT scan is recommended with contrast for follow up (non urgent).   Original Report Authenticated By: Rudie Meyer, M.D.      1. Nephrolithiasis   2. Pancreatic cyst   3. Lung infiltrate       MDM  Patient presents for left flank and abdominal pain with onset this morning. Patient was seen and evaluated in the emergency department 5 days ago for the same type of pain which spontaneously resolved after her ED discharge; patient was diagnosed with nephrolithiasis supported by red blood cells the patient's urine. Patient's physical exam today is benign for any abdominal tenderness or CVA tenderness; heart RRR and lungs CTAB. Workup today to include CBC, BMP, lipase, UA, and CT abdomen and pelvis without contrast. IM Toradol ordered for discomfort.  Labs significant for leukocytosis of 13.4 and elevated lipase to 293. UA without RBCs or infection. CT scan significant for multiple findings including 1) 6mm L UPJ calculus with hydronephrosis 2/2 obstruction 2) suspected LLL infiltrate and 3) cyst at the pancreatic head. Patient with hx of allergic bronchopulmonary aspergillosis without SOB or hypoxia. It is likely that this infiltrate is related to this past disease process, but requires further follow up with pulmonology. Given elevated lipase and pancreatic cyst, patient also requires follow up with a GI specialist and CT pancreas recommended; patient does not clinically have symptoms consistent with pancreatitis at this time, however. Finally, patient has been given urology follow up for further evaluation of L UPJ calculus as duration of symptoms and size of stone bring into question the stone's ability to pass without intervention.   Patient without pain and VSS. To d/c with the follow up discussed as well as Norco 10-325 for pain, naprosyn, and flomax. Indications for ED return discussed. Patient states comfort and understanding with this d/c plan with no unaddressed concerns. Patient seen also by Dr. Hyacinth Meeker who is in agreement with this w/u and management plan.      Antony Madura, PA-C 02/06/13 1744

## 2013-02-05 NOTE — ED Notes (Signed)
Pt states she took 3 hydrocodones at 1600 today, currently pain free. Pt c/o mild trouble urinating. Denies painful urination or urinary frequency. Pt states "For what my input has been, my output has not been enough."

## 2013-02-05 NOTE — ED Notes (Signed)
Pt sts seen here on Tuesday for kidney stone and having L flank pain today

## 2013-02-05 NOTE — ED Provider Notes (Signed)
62 year old female who presents with intermittent left-sided abdominal and flank pain over the last several days. She was initially seen in the emergency Department found to have hematuria and with a story consistent with a kidney stone was sent home after her pain totally resolved. Today she returns with worsening pain which is also intermittent and very very minimal at this time. She does get nauseated and diaphoretic when the pain comes on. She has not seen any gross hematuria, has no dysuria and has no fevers or chills.  On exam the patient has a soft nontender abdomen, no CVA tenderness, normal heart and lung sounds and appears in no acute distress.  Lab work, urinalysis, CT scan of the abdomen to evaluate the size or number of kidney stones. This will also evaluate for other etiologies of the patient's pain though less likely.   Medical screening examination/treatment/procedure(s) were conducted as a shared visit with non-physician practitioner(s) and myself.  I personally evaluated the patient during the encounter    Vida Roller, MD 02/05/13 [phone]

## 2013-02-06 ENCOUNTER — Telehealth: Payer: Self-pay | Admitting: Internal Medicine

## 2013-02-06 NOTE — ED Provider Notes (Signed)
I personally informed the pt of all of her results including her pulmonary findings and her pancreatic findings neither of which appear to be acute or contributing to the pt's condition today.  She has expressed her understanding to f/u.  Medical screening examination/treatment/procedure(s) were conducted as a shared visit with non-physician practitioner(s) and myself.  I personally evaluated the patient during the encounter  Please see my separate respective documentation pertaining to this patient encounter   Vida Roller, MD 02/06/13 1946

## 2013-02-06 NOTE — Telephone Encounter (Signed)
lmomtcb for celita.

## 2013-02-07 ENCOUNTER — Other Ambulatory Visit: Payer: Self-pay | Admitting: Internal Medicine

## 2013-02-07 ENCOUNTER — Encounter: Payer: Self-pay | Admitting: Internal Medicine

## 2013-02-07 ENCOUNTER — Ambulatory Visit (INDEPENDENT_AMBULATORY_CARE_PROVIDER_SITE_OTHER): Admitting: Internal Medicine

## 2013-02-07 VITALS — BP 118/76 | HR 90 | Wt 141.4 lb

## 2013-02-07 DIAGNOSIS — R7309 Other abnormal glucose: Secondary | ICD-10-CM

## 2013-02-07 DIAGNOSIS — K862 Cyst of pancreas: Secondary | ICD-10-CM

## 2013-02-07 DIAGNOSIS — N200 Calculus of kidney: Secondary | ICD-10-CM

## 2013-02-07 DIAGNOSIS — K863 Pseudocyst of pancreas: Secondary | ICD-10-CM

## 2013-02-07 LAB — HEMOGLOBIN A1C: Hgb A1c MFr Bld: 5.5 % (ref 4.6–6.5)

## 2013-02-07 NOTE — Telephone Encounter (Signed)
Spoke with [facility]. She states pt has a partially obstructing stone, needs to be cleared for surgery as soon as possible. Per Celita, patient needs to have this surgery done next week. Dr. Maple Hudson please advise, thank you.  Last OV:03/08/12 Next OV:03/15/13

## 2013-02-07 NOTE — Progress Notes (Signed)
  Subjective:    Patient ID: Debbie Hogan, female    DOB: [birth year], 62 y.o.   MRN: [id number]  HPI The emergency room records for/15 and 02/05/13 were reviewed; summary was placed in the problem list for continuity of care. The GFR was minimally reduced but creatinine remains normal.  At this time lithotripsy is tentatively planned for next week by Dr. Margarita Grizzle; she will need preoperative clearance from Dr. Maple Hudson because of her bronchiectasis and bronchopulmonary aspergillosis.  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 Followup CT with contrast was recommended nonurgently.  Of interest but unrelated is recent foreign travel to [place] for a mission trip    Review of Systems At this time she denies abdominal pain, dysuria, pyuria, hematuria, flank pain, fever, chills, or sweats. She has had intermittent pain related to stones for which she has narcotic pain medications from a urologist.  She has resumed the branded Wellbutrin with a dramatic improvement and her attention span and focus.     Objective:   Physical Exam General appearance: thin but well nourished, w/o distress.  Eyes: No conjunctival inflammation or scleral icterus is present.     Heart:  Normal rate and regular rhythm. S1 and S2 normal without gallop, murmur, click, or rub .S4 w/o other extra sounds     Lungs:Scattered low grade rhonchi w/o  wheezes,rales ,or rubs present.No increased work of breathing.   Abdomen: bowel sounds normal, soft and non-tender without masses, organomegaly or hernias noted.  No guarding or rebound . No flank tenderness to percussion  Skin:Warm & dry.  Intact without suspicious lesions or rashes ; no jaundice or tenting  Lymphatic: No lymphadenopathy is noted about the head, neck, axilla            Assessment & Plan:

## 2013-02-07 NOTE — Patient Instructions (Addendum)
Review and correct the record as indicated. Please share record with all medical staff seen.  

## 2013-02-07 NOTE — Assessment & Plan Note (Signed)
Hemoglobin A1c

## 2013-02-07 NOTE — Assessment & Plan Note (Signed)
To be cleared preoperatively by Dr. Maple Hudson for the lithotripsy.  BUN, creatinine, and GFR  all assess kidney function. To protect the kidneys it  is important to control your blood pressure and sugar. You should also stay well hydrated. Drink to thirst, up to 32 ounces of fluids per day.

## 2013-02-07 NOTE — Assessment & Plan Note (Signed)
Followup CT with contrast to assess the incidentally noted pancreatic cyst is recommended in 3-4 months. The acute renal calculus issue must be addressed first.

## 2013-02-07 NOTE — Telephone Encounter (Signed)
Ok from pulmonary standpoint to go ahead with necessary surgery

## 2013-02-08 ENCOUNTER — Telehealth: Payer: Self-pay | Admitting: Internal Medicine

## 2013-02-08 ENCOUNTER — Encounter: Payer: Self-pay | Admitting: *Deleted

## 2013-02-08 ENCOUNTER — Other Ambulatory Visit: Payer: Self-pay | Admitting: Urology

## 2013-02-08 NOTE — Telephone Encounter (Signed)
See phone note dated 02/06/13

## 2013-02-08 NOTE — Telephone Encounter (Signed)
Spoke with Celita and the pt and informed that per CDY okay to do the surgery from a pulmonary standpoint Both verbalized understanding  I have faxed a letter to Celita at [phone] per her request Nothing further needed

## 2013-02-09 ENCOUNTER — Encounter (HOSPITAL_COMMUNITY): Payer: Self-pay

## 2013-02-09 ENCOUNTER — Other Ambulatory Visit (HOSPITAL_COMMUNITY): Payer: Self-pay | Admitting: Urology

## 2013-02-09 ENCOUNTER — Other Ambulatory Visit: Payer: Self-pay

## 2013-02-09 ENCOUNTER — Ambulatory Visit (HOSPITAL_COMMUNITY)
Admission: RE | Admit: 2013-02-09 | Discharge: 2013-02-09 | Disposition: A | Discharge status: Home / Self Care | Source: Ambulatory Visit | Attending: Urology | Admitting: Urology

## 2013-02-09 DIAGNOSIS — N201 Calculus of ureter: Secondary | ICD-10-CM | POA: Insufficient documentation

## 2013-02-09 DIAGNOSIS — Z01818 Encounter for other preprocedural examination: Secondary | ICD-10-CM | POA: Insufficient documentation

## 2013-02-09 DIAGNOSIS — J45909 Unspecified asthma, uncomplicated: Secondary | ICD-10-CM | POA: Insufficient documentation

## 2013-02-09 DIAGNOSIS — I10 Essential (primary) hypertension: Secondary | ICD-10-CM | POA: Insufficient documentation

## 2013-02-09 NOTE — Progress Notes (Signed)
Message left for return call for Lithotripsy instructions

## 2013-02-10 ENCOUNTER — Other Ambulatory Visit

## 2013-02-13 ENCOUNTER — Ambulatory Visit (HOSPITAL_COMMUNITY)

## 2013-02-13 ENCOUNTER — Ambulatory Visit (HOSPITAL_COMMUNITY)
Admission: RE | Admit: 2013-02-13 | Discharge: 2013-02-13 | Disposition: A | Discharge status: Home / Self Care | Source: Ambulatory Visit | Attending: Urology | Admitting: Urology

## 2013-02-13 ENCOUNTER — Encounter (HOSPITAL_COMMUNITY)
Admission: RE | Disposition: A | Discharge status: Home / Self Care | Payer: Self-pay | Source: Ambulatory Visit | Attending: Urology

## 2013-02-13 ENCOUNTER — Encounter (HOSPITAL_COMMUNITY): Payer: Self-pay | Admitting: *Deleted

## 2013-02-13 DIAGNOSIS — I10 Essential (primary) hypertension: Secondary | ICD-10-CM | POA: Insufficient documentation

## 2013-02-13 DIAGNOSIS — Z86718 Personal history of other venous thrombosis and embolism: Secondary | ICD-10-CM | POA: Insufficient documentation

## 2013-02-13 DIAGNOSIS — N1330 Unspecified hydronephrosis: Secondary | ICD-10-CM | POA: Insufficient documentation

## 2013-02-13 DIAGNOSIS — N200 Calculus of kidney: Secondary | ICD-10-CM | POA: Insufficient documentation

## 2013-02-13 DIAGNOSIS — B4481 Allergic bronchopulmonary aspergillosis: Secondary | ICD-10-CM | POA: Insufficient documentation

## 2013-02-13 DIAGNOSIS — E785 Hyperlipidemia, unspecified: Secondary | ICD-10-CM | POA: Insufficient documentation

## 2013-02-13 DIAGNOSIS — Z538 Procedure and treatment not carried out for other reasons: Secondary | ICD-10-CM | POA: Insufficient documentation

## 2013-02-13 SURGERY — LITHOTRIPSY, ESWL
Anesthesia: LOCAL | Laterality: Left

## 2013-02-13 MED ORDER — DIAZEPAM 5 MG PO TABS
10.0000 mg | ORAL_TABLET | ORAL | Status: AC
  Administered 2013-02-13: 10 mg via ORAL
  Filled 2013-02-13: qty 2

## 2013-02-13 MED ORDER — CIPROFLOXACIN HCL 500 MG PO TABS
500.0000 mg | ORAL_TABLET | ORAL | Status: AC
  Administered 2013-02-13: 500 mg via ORAL
  Filled 2013-02-13: qty 1

## 2013-02-13 MED ORDER — DEXTROSE-NACL 5-0.45 % IV SOLN
INTRAVENOUS | Status: DC
  Administered 2013-02-13: 12:00:00 via INTRAVENOUS

## 2013-02-13 MED ORDER — DIPHENHYDRAMINE HCL 25 MG PO CAPS
25.0000 mg | ORAL_CAPSULE | ORAL | Status: AC
  Administered 2013-02-13: 25 mg via ORAL
  Filled 2013-02-13: qty 1

## 2013-02-13 NOTE — H&P (Signed)
Urology History and Physical Exam  CC: Left nephrolithiasis  HPI: 62 year old female presents today for shockwave lithotripsy of left ureteropelvic junction stone. She began having pain on 01/30/13. It was not associated with fevers or chills. It is improved with narcotic pain medication. This is her first stone event. CT of the abdomen and pelvis 02/05/13 revealed the left-sided stone at the left ureteropelvic junction. There was associated left hydronephrosis. The stone was 6 mm in size. There were no other stones in the collecting system bilaterally. UA 02/06/13 was negative for infection. 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 Maple Hudson with pulmonology. She has received clearance from Dr. Maple Hudson to proceed with stone manipulation. She presents today for shockwave lithotripsy. We have discussed the risks, benefits, alternatives, and likelihood of achieving goals. She has stopped her fish oil and aspirin over 5 days ago.  The stone is not visible on KUB or fluoroscopy. She has not been straining her urine. Negative gross hematuria or flank pain. I have discussed risks of retained asymptomatic stones and recommended follow up.  PMH: Past Medical History  Diagnosis Date  . Hypertension   . Colonic polyp 2009  . MVP (mitral valve prolapse)     Dr.Brackbill/Cardiology  . Migraine   . 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 Hyperlipemia     see NMR data  . DVT (deep venous thrombosis)     LLE 2007 (post trauma and prolonged air travel), Lovenox x 6 months  . Kidney stone     PSH: Past Surgical History  Procedure Laterality Date  . Oophorectomy      3  benign cysts  . Nasal sinus surgery  2009    polyps;Dr.Rosen  . Tonsillectomy    . Colonoscopy w/ polypectomy  2009    ? [facility]    Allergies: Allergies  Allergen Reactions  . Montelukast Sodium Palpitations    Irregular heart beat     Medications: No prescriptions prior to admission     Social History: History   Social History  . Marital Status: Married    Spouse Name: N/A    Number of Children: N/A  . Years of Education: N/A   Occupational History  . Not on file.   Social History Main Topics  . Smoking status: Former Smoker    Quit date: 10/19/1980  . Smokeless tobacco: Not on file     Comment: Positive history of passive tobacco smoke exposure until 1990  . Alcohol Use: Yes     Comment: Social alcohol  4 / week  . Drug Use: No  . Sexually Active: Not on file   Other Topics Concern  . Not on file   Social History Narrative  . No narrative on file    Family History: Family History  Problem Relation Age of Onset  . Dementia Father   . Asthma Father   . Depression Father   . Alcohol abuse Father   . Hypotension Father   . Hypertension Mother     MGM   . Depression Mother   . Hypertension Sister   . Alzheimer's disease Paternal Uncle   . Ovarian cancer Maternal Aunt   . Diabetes Neg Hx   . Stroke Neg Hx   . Depression Sister     X 2  . Alcohol abuse Sister     X 2    Review of Systems: Positive: None Negative: Fever, chest  pain, or SOB.  A further 10 point review of systems was negative except what is listed in the HPI.  Physical Exam: Filed Vitals:   02/13/13 1036  BP: 137/81  Pulse: 82  Temp: 98.5 F (36.9 C)  Resp: 18    General: No acute distress.  Awake. Head:  Normocephalic.  Atraumatic. ENT:  EOMI.  Mucous membranes moist Neck:  Supple.  No lymphadenopathy. CV:  S1 present. S2 present. Regular rate. Pulmonary: Equal effort bilaterally.  Clear to auscultation bilaterally. Abdomen: Soft.  Non- tender to palpation. Skin:  Normal turgor.  No visible rash. Extremity: No gross deformity of bilateral upper extremities.  No gross deformity of    bilateral lower extremities. Neurologic: Alert. Appropriate mood.    Studies:  No results found for this basename:  HGB, WBC, PLT,  in the last 72 hours  No results found for this basename: NA, K, CL, CO2, BUN, CREATININE, CALCIUM, MAGNESIUM, GFRNONAA, GFRAA,  in the last 72 hours   No results found for this basename: PT, INR, APTT,  in the last 72 hours   No components found with this basename: ABG,     Assessment:  Left nephrolithiasis  Plan: Left sided stone not visualized on KUB or fluoroscopy today.  SWL cancelled. F/u in clinic in 2 weeks w/ KUB and left renal US. Patient given warnings regarding failure to follow up.

## 2013-03-09 ENCOUNTER — Ambulatory Visit: Admitting: Internal Medicine

## 2013-03-15 ENCOUNTER — Ambulatory Visit: Admitting: Internal Medicine

## 2013-03-29 ENCOUNTER — Encounter: Payer: Self-pay | Admitting: Internal Medicine

## 2013-03-30 ENCOUNTER — Encounter: Payer: Self-pay | Admitting: Internal Medicine

## 2013-03-30 NOTE — Telephone Encounter (Signed)
Noted  

## 2013-04-14 ENCOUNTER — Other Ambulatory Visit: Payer: Self-pay | Admitting: Urology

## 2013-04-25 ENCOUNTER — Encounter: Payer: Self-pay | Admitting: Internal Medicine

## 2013-04-25 ENCOUNTER — Other Ambulatory Visit: Payer: Self-pay | Admitting: Internal Medicine

## 2013-04-25 DIAGNOSIS — M109 Gout, unspecified: Secondary | ICD-10-CM

## 2013-04-26 ENCOUNTER — Other Ambulatory Visit: Payer: Self-pay | Admitting: Internal Medicine

## 2013-04-26 DIAGNOSIS — K862 Cyst of pancreas: Secondary | ICD-10-CM

## 2013-04-26 DIAGNOSIS — N200 Calculus of kidney: Secondary | ICD-10-CM

## 2013-05-02 ENCOUNTER — Ambulatory Visit (INDEPENDENT_AMBULATORY_CARE_PROVIDER_SITE_OTHER): Admitting: Internal Medicine

## 2013-05-02 ENCOUNTER — Encounter: Payer: Self-pay | Admitting: Internal Medicine

## 2013-05-02 VITALS — BP 112/68 | HR 90 | Ht 65.5 in | Wt 144.2 lb

## 2013-05-02 DIAGNOSIS — J45909 Unspecified asthma, uncomplicated: Secondary | ICD-10-CM

## 2013-05-02 DIAGNOSIS — J45998 Other asthma: Secondary | ICD-10-CM

## 2013-05-02 DIAGNOSIS — J0100 Acute maxillary sinusitis, unspecified: Secondary | ICD-10-CM

## 2013-05-02 MED ORDER — DOXYCYCLINE HYCLATE 100 MG PO TABS: ORAL_TABLET | ORAL | Status: DC

## 2013-05-02 MED ORDER — ALBUTEROL SULFATE HFA 108 (90 BASE) MCG/ACT IN AERS: 2.0000 | INHALATION_SPRAY | Freq: Four times a day (QID) | RESPIRATORY_TRACT | Status: DC | PRN

## 2013-05-02 NOTE — Progress Notes (Signed)
Subjective:    Patient ID: Debbie Hogan, female    DOB: Oct 13, 1951, 63 y.o.   MRN: [id number]  HPI 03/06/11- 13 yoF never smoker, followed for asthma, hx DVT, HBP, hx APBA with abnormal cxr. Originally met years ago, presenting with lung nodules of retained mucus- never proved ABPA. Last here- January 28, 2010 after a pneumonia 2 months prior. EOS were 11.6%, down from 28 %.  Currently she says she feels "great" and feels well today. She had used her standby doxy script in the Fall, stopping a sinus infection early.  She comments she has had no sense of smell in years. When on prednisone regularly she got it back for awhile. Flonase wasn't sufficient for this.. Occasional bolus of mucus felt as  postnasal drip. Did not improve after nasal polypectomy. Advair 100 didn't quite hold her, but Advair 250 once daily has done well.   03/08/12- 61 yoF never smoker, followed for asthma, hx DVT, HBP, hx APBA with abnormal cxr, nasal polyps. Originally met years ago, presenting with lung nodules of retained mucus- never proved ABPA. We reviewed old CXR reports from first in 2004 to complete clearing. Some tightness if skips her once daily Advair 250- interested in trying Advair 100. Denies nasal congestion as she continues Flonase w/o epistaxis. Hx nasal polypectomy, no aspirin allergy. No recurrence of DVT  05/02/13- 61 yoF never smoker, followed for asthma, hx DVT, HBP, hx APBA with abnormal cxr, nasal polyps. Originally met years ago, presenting with lung nodules of retained mucus- never proved ABPA Was doing really well until a few days ago.  c/o green mucus from sinuses, sinus pressure, HA, PND, hoarseness, and dry cough yesterday.  Urgent Care Rx'd doxy-got well, then flew, now relapse. Frontal HA, green w/ neti pot, hack cough butr no wheeze or fever.  CXR 08/09/12 IMPRESSION:  Stable examination. No acute cardiopulmonary process.  Original Report Authenticated By: Gerrianne Scale, M.D.  ROS-see  HPI Constitutional:   No-   weight loss, night sweats, fevers, chills, fatigue, lassitude. HEENT:   + headaches, difficulty swallowing, tooth/dental problems, sore throat,       No-  sneezing, itching, ear ache, +nasal congestion, post nasal drip,  CV:  No-   chest pain, orthopnea, PND, swelling in lower extremities, anasarca,   dizziness, palpitations Resp: No-   shortness of breath with exertion or at rest.              No-   productive cough,  + non-productive cough,  No- coughing up of blood.              +   change in color of mucus.  No- wheezing.   Skin: No-   rash or lesions. GI:  No-   heartburn, indigestion, abdominal pain, nausea, vomiting,  GU:  MS:  No-   joint pain or swelling.   Neuro-     nothing unusual Psych:  No- change in mood or affect. No depression or anxiety.  No memory loss.  OBJ- Physical Exam General- Alert, Oriented, Affect-appropriate, Distress- none acute, WDWN Skin- rash-none, lesions- none, excoriation- none Lymphadenopathy- none Head- atraumatic            Eyes- Gross vision intact, PERRLA, conjunctivae and secretions clear            Ears- Hearing, canals-normal            Nose- +Nasal polyp on right, no-Septal dev, mucus, erosion, perforation  Throat- Mallampati II , mucosa clear , drainage- none, tonsils- atrophic Neck- flexible , trachea midline, no stridor , thyroid nl, carotid no bruit Chest - symmetrical excursion , unlabored           Heart/CV- RRR , no murmur , no gallop  , no rub, nl s1 s2                           - JVD- none , edema- none, stasis changes- none, varices- none           Lung- clear to P&A, wheeze- none, cough- none , dullness-none, rub- none           Chest wall-  Abd-  Br/ Gen/ Rectal- Not done, not indicated Extrem- cyanosis- none, clubbing, none, atrophy- none, strength- nl Neuro- grossly intact to observation

## 2013-05-02 NOTE — Patient Instructions (Addendum)
Scripts sent for doxycyline and the albuterol rescue inhaler  The Neti pot is great if it helps you  We can continue Advair 250  Please call as needed

## 2013-05-04 ENCOUNTER — Encounter: Payer: Self-pay | Admitting: Internal Medicine

## 2013-05-04 NOTE — Addendum Note (Signed)
Addended by: Candie Echevaria L on: 05/04/2013 03:04 PM   Modules accepted: Orders

## 2013-05-04 NOTE — Telephone Encounter (Signed)
Hopp had triage assistant place order

## 2013-05-08 ENCOUNTER — Ambulatory Visit
Admission: RE | Admit: 2013-05-08 | Discharge: 2013-05-08 | Disposition: A | Discharge status: Home / Self Care | Source: Ambulatory Visit | Attending: Internal Medicine | Admitting: Internal Medicine

## 2013-05-08 DIAGNOSIS — N200 Calculus of kidney: Secondary | ICD-10-CM

## 2013-05-08 DIAGNOSIS — K862 Cyst of pancreas: Secondary | ICD-10-CM

## 2013-05-08 MED ORDER — GADOBENATE DIMEGLUMINE 529 MG/ML IV SOLN
13.0000 mL | Freq: Once | INTRAVENOUS | Status: AC | PRN
  Administered 2013-05-08: 13 mL via INTRAVENOUS

## 2013-05-09 ENCOUNTER — Encounter: Payer: Self-pay | Admitting: Internal Medicine

## 2013-05-15 ENCOUNTER — Other Ambulatory Visit

## 2013-05-17 ENCOUNTER — Encounter: Payer: Self-pay | Admitting: Internal Medicine

## 2013-05-17 ENCOUNTER — Encounter (HOSPITAL_COMMUNITY): Admission: RE | Payer: Self-pay | Source: Ambulatory Visit

## 2013-05-17 ENCOUNTER — Ambulatory Visit (HOSPITAL_COMMUNITY): Admission: RE | Admit: 2013-05-17 | Source: Ambulatory Visit | Admitting: Urology

## 2013-05-17 SURGERY — CYSTOURETEROSCOPY, WITH RETROGRADE PYELOGRAM AND STENT INSERTION
Anesthesia: General | Laterality: Left

## 2013-05-17 NOTE — Telephone Encounter (Signed)
[facility]opp please advise  

## 2013-05-18 DIAGNOSIS — J0100 Acute maxillary sinusitis, unspecified: Secondary | ICD-10-CM | POA: Insufficient documentation

## 2013-05-18 NOTE — Assessment & Plan Note (Signed)
Good control now usually w/ Advair

## 2013-05-18 NOTE — Assessment & Plan Note (Signed)
Hx nasal polyps, not seen now. Responded well earlier to doxy. We can retry this, but explained may need to change. Saline rinse. Decongestant.

## 2013-05-23 ENCOUNTER — Telehealth: Payer: Self-pay | Admitting: *Deleted

## 2013-05-23 ENCOUNTER — Other Ambulatory Visit: Payer: Self-pay | Admitting: *Deleted

## 2013-05-23 MED ORDER — PROGESTERONE MICRONIZED 200 MG PO CAPS: 200.0000 mg | ORAL_CAPSULE | Freq: Every day | ORAL | Status: DC

## 2013-05-23 NOTE — Telephone Encounter (Signed)
Rx was refilled for 1 mo. Of Prometrium per Dr. Alfonse Flavors.

## 2013-06-14 ENCOUNTER — Other Ambulatory Visit: Payer: Self-pay

## 2013-06-14 DIAGNOSIS — Z1231 Encounter for screening mammogram for malignant neoplasm of breast: Secondary | ICD-10-CM

## 2013-07-05 ENCOUNTER — Ambulatory Visit
Admission: RE | Admit: 2013-07-05 | Discharge: 2013-07-05 | Disposition: A | Discharge status: Home / Self Care | Source: Ambulatory Visit

## 2013-07-05 DIAGNOSIS — Z1231 Encounter for screening mammogram for malignant neoplasm of breast: Secondary | ICD-10-CM

## 2013-07-12 ENCOUNTER — Telehealth: Payer: Self-pay | Admitting: Internal Medicine

## 2013-07-12 MED ORDER — FLUTICASONE-SALMETEROL 250-50 MCG/DOSE IN AEPB: 1.0000 | INHALATION_SPRAY | Freq: Two times a day (BID) | RESPIRATORY_TRACT | Status: DC

## 2013-07-12 MED ORDER — FLUTICASONE PROPIONATE 50 MCG/ACT NA SUSP: 2.0000 | Freq: Every day | NASAL | Status: DC

## 2013-07-12 NOTE — Telephone Encounter (Signed)
I spoke with pt and is aware refills have been sent. Nothing further needed

## 2013-07-17 ENCOUNTER — Telehealth: Payer: Self-pay | Admitting: Internal Medicine

## 2013-07-17 MED ORDER — FLUTICASONE-SALMETEROL 250-50 MCG/DOSE IN AEPB: 1.0000 | INHALATION_SPRAY | Freq: Two times a day (BID) | RESPIRATORY_TRACT | Status: DC

## 2013-07-17 NOTE — Telephone Encounter (Signed)
PT AWARE 1 SAMPLE LEFT UPFRONT FOR PICK UP. NOTHING FURTHER NEEDED

## 2013-07-20 ENCOUNTER — Other Ambulatory Visit: Payer: Self-pay | Admitting: Obstetrics and Gynecology

## 2013-08-03 ENCOUNTER — Other Ambulatory Visit: Payer: Self-pay | Admitting: Internal Medicine

## 2013-08-03 MED ORDER — FLUTICASONE-SALMETEROL 250-50 MCG/DOSE IN AEPB: 1.0000 | INHALATION_SPRAY | Freq: Two times a day (BID) | RESPIRATORY_TRACT | Status: DC

## 2013-08-18 ENCOUNTER — Other Ambulatory Visit: Payer: Self-pay | Admitting: Internal Medicine

## 2013-08-18 NOTE — Telephone Encounter (Signed)
Bupropion refill sent to pharmacy 

## 2013-08-24 ENCOUNTER — Other Ambulatory Visit: Payer: Self-pay

## 2013-08-25 ENCOUNTER — Telehealth: Payer: Self-pay

## 2013-08-25 NOTE — Telephone Encounter (Addendum)
Left message for call back  identifiable  Medication and allergies: reviewed and updated  90 day supply/mail order: [facility] Local pharmacy: [facility] [address]    Immunizations due:  Admin flu vaccine  A/P:   No changes to FH  D&C with neg path --approx 20 weeks ago CCS--due--polypectomy approx 2009 [facility]--06/2013--neg  To Discuss with Provider: Bump on neck that has come back

## 2013-08-28 ENCOUNTER — Ambulatory Visit (INDEPENDENT_AMBULATORY_CARE_PROVIDER_SITE_OTHER): Admitting: Internal Medicine

## 2013-08-28 ENCOUNTER — Other Ambulatory Visit: Payer: Self-pay | Admitting: Internal Medicine

## 2013-08-28 ENCOUNTER — Encounter: Payer: Self-pay | Admitting: Internal Medicine

## 2013-08-28 VITALS — BP 105/69 | HR 79 | Temp 98.2°F | Ht 66.25 in | Wt 144.8 lb

## 2013-08-28 DIAGNOSIS — Z0000 Encounter for general adult medical examination without abnormal findings: Secondary | ICD-10-CM

## 2013-08-28 DIAGNOSIS — R946 Abnormal results of thyroid function studies: Secondary | ICD-10-CM

## 2013-08-28 DIAGNOSIS — Z23 Encounter for immunization: Secondary | ICD-10-CM

## 2013-08-28 DIAGNOSIS — Z86010 Personal history of colonic polyps: Secondary | ICD-10-CM

## 2013-08-28 DIAGNOSIS — E785 Hyperlipidemia, unspecified: Secondary | ICD-10-CM

## 2013-08-28 LAB — LIPID PANEL
Cholesterol: 181 mg/dL (ref 0–200)
LDL Cholesterol: 95 mg/dL (ref 0–99)
Triglycerides: 135 mg/dL (ref 0.0–149.0)

## 2013-08-28 LAB — HEPATIC FUNCTION PANEL
ALT: 14 U/L (ref 0–35)
Albumin: 3.9 g/dL (ref 3.5–5.2)
Total Bilirubin: 0.4 mg/dL (ref 0.3–1.2)
Total Protein: 7 g/dL (ref 6.0–8.3)

## 2013-08-28 MED ORDER — LORAZEPAM 0.5 MG PO TABS: 0.5000 mg | ORAL_TABLET | Freq: Every day | ORAL | Status: DC | PRN

## 2013-08-28 NOTE — Progress Notes (Signed)
Pre visit review using our clinic review tool, if applicable. No additional management support is needed unless otherwise documented below in the visit note. 

## 2013-08-28 NOTE — Addendum Note (Signed)
Addended by: Verdie Shire on: 08/28/2013 09:37 AM   Modules accepted: Orders

## 2013-08-28 NOTE — Patient Instructions (Signed)
As per the Standard of Care , screening Colonoscopy recommended @ 50 & every 5-10 years thereafter . More frequent monitor would be dictated by family history or findings @ Colonoscopy. Verify when due with [facility]. Plain Mucinex (NOT D) for thick secretions ;force NON dairy fluids .   Nasal cleansing in the shower as discussed with lather of mild shampoo.After 10 seconds wash off lather while  exhaling through nostrils. Make sure that all residual soap is removed to prevent irritation.  Fluticasone 1 spray in each nostril twice a day as needed. Use the "crossover" technique into opposite nostril spraying toward opposite ear @ 45 degree angle, not straight up into nostril.  Use a Neti pot daily only  as needed for significant sinus congestion; going from open side to congested side . Plain Allegra (NOT D )  160 daily , Loratidine 10 mg , OR Zyrtec 10 mg @ bedtime  as needed for itchy eyes & sneezing. Your next office appointment will be determined based upon review of your pending labs . Those instructions will be transmitted to you through My Chart  .

## 2013-08-28 NOTE — Progress Notes (Signed)
  Subjective:    Patient ID: Debbie Hogan, female    DOB: [birth year], 62 y.o.   MRN: [id number]  HPI   She is here for a physical;acute issues include recent bronchitic flare.     Review of Systems  A modified heart healthy diet is followed; exercise encompasses > 60 minutes 4  times per week as walks & weights without symptoms.  Family history is negative for premature coronary disease. Advanced cholesterol testing reveals  LDL goal is less than 100 ; ideally < 70 . There is medication compliance with the statin.  Full dose ASA taken Specifically denied are  chest pain, palpitations, dyspnea, or claudication.  Significant abdominal symptoms, memory deficit, or myalgias not present.      Objective:   Physical Exam Gen.: Healthy and well-nourished in appearance. Alert, appropriate and cooperative throughout exam.Appears younger than stated age  Head: Normocephalic without obvious abnormalities Eyes: No corneal or conjunctival inflammation noted. Pupils equal round reactive to light and accommodation. Extraocular motion intact.  Ears: External  ear exam reveals no significant lesions or deformities. Canals clear .TMs normal. Hearing is grossly normal bilaterally. Nose: External nasal exam reveals no deformity or inflammation. Nasal mucosa are dry. No lesions or exudates noted.   Mouth: Oral mucosa and oropharynx reveal no lesions or exudates. Teeth in good repair. Neck: No deformities, masses, or tenderness noted. Range of motion & Thyroid  normal. Lungs: Normal respiratory effort; chest expands symmetrically. Lungs are clear to auscultation without rales, wheezes, or increased work of breathing. Heart: Normal rate and rhythm. Normal S1 and S2. No gallop, click, or rub. S4 w/o murmur. Abdomen: Bowel sounds normal; abdomen soft and nontender. No masses, organomegaly or hernias noted.Aorta palpable ; no AAA Genitalia:  as per Gyn                                  Musculoskeletal/extremities:  No significant deformity or scoliosis noted of  the thoracic or lumbar spine.   No clubbing, cyanosis, edema, or significant extremity  deformity noted. Range of motion normal .Tone & strength normal. Hand joints normal . Fingernail / toenail health good. Able to lie down & sit up w/o help. Negative SLR bilaterally Vascular: Carotid, radial artery, dorsalis pedis and  posterior tibial pulses are full and equal. Faint aortic bruit present. Neurologic: Alert and oriented x3. Deep tendon reflexes symmetrical and normal.        Skin: Intact without suspicious lesions or rashes.8 X 8 mm granuloma R posterior scalp line Lymph: No cervical, axillary lymphadenopathy present. Psych: Mood and affect are normal. Normally interactive                                                                                        Assessment & Plan:  #1 comprehensive physical exam; no acute findings  Plan: see Orders  & Recommendations

## 2013-09-04 ENCOUNTER — Telehealth: Payer: Self-pay | Admitting: *Deleted

## 2013-09-04 ENCOUNTER — Other Ambulatory Visit (INDEPENDENT_AMBULATORY_CARE_PROVIDER_SITE_OTHER)

## 2013-09-04 DIAGNOSIS — R946 Abnormal results of thyroid function studies: Secondary | ICD-10-CM

## 2013-09-04 DIAGNOSIS — M109 Gout, unspecified: Secondary | ICD-10-CM

## 2013-09-04 LAB — TSH: TSH: 2.65 u[IU]/mL (ref 0.35–5.50)

## 2013-09-04 NOTE — Telephone Encounter (Deleted)
09/04/2013  Pt came in this morning for lab work.

## 2013-09-04 NOTE — Telephone Encounter (Signed)
09/04/2013  Pt came in for lab work this morning.  She dropped off lab work she had done at [facility] she said we did not have on file.  I put her MRN on top right and put in [facility] folder for review.  bw

## 2013-09-14 ENCOUNTER — Other Ambulatory Visit: Payer: Self-pay | Admitting: Internal Medicine

## 2013-09-15 NOTE — Telephone Encounter (Signed)
Simvastatin refilled per protocol.  

## 2013-09-29 ENCOUNTER — Other Ambulatory Visit: Payer: Self-pay | Admitting: Internal Medicine

## 2013-10-04 ENCOUNTER — Encounter: Payer: Self-pay | Admitting: Internal Medicine

## 2013-10-29 ENCOUNTER — Other Ambulatory Visit: Payer: Self-pay | Admitting: Internal Medicine

## 2013-10-30 NOTE — Telephone Encounter (Signed)
Bupropion refilled per protocol. JG//CMA 

## 2013-11-03 ENCOUNTER — Encounter: Payer: Self-pay | Admitting: Internal Medicine

## 2013-11-05 ENCOUNTER — Other Ambulatory Visit: Payer: Self-pay | Admitting: Internal Medicine

## 2013-11-05 MED ORDER — RIVAROXABAN 10 MG PO TABS: 10.0000 mg | ORAL_TABLET | Freq: Every day | ORAL | Status: DC

## 2013-11-05 MED ORDER — ATOVAQUONE-PROGUANIL HCL 250-100 MG PO TABS: 1.0000 | ORAL_TABLET | Freq: Every day | ORAL | Status: DC

## 2013-11-06 NOTE — Telephone Encounter (Signed)
Called patient back to schedule an office visit.

## 2013-11-06 NOTE — Telephone Encounter (Signed)
Message copied by Kingsley Spittle on Mon Nov 06, 2013 10:44 AM ------      Message from: Hendricks Limes      Created: Sun Nov 05, 2013 11:12 AM       She should make appt to determine dose of Xarelto samples ------

## 2013-11-08 ENCOUNTER — Encounter: Payer: Self-pay | Admitting: Internal Medicine

## 2013-11-08 ENCOUNTER — Ambulatory Visit (INDEPENDENT_AMBULATORY_CARE_PROVIDER_SITE_OTHER): Admitting: Internal Medicine

## 2013-11-08 VITALS — BP 106/69 | HR 72 | Temp 98.1°F | Resp 12 | Wt 144.0 lb

## 2013-11-08 DIAGNOSIS — I82409 Acute embolism and thrombosis of unspecified deep veins of unspecified lower extremity: Secondary | ICD-10-CM

## 2013-11-08 DIAGNOSIS — Z0289 Encounter for other administrative examinations: Secondary | ICD-10-CM

## 2013-11-08 NOTE — Patient Instructions (Signed)
   Prophylaxis for deep venous thrombosis with Xarelto 20 mg one half pill a day prior to flight, day of flight, and the day of arrival in [place]. Repeat this process on return trip.  Should you develop pain and swelling in the calf suggestive of deep venous thrombosis take Xarelto to 15 mg twice a day every day until return.

## 2013-11-08 NOTE — Assessment & Plan Note (Signed)
Xarelto prophylaxis

## 2013-11-08 NOTE — Progress Notes (Signed)
   Subjective:    Patient ID: Debbie Hogan, female    DOB: Jul 03, 1951, 63 y.o.   MRN: [id number]  HPI   She will be flying for 20 hours to [place] and will be there for 10 days. She had deep venous thrombosis in 2007 in the context of local trauma and subsequent intra-continental air travel. Since that time she's used Lovenox prophylaxis 2 shots on flight outgoing and on the return flight  There is no past medical history atrial fibrillation; she has had palpitations with generic similar. There is no family history of coagulopathy.    Review of Systems  She is asymptomatic from a cardiopulmonary standpoint. She specifically denies chest pain, palpitations, or dyspnea.     Objective:   Physical Exam   Appears healthy and well-nourished & in no acute distress  No carotid bruits are present.No neck pain distention present at 10 - 15 degrees. Thyroid normal to palpation  Heart rhythm and rate are normal with no significant murmurs or gallops.  Chest is clear with no increased work of breathing  There is no evidence of aortic aneurysm or renal artery bruits  Abdomen soft with no organomegaly or masses. No HJR.Aorta palpable; bruit present ; no AAA  No clubbing, cyanosis or edema present. Homans sign negative bilaterally  Pedal pulses are intact   No ischemic skin changes are present . Nails healthy   Alert and oriented. Strength, tone normal         Assessment & Plan:

## 2013-11-14 ENCOUNTER — Telehealth: Payer: Self-pay | Admitting: *Deleted

## 2013-11-14 ENCOUNTER — Other Ambulatory Visit: Payer: Self-pay | Admitting: *Deleted

## 2013-11-14 MED ORDER — ATOVAQUONE-PROGUANIL HCL 250-100 MG PO TABS: 1.0000 | ORAL_TABLET | Freq: Every day | ORAL | Status: DC

## 2013-11-14 NOTE — Telephone Encounter (Signed)
Patient called and stated that her pharmacy still has not received atovaquone-proguanil (MALARONE) [phone] [place], [place]

## 2013-11-14 NOTE — Telephone Encounter (Signed)
Medication e-scribed to pharmacy. JG//CMA 

## 2013-12-09 ENCOUNTER — Encounter: Payer: Self-pay | Admitting: Internal Medicine

## 2013-12-11 ENCOUNTER — Encounter: Payer: Self-pay | Admitting: Internal Medicine

## 2014-01-10 ENCOUNTER — Other Ambulatory Visit: Payer: Self-pay | Admitting: Internal Medicine

## 2014-02-08 ENCOUNTER — Other Ambulatory Visit: Payer: Self-pay | Admitting: Internal Medicine

## 2014-02-23 ENCOUNTER — Other Ambulatory Visit: Payer: Self-pay | Admitting: Internal Medicine

## 2014-05-02 ENCOUNTER — Ambulatory Visit: Admitting: Internal Medicine

## 2014-05-23 ENCOUNTER — Telehealth: Payer: Self-pay | Admitting: Internal Medicine

## 2014-05-23 MED ORDER — DOXYCYCLINE HYCLATE 100 MG PO TABS: 100.0000 mg | ORAL_TABLET | Freq: Two times a day (BID) | ORAL | Status: DC

## 2014-05-23 NOTE — Telephone Encounter (Signed)
The only med I have seen from the chart that Dr. Annamaria Boots has given her to hold is doxy. Ok to send in doxy 100mg , bid for 5 days if needed for sinus infection.

## 2014-05-23 NOTE — Telephone Encounter (Signed)
Called spoke with pt. Last seen 04/2013 by CDY and has pending appt 06/05/14 w/ CDY. She is leaving going to [place] tomorrow. Wants RX for leavquint o have on hand incase she gets into trouble. Please advise Crystal as aCDY is off. thanks

## 2014-05-23 NOTE — Telephone Encounter (Signed)
Pt aware of recs. RX for doxy sent in. Nothing further needed

## 2014-05-24 ENCOUNTER — Other Ambulatory Visit: Payer: Self-pay

## 2014-05-24 MED ORDER — BUPROPION HCL ER (XL) 300 MG PO TB24: ORAL_TABLET | ORAL | Status: DC

## 2014-06-05 ENCOUNTER — Encounter: Payer: Self-pay | Admitting: Internal Medicine

## 2014-06-05 ENCOUNTER — Ambulatory Visit: Admitting: Internal Medicine

## 2014-06-05 VITALS — BP 124/80 | HR 70 | Ht 65.5 in | Wt 144.4 lb

## 2014-06-05 DIAGNOSIS — J019 Acute sinusitis, unspecified: Secondary | ICD-10-CM

## 2014-06-05 MED ORDER — FLUTICASONE PROPIONATE 50 MCG/ACT NA SUSP: 2.0000 | Freq: Every day | NASAL | Status: DC

## 2014-06-05 MED ORDER — FLUTICASONE-SALMETEROL 250-50 MCG/DOSE IN AEPB: INHALATION_SPRAY | RESPIRATORY_TRACT | Status: DC

## 2014-06-05 MED ORDER — DOXYCYCLINE HYCLATE 100 MG PO TABS: ORAL_TABLET | ORAL | Status: DC

## 2014-06-05 MED ORDER — DOXYCYCLINE HYCLATE 100 MG PO TABS: 100.0000 mg | ORAL_TABLET | Freq: Two times a day (BID) | ORAL | Status: DC

## 2014-06-05 MED ORDER — ALBUTEROL SULFATE HFA 108 (90 BASE) MCG/ACT IN AERS: 2.0000 | INHALATION_SPRAY | Freq: Four times a day (QID) | RESPIRATORY_TRACT | Status: DC | PRN

## 2014-06-05 NOTE — Progress Notes (Signed)
Subjective:    Patient ID: Debbie Hogan, female    DOB: Sep 22, 1951, 63 y.o.   MRN: [id number]  HPI 03/06/11- 63 yoF never smoker, followed for asthma, hx DVT, HBP, hx APBA with abnormal cxr. Originally met years ago, presenting with lung nodules of retained mucus- never proved ABPA. Last here- January 28, 2010 after a pneumonia 2 months prior. EOS were 11.6%, down from 28 %.  Currently she says she feels "great" and feels well today. She had used her standby doxy script in the Fall, stopping a sinus infection early.  She comments she has had no sense of smell in years. When on prednisone regularly she got it back for awhile. Flonase wasn't sufficient for this.. Occasional bolus of mucus felt as  postnasal drip. Did not improve after nasal polypectomy. Advair 100 didn't quite hold her, but Advair 250 once daily has done well.   03/08/12- 63 yoF never smoker, followed for asthma, hx DVT, HBP, hx APBA with abnormal cxr, nasal polyps. Originally met years ago, presenting with lung nodules of retained mucus- never proved ABPA. We reviewed old CXR reports from first in 2004 to complete clearing. Some tightness if skips her once daily Advair 250- interested in trying Advair 100. Denies nasal congestion as she continues Flonase w/o epistaxis. Hx nasal polypectomy, no aspirin allergy. No recurrence of DVT  05/02/13- 63 yoF never smoker, followed for asthma, hx DVT, HBP, hx APBA with abnormal cxr, nasal polyps. Originally met years ago, presenting with lung nodules of retained mucus- never proved ABPA Was doing really well until a few days ago.  c/o green mucus from sinuses, sinus pressure, HA, PND, hoarseness, and dry cough yesterday.  Urgent [facility] Rx'd doxy-got well, then flew, now relapse. Frontal HA, green w/ neti pot, hack cough butr no wheeze or fever.  CXR 08/09/12 IMPRESSION:  Stable examination. No acute cardiopulmonary process.  Original Report Authenticated By: Vivia Ewing, M.D.  06/05/14- 63  yoF never smoker, followed for asthma, hx DVT, HBP, hx APBA with abnormal cxr, nasal polyps. Originally met years ago, presenting with lung nodules of retained mucus- never proved ABPA FOLLOWS FOR: has had an overall good year until 1 month ago-flare up of congestion-currently on Doxycycline and getting better     ROS-see HPI Constitutional:   No-   weight loss, night sweats, fevers, chills, fatigue, lassitude. HEENT:   + headaches, difficulty swallowing, tooth/dental problems, sore throat,       No-  sneezing, itching, ear ache, +nasal congestion, post nasal drip,  CV:  No-   chest pain, orthopnea, PND, swelling in lower extremities, anasarca,   dizziness, palpitations Resp: No-   shortness of breath with exertion or at rest.              No-   productive cough,  + non-productive cough,  No- coughing up of blood.              +   change in color of mucus.  No- wheezing.   Skin: No-   rash or lesions. GI:  No-   heartburn, indigestion, abdominal pain, nausea, vomiting,  GU:  MS:  No-   joint pain or swelling.   Neuro-     nothing unusual Psych:  No- change in mood or affect. No depression or anxiety.  No memory loss.  OBJ- Physical Exam General- Alert, Oriented, Affect-appropriate, Distress- none acute, WDWN Skin- rash-none, lesions- none, excoriation- none Lymphadenopathy- none Head- atraumatic  Eyes- Gross vision intact, PERRLA, conjunctivae and secretions clear            Ears- Hearing, canals-normal            Nose- +Nasal polyp on right, no-Septal dev, mucus, erosion, perforation             Throat- Mallampati II , mucosa clear , drainage- none, tonsils- atrophic Neck- flexible , trachea midline, no stridor , thyroid nl, carotid no bruit Chest - symmetrical excursion , unlabored           Heart/CV- RRR , no murmur , no gallop  , no rub, nl s1 s2                           - JVD- none , edema- none, stasis changes- none, varices- none           Lung- clear to P&A,  wheeze- none, cough- none , dullness-none, rub- none           Chest wall-  Abd-  Br/ Gen/ Rectal- Not done, not indicated Extrem- cyanosis- none, clubbing, none, atrophy- none, strength- nl Neuro- grossly intact to observation

## 2014-06-05 NOTE — Patient Instructions (Signed)
Script sent to [facility] for albuterol rescue inhaler, Advair and Flonase  Script printed for doxycycline

## 2014-07-02 ENCOUNTER — Other Ambulatory Visit: Payer: Self-pay | Admitting: Internal Medicine

## 2014-08-27 ENCOUNTER — Ambulatory Visit (INDEPENDENT_AMBULATORY_CARE_PROVIDER_SITE_OTHER)

## 2014-08-27 ENCOUNTER — Ambulatory Visit (INDEPENDENT_AMBULATORY_CARE_PROVIDER_SITE_OTHER): Admitting: Internal Medicine

## 2014-08-27 VITALS — BP 140/82 | HR 77 | Temp 98.3°F | Resp 16 | Ht 65.5 in | Wt 149.0 lb

## 2014-08-27 DIAGNOSIS — M25522 Pain in left elbow: Secondary | ICD-10-CM

## 2014-08-27 DIAGNOSIS — M7022 Olecranon bursitis, left elbow: Secondary | ICD-10-CM

## 2014-08-27 MED ORDER — MELOXICAM 15 MG PO TABS: 15.0000 mg | ORAL_TABLET | Freq: Every day | ORAL | Status: DC

## 2014-08-27 NOTE — Progress Notes (Signed)
   Subjective:    Patient ID: Debbie Hogan, female    DOB: May 20, 1951, 63 y.o.   MRN: [id number]  This chart was scribed for Tami Lin, MD by Erling Conte, Medical Scribe. This patient was seen in Room 2 and the patient's care was started at 9:57 PM.   Chief Complaint  Patient presents with  . Elbow Injury    left 3 weeks    HPI HPI Comments: Debbie Hogan is a 63 y.o. female who presents to the [facility] complaining of. an injury to her left elbow that occurred 3 weeks ago. Pt states that she fell in the shower about 3 weeks ago and has had ongoing pain ever since. She notes associated swelling, pain, redness and warmth to the touch of her left elbow. The pain is exacerbated by putting pressure on her left elbow. Denies any fever, chills, nausea, or vomiting.   Review of Systems  Constitutional: Negative for fever and chills.  Gastrointestinal: Negative for nausea and vomiting.  Musculoskeletal: Positive for joint swelling (left elbow) and arthralgias (left elbow).  Skin: Positive for color change (redness to left elbow).  All other systems reviewed and are negative.      Objective:   Physical Exam  Constitutional: She is oriented to person, place, and time. She appears well-developed and well-nourished. No distress.  HENT:  Head: Normocephalic and atraumatic.  Eyes: Conjunctivae and EOM are normal.  Neck: Neck supple. No tracheal deviation present.  Cardiovascular: Normal rate.   Pulmonary/Chest: Effort normal. No respiratory distress.  Musculoskeletal: Normal range of motion. She exhibits tenderness.       Left forearm: She exhibits tenderness and swelling.  L elbow: slightly red, slighty warm to touch and slightly swollen with fluctuance of olecranon bursa.   Neurological: She is alert and oriented to person, place, and time.  Skin: Skin is warm and dry.  Psychiatric: She has a normal mood and affect. Her behavior is normal.  Nursing note  and vitals reviewed.   Filed Vitals:   08/27/14 2053  BP: 140/82  Pulse: 77  Temp: 98.3 F (36.8 C)  TempSrc: Oral  Resp: 16  Height: 5' 5.5" (1.664 m)  Weight: 149 lb (67.586 kg)  SpO2: 95%   [facility] reading (PRIMARY) by  Dr. Afifa Truax=No fx//Ca++ in olec bursa      Assessment & Plan:  Pain, elbow joint, left - Plan: DG Elbow 2 Views Left  Olecranon bursitis of left elbow  Meds ordered this encounter  Medications  . meloxicam (MOBIC) 15 MG tablet    Sig: Take 1 tablet (15 mg total) by mouth daily.    Dispense:  30 tablet    Refill:  0  f/u if incr red  I have completed the patient encounter in its entirety as documented by the scribe, with editing by me where necessary. Sabra Sessler P. Laney Pastor, M.D.

## 2014-08-31 ENCOUNTER — Other Ambulatory Visit: Payer: Self-pay

## 2014-08-31 DIAGNOSIS — Z1231 Encounter for screening mammogram for malignant neoplasm of breast: Secondary | ICD-10-CM

## 2014-09-07 ENCOUNTER — Other Ambulatory Visit: Payer: Self-pay | Admitting: Internal Medicine

## 2014-09-10 NOTE — Telephone Encounter (Signed)
Ok X 3 mos 

## 2014-09-21 ENCOUNTER — Ambulatory Visit

## 2014-10-02 ENCOUNTER — Ambulatory Visit
Admission: RE | Admit: 2014-10-02 | Discharge: 2014-10-02 | Disposition: A | Discharge status: Home / Self Care | Source: Ambulatory Visit

## 2014-10-02 DIAGNOSIS — Z1231 Encounter for screening mammogram for malignant neoplasm of breast: Secondary | ICD-10-CM

## 2014-10-16 ENCOUNTER — Encounter: Admitting: Internal Medicine

## 2014-10-22 ENCOUNTER — Encounter: Payer: Self-pay | Admitting: Internal Medicine

## 2014-10-22 ENCOUNTER — Ambulatory Visit (INDEPENDENT_AMBULATORY_CARE_PROVIDER_SITE_OTHER)

## 2014-10-22 ENCOUNTER — Ambulatory Visit (INDEPENDENT_AMBULATORY_CARE_PROVIDER_SITE_OTHER): Admitting: Internal Medicine

## 2014-10-22 VITALS — BP 130/90 | HR 75 | Temp 97.7°F | Resp 14 | Ht 65.5 in | Wt 146.1 lb

## 2014-10-22 DIAGNOSIS — Z23 Encounter for immunization: Secondary | ICD-10-CM

## 2014-10-22 DIAGNOSIS — Z0189 Encounter for other specified special examinations: Secondary | ICD-10-CM

## 2014-10-22 DIAGNOSIS — K862 Cyst of pancreas: Secondary | ICD-10-CM

## 2014-10-22 DIAGNOSIS — Z0000 Encounter for general adult medical examination without abnormal findings: Secondary | ICD-10-CM

## 2014-10-22 DIAGNOSIS — E785 Hyperlipidemia, unspecified: Secondary | ICD-10-CM

## 2014-10-22 MED ORDER — HYDROCODONE-HOMATROPINE 5-1.5 MG/5ML PO SYRP: 5.0000 mL | ORAL_SOLUTION | Freq: Four times a day (QID) | ORAL | Status: DC | PRN

## 2014-10-22 NOTE — Progress Notes (Signed)
Subjective:    Patient ID: Debbie Hogan, female    DOB: [birth year], 64 y.o.   MRN: [id number]  HPI  She is here for a physical;acute issues discussed in ROS.  She has been compliant with her medicines without adverse effects  She is not monitoring her blood pressure  [facility] without [facility]opulmonary symptoms  She is on a heart healthy diet  Advanced cholesterol testing indicates that her LDL goal would be less than 100, ideally less than 70  She has had hyperplastic polyp and is due for follow-up in 2019. 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 She is overdue for follow-up with MRI.    Review of Systems She has had some tinnitus; no hearing loss reported . Some associated occasional lightheadedness w/o frank vertigo.  Significant headaches, epistaxis, chest pain, palpitations, exertional dyspnea, claudication, paroxysmal nocturnal dyspnea, or edema absent.  No GI symptoms , memory loss or myalgias  Unexplained weight loss, abdominal pain, significant dyspepsia, dysphagia, melena, rectal bleeding, or persistently small caliber stools are denied.     Objective:   Physical Exam  Gen.: Healthy and well-nourished in appearance. Alert, appropriate and cooperative throughout exam. Appears younger than stated age  Head: Normocephalic without obvious abnormalities  Eyes: No corneal or conjunctival inflammation noted. Pupils equal round reactive to light and accommodation. Extraocular motion intact.  Ears: External  ear exam reveals no significant lesions or deformities.Some wax bilaterally. Hearing is grossly normal bilaterally. Nose: External nasal exam reveals no deformity or inflammation. Nasal mucosa are pink and moist. No lesions or exudates noted.   Mouth: Oral mucosa and oropharynx reveal no lesions or exudates. Teeth in good repair. Neck: No deformities,  masses, or tenderness noted. Range of motion & Thyroid normal. Lungs: Normal respiratory effort; chest expands symmetrically.Isolated expiratory wheezing LUL posteriorly.No increased work of breathing. Heart: Normal rate and rhythm. Normal S1 and S2. No gallop, click, or rub. No murmur. Abdomen: Bowel sounds normal; abdomen soft and nontender. No masses, organomegaly or hernias noted. Genitalia: as per Gyn                                  Musculoskeletal/extremities: No deformity or scoliosis noted of  the thoracic or lumbar spine.  No clubbing, cyanosis, edema, or significant extremity  deformity noted.  Range of motion normal . Tone & strength normal. Hand joints normal Fingernail  health good. Able to lie down & sit up w/o help.  Negative SLR bilaterally Vascular: Carotid, radial artery, dorsalis pedis and  posterior tibial pulses are full and equal. No bruits present. Neurologic: Alert and oriented x3. Deep tendon reflexes symmetrical and normal.  Gait normal.     Skin: Intact without suspicious lesions or rashes.1X1 cm transilluminating SQ cyst R posterolateral neck. Lymph: No cervical, axillary, or inguinal lymphadenopathy present. Psych: Mood and affect are normal. Normally interactive  Assessment & Plan:  #1 comprehensive physical exam; no acute findings  Plan: see Orders  & Recommendations

## 2014-10-22 NOTE — Progress Notes (Signed)
Pre visit review using our clinic review tool, if applicable. No additional management support is needed unless otherwise documented below in the visit note. 

## 2014-10-22 NOTE — Patient Instructions (Addendum)
Your next office appointment will be determined based upon review of your pending labs & MRI. Those instructions will be transmitted to you through My Chart    Plain Mucinex (NOT D) for thick secretions ;force NON dairy fluids .   Nasal cleansing in the shower as discussed with lather of mild shampoo.After 10 seconds wash off lather while  exhaling through nostrils. Make sure that all residual soap is removed to prevent irritation.  Flonase OR Nasacort AQ 1 spray in each nostril twice a day as needed. Use the "crossover" technique into opposite nostril spraying toward opposite ear @ 45 degree angle, not straight up into nostril.  Plain Allegra (NOT D )  160 daily , Loratidine 10 mg , OR Zyrtec 10 mg @ bedtime  as needed for itchy eyes & sneezing.

## 2014-10-23 ENCOUNTER — Other Ambulatory Visit: Payer: Self-pay | Admitting: Internal Medicine

## 2014-10-23 ENCOUNTER — Other Ambulatory Visit (INDEPENDENT_AMBULATORY_CARE_PROVIDER_SITE_OTHER)

## 2014-10-23 DIAGNOSIS — Z0000 Encounter for general adult medical examination without abnormal findings: Secondary | ICD-10-CM

## 2014-10-23 DIAGNOSIS — Z0189 Encounter for other specified special examinations: Secondary | ICD-10-CM

## 2014-10-23 LAB — CBC WITH DIFFERENTIAL/PLATELET
BASOS PCT: 0.8 % (ref 0.0–3.0)
Basophils Absolute: 0.1 10*3/uL (ref 0.0–0.1)
EOS PCT: 17 % — AB (ref 0.0–5.0)
Eosinophils Absolute: 2 10*3/uL — ABNORMAL HIGH (ref 0.0–0.7)
HCT: 45.1 % (ref 36.0–46.0)
Hemoglobin: 14.9 g/dL (ref 12.0–15.0)
Lymphocytes Relative: 23.6 % (ref 12.0–46.0)
Lymphs Abs: 2.8 10*3/uL (ref 0.7–4.0)
MCHC: 33 g/dL (ref 30.0–36.0)
MCV: 89.9 fl (ref 78.0–100.0)
MONOS PCT: 5 % (ref 3.0–12.0)
Monocytes Absolute: 0.6 10*3/uL (ref 0.1–1.0)
NEUTROS PCT: 53.6 % (ref 43.0–77.0)
Neutro Abs: 6.4 10*3/uL (ref 1.4–7.7)
Platelets: 380 10*3/uL (ref 150.0–400.0)
RBC: 5.01 Mil/uL (ref 3.87–5.11)
RDW: 13.6 % (ref 11.5–15.5)
WBC: 11.9 10*3/uL — AB (ref 4.0–10.5)

## 2014-10-23 LAB — HEPATIC FUNCTION PANEL
ALK PHOS: 54 U/L (ref 39–117)
ALT: 17 U/L (ref 0–35)
AST: 24 U/L (ref 0–37)
Albumin: 3.8 g/dL (ref 3.5–5.2)
Bilirubin, Direct: 0.1 mg/dL (ref 0.0–0.3)
TOTAL PROTEIN: 6.9 g/dL (ref 6.0–8.3)
Total Bilirubin: 0.9 mg/dL (ref 0.2–1.2)

## 2014-10-23 LAB — BASIC METABOLIC PANEL
BUN: 20 mg/dL (ref 6–23)
CO2: 30 mEq/L (ref 19–32)
CREATININE: 0.8 mg/dL (ref 0.4–1.2)
Calcium: 9.1 mg/dL (ref 8.4–10.5)
Chloride: 103 mEq/L (ref 96–112)
GFR: 77 mL/min (ref >60.00)
Glucose, Bld: 83 mg/dL (ref 70–99)
Potassium: 4.4 mEq/L (ref 3.5–5.1)
SODIUM: 137 meq/L (ref 135–145)

## 2014-10-23 LAB — VITAMIN D 25 HYDROXY (VIT D DEFICIENCY, FRACTURES): VITD: 31.45 ng/mL (ref 30.00–100.00)

## 2014-10-23 LAB — TSH: TSH: 2.25 u[IU]/mL (ref 0.35–4.50)

## 2014-10-24 ENCOUNTER — Other Ambulatory Visit: Payer: Self-pay | Admitting: Obstetrics and Gynecology

## 2014-10-25 LAB — NMR LIPOPROFILE WITH LIPIDS
Cholesterol, Total: 224 mg/dL — ABNORMAL HIGH (ref 100–199)
HDL PARTICLE NUMBER: 34.8 umol/L (ref >=30.5)
HDL Size: 9.1 nm — ABNORMAL LOW (ref >=9.2)
HDL-C: 64 mg/dL (ref >39)
LARGE HDL: 8.1 umol/L (ref >=4.8)
LDL (calc): 128 mg/dL — ABNORMAL HIGH (ref 0–99)
LDL Particle Number: 1566 nmol/L — ABNORMAL HIGH (ref <1000)
LDL Size: 21 nm (ref >=20.8)
LP-IR SCORE: 36 (ref <=45)
Large VLDL-P: 3.3 nmol/L — ABNORMAL HIGH (ref <=2.7)
SMALL LDL PARTICLE NUMBER: 570 nmol/L — AB (ref <=527)
Triglycerides: 161 mg/dL — ABNORMAL HIGH (ref 0–149)
VLDL Size: 42.5 nm (ref <=46.6)

## 2014-10-25 LAB — CYTOLOGY - PAP

## 2014-11-07 ENCOUNTER — Ambulatory Visit
Admission: RE | Admit: 2014-11-07 | Discharge: 2014-11-07 | Disposition: A | Discharge status: Home / Self Care | Source: Ambulatory Visit | Attending: Internal Medicine | Admitting: Internal Medicine

## 2014-11-07 DIAGNOSIS — K862 Cyst of pancreas: Secondary | ICD-10-CM

## 2014-11-07 MED ORDER — GADOBENATE DIMEGLUMINE 529 MG/ML IV SOLN
13.0000 mL | Freq: Once | INTRAVENOUS | Status: AC | PRN
  Administered 2014-11-07: 13 mL via INTRAVENOUS

## 2014-11-23 ENCOUNTER — Other Ambulatory Visit: Payer: Self-pay | Admitting: Internal Medicine

## 2014-12-19 ENCOUNTER — Other Ambulatory Visit: Payer: Self-pay | Admitting: Internal Medicine

## 2014-12-19 NOTE — Telephone Encounter (Signed)
OK X 3 mos 

## 2015-01-08 ENCOUNTER — Other Ambulatory Visit: Payer: Self-pay | Admitting: Dermatology

## 2015-01-10 ENCOUNTER — Other Ambulatory Visit: Payer: Self-pay | Admitting: Internal Medicine

## 2015-01-17 ENCOUNTER — Other Ambulatory Visit: Payer: Self-pay | Admitting: Internal Medicine

## 2015-01-17 NOTE — Telephone Encounter (Signed)
OK X1 

## 2015-01-17 NOTE — Telephone Encounter (Signed)
Lorazepam has been called to [facility] aid on [address]

## 2015-02-01 ENCOUNTER — Other Ambulatory Visit: Payer: Self-pay | Admitting: Internal Medicine

## 2015-03-09 ENCOUNTER — Ambulatory Visit (INDEPENDENT_AMBULATORY_CARE_PROVIDER_SITE_OTHER): Admitting: Family Medicine

## 2015-03-09 VITALS — BP 116/72 | HR 88 | Temp 98.2°F | Ht 65.5 in | Wt 141.2 lb

## 2015-03-09 DIAGNOSIS — R05 Cough: Secondary | ICD-10-CM | POA: Diagnosis not present

## 2015-03-09 DIAGNOSIS — J45998 Other asthma: Secondary | ICD-10-CM | POA: Diagnosis not present

## 2015-03-09 DIAGNOSIS — R059 Cough, unspecified: Secondary | ICD-10-CM

## 2015-03-09 DIAGNOSIS — R062 Wheezing: Secondary | ICD-10-CM

## 2015-03-09 LAB — POCT CBC
GRANULOCYTE PERCENT: 72.5 % (ref 37–80)
HCT, POC: 45.2 % (ref 37.7–47.9)
Hemoglobin: 14.8 g/dL (ref 12.2–16.2)
Lymph, poc: 1.6 (ref 0.6–3.4)
MCH, POC: 29.1 pg (ref 27–31.2)
MCHC: 32.8 g/dL (ref 31.8–35.4)
MCV: 88.6 fL (ref 80–97)
MID (CBC): 0.5 (ref 0–0.9)
MPV: 7.2 fL (ref 0–99.8)
POC Granulocyte: 5.5 (ref 2–6.9)
POC LYMPH %: 21.1 % (ref 10–50)
POC MID %: 6.4 %M (ref 0–12)
Platelet Count, POC: 354 10*3/uL (ref 142–424)
RBC: 5.1 M/uL (ref 4.04–5.48)
RDW, POC: 13.8 %
WBC: 7.6 10*3/uL (ref 4.6–10.2)

## 2015-03-09 MED ORDER — PREDNISONE 20 MG PO TABS: ORAL_TABLET | ORAL | Status: DC

## 2015-03-09 MED ORDER — AZITHROMYCIN 250 MG PO TABS: ORAL_TABLET | ORAL | Status: DC

## 2015-03-09 NOTE — Progress Notes (Signed)
Subjective: 64 year old lady with a history of allergic/infective asthma over the years. She one point had to be on a nine-month course of steroids for it. She sees Dr. Lenis Dickinson young, pulmonologist, who treats her with Advair and albuterol. She has gotten worse over the last week with increased coughing and shortness of breath. She wheezes more when she lays down at night. She does not smoke. She works a Designer, [employer]. Last week she was on the airplane across the country. She just generally feels worse. She does have a history of pneumonias in the past.  Objective: Pleasant alert healthy appearing lady. TMs are partially occluded by cerumen bilaterally. Throat was clear. Neck supple without significant nodes. Chest has scattered rhonchi and wheezes throughout both lung fields. No defined rales. Heart regular without murmurs.  Assessment: Cough Wheezing History of allergic/infective asthma  Plan: Our x-ray machine was down and explained that if she got worse she would need to have further studies done. Check a CBC on her. We'll probably treat with steroids and antibiotics.  Results for orders placed or performed in visit on 03/09/15  POCT CBC  Result Value Ref Range   WBC 7.6 4.6 - 10.2 K/uL   Lymph, poc 1.6 0.6 - 3.4   POC LYMPH PERCENT 21.1 10 - 50 %L   MID (cbc) 0.5 0 - 0.9   POC MID % 6.4 0 - 12 %M   POC Granulocyte 5.5 2 - 6.9   Granulocyte percent 72.5 37 - 80 %G   RBC 5.10 4.04 - 5.48 M/uL   Hemoglobin 14.8 12.2 - 16.2 g/dL   HCT, POC 45.2 37.7 - 47.9 %   MCV 88.6 80 - 97 fL   MCH, POC 29.1 27 - 31.2 pg   MCHC 32.8 31.8 - 35.4 g/dL   RDW, POC 13.8 %   Platelet Count, POC 354 142 - 424 K/uL   MPV 7.2 0 - 99.8 fL    Will treat with steroids and azithromycin. Return if worse.

## 2015-03-09 NOTE — Patient Instructions (Signed)
Take the prednisone 3 pills daily for 2 days, then 2 daily for 2 days, then 1 daily for 2 days  Continue using her inhaler  Take the azithromycin 2 pills initially, then 1 daily for 4 days  Drink plenty of fluids to keep yourself well hydrated  If you're getting worse at any time return or go to the emergency room if necessary.  Follow up here or with your pulmonary doctor as needed

## 2015-03-10 ENCOUNTER — Other Ambulatory Visit: Payer: Self-pay | Admitting: Internal Medicine

## 2015-06-06 ENCOUNTER — Ambulatory Visit: Admitting: Internal Medicine

## 2015-07-29 ENCOUNTER — Other Ambulatory Visit: Payer: Self-pay | Admitting: Emergency Medicine

## 2015-07-29 ENCOUNTER — Other Ambulatory Visit: Payer: Self-pay | Admitting: Internal Medicine

## 2015-07-29 MED ORDER — SIMVASTATIN 40 MG PO TABS: 40.0000 mg | ORAL_TABLET | Freq: Every day | ORAL | Status: DC

## 2015-08-05 ENCOUNTER — Telehealth: Payer: Self-pay | Admitting: Internal Medicine

## 2015-08-05 NOTE — Telephone Encounter (Signed)
Called and spoke to pt. Pt c/o cough with clear mucus production, increase in SOB, and weakness x 2 weeks. Pt states she has started taking Doxy (standing rx from [facility]) on 10.14.16 and feels no improvement. Appt made with TP on 10.18.2016. Pt verbalized understanding and denied any further questions or concerns at this time.

## 2015-08-06 ENCOUNTER — Ambulatory Visit (INDEPENDENT_AMBULATORY_CARE_PROVIDER_SITE_OTHER)
Admission: RE | Admit: 2015-08-06 | Discharge: 2015-08-06 | Disposition: A | Discharge status: Home / Self Care | Source: Ambulatory Visit | Attending: Adult Health | Admitting: Adult Health

## 2015-08-06 ENCOUNTER — Ambulatory Visit (INDEPENDENT_AMBULATORY_CARE_PROVIDER_SITE_OTHER): Admitting: Adult Health

## 2015-08-06 ENCOUNTER — Encounter: Payer: Self-pay | Admitting: Adult Health

## 2015-08-06 VITALS — BP 120/72 | HR 75 | Temp 98.1°F | Ht 65.5 in | Wt 140.4 lb

## 2015-08-06 DIAGNOSIS — J45998 Other asthma: Secondary | ICD-10-CM | POA: Diagnosis not present

## 2015-08-06 DIAGNOSIS — J40 Bronchitis, not specified as acute or chronic: Secondary | ICD-10-CM

## 2015-08-06 DIAGNOSIS — Z8701 Personal history of pneumonia (recurrent): Secondary | ICD-10-CM

## 2015-08-06 MED ORDER — LEVALBUTEROL HCL 0.63 MG/3ML IN NEBU
0.6300 mg | INHALATION_SOLUTION | Freq: Once | RESPIRATORY_TRACT | Status: AC
  Administered 2015-08-06: 0.63 mg via RESPIRATORY_TRACT

## 2015-08-06 MED ORDER — PREDNISONE 10 MG PO TABS: ORAL_TABLET | ORAL | Status: DC

## 2015-08-06 MED ORDER — LEVOFLOXACIN 750 MG PO TABS: 750.0000 mg | ORAL_TABLET | Freq: Every day | ORAL | Status: AC

## 2015-08-06 NOTE — Addendum Note (Signed)
Addended by: Melvenia Needles on: 08/06/2015 03:53 PM   Modules accepted: Orders

## 2015-08-06 NOTE — Assessment & Plan Note (Signed)
Late add : CXR return with PNA  CAP with LLL opacities  Will begin Levaquin 750mg  daily for 5 days  follow up Dr. Annamaria Boots  In 2-3 weeks with cxr  Please contact office for sooner follow up if symptoms do not improve or worsen or seek emergency care

## 2015-08-06 NOTE — Assessment & Plan Note (Addendum)
Flare with URI /bronchitis  Check cxr  xopenex neb x 1   Plan  Finish Doxycycline as planned.  Mucinex DM Twice daily  As needed  Cough/congestion  Prednisone taper over next week.  Fluids and rest .  Chest xray today .  follow up Dr. Annamaria Boots  As planned and As needed   Please contact office for sooner follow up if symptoms do not improve or worsen or seek emergency care

## 2015-08-06 NOTE — Patient Instructions (Signed)
Finish Doxycycline as planned.  Mucinex DM Twice daily  As needed  Cough/congestion  Prednisone taper over next week.  Fluids and rest .  Chest xray today .  follow up Dr. Annamaria Boots  As planned and As needed   Please contact office for sooner follow up if symptoms do not improve or worsen or seek emergency care

## 2015-08-06 NOTE — Progress Notes (Signed)
   Subjective:    Patient ID: Debbie Hogan, female    DOB: 1951/09/25, 64 y.o.   MRN: [id number]  HPI 64 year old female never smoker with history of asthma, hx of DVT  and ABPA , and nasal polyps   08/06/2015 Acute OV  Patient presents for an acute office visit She complains of productive cough, congestion, wheezing and chest tightness. She's been having intermittent wheezing. She was recently on vacation visiting family. Her son and grandchild were both sick with upper respiratory infections. She denies any hemoptysis, chest pain, orthopnea, PND, leg swelling, calf pain or fever She remains on Advair twice daily. She started on Doxycycline 4 days ago.     Review of Systems  Constitutional:   No  weight loss, night sweats,  Fevers, chills, fatigue, or  lassitude.  HEENT:   No headaches,  Difficulty swallowing,  Tooth/dental problems, or  Sore throat,                No sneezing, itching, ear ache, nasal congestion, post nasal drip,   CV:  No chest pain,  Orthopnea, PND, swelling in lower extremities, anasarca, dizziness, palpitations, syncope.   GI  No heartburn, indigestion, abdominal pain, nausea, vomiting, diarrhea, change in bowel habits, loss of appetite, bloody stools.   Resp:   No chest wall deformity  Skin: no rash or lesions.  GU: no dysuria, change in color of urine, no urgency or frequency.  No flank pain, no hematuria   MS:  No joint pain or swelling.  No decreased range of motion.  No back pain.  Psych:  No change in mood or affect. No depression or anxiety.  No memory loss.        Objective:   Physical Exam GEN: A/Ox3; pleasant , NAD, well nourished   HEENT:  [place]/AT,  EACs-clear, TMs-wnl, NOSE-clear, THROAT-clear, no lesions, no postnasal drip or exudate noted.   NECK:  Supple w/ fair ROM; no JVD; normal carotid impulses w/o bruits; no thyromegaly or nodules palpated; no lymphadenopathy.  RESP  Few trace wheezes no accessory muscle use, no dullness to  percussion  CARD:  RRR, no m/r/g  , no peripheral edema, pulses intact, no cyanosis or clubbing.  GI:   Soft & nt; nml bowel sounds; no organomegaly or masses detected.  Musco: Warm bil, no deformities or joint swelling noted.   Neuro: alert, no focal deficits noted.    Skin: Warm, no lesions or rashes    CXR reviewd independently  08/06/2015  Left lower lobe patchy airspace disease worrisome for pneumonia.       Assessment & Plan:

## 2015-08-12 NOTE — Progress Notes (Signed)
Quick Note:  Pt returned call on 08/06/15 and discussed results and recs with TP. Nothing further needed ______

## 2015-09-19 ENCOUNTER — Ambulatory Visit (INDEPENDENT_AMBULATORY_CARE_PROVIDER_SITE_OTHER)
Admission: RE | Admit: 2015-09-19 | Discharge: 2015-09-19 | Disposition: A | Discharge status: Home / Self Care | Source: Ambulatory Visit | Attending: Internal Medicine | Admitting: Internal Medicine

## 2015-09-19 ENCOUNTER — Ambulatory Visit (INDEPENDENT_AMBULATORY_CARE_PROVIDER_SITE_OTHER): Admitting: Internal Medicine

## 2015-09-19 ENCOUNTER — Encounter: Payer: Self-pay | Admitting: Internal Medicine

## 2015-09-19 VITALS — BP 138/80 | HR 80 | Ht 65.5 in | Wt 145.4 lb

## 2015-09-19 DIAGNOSIS — J45998 Other asthma: Secondary | ICD-10-CM | POA: Diagnosis not present

## 2015-09-19 DIAGNOSIS — I82409 Acute embolism and thrombosis of unspecified deep veins of unspecified lower extremity: Secondary | ICD-10-CM

## 2015-09-19 DIAGNOSIS — Z23 Encounter for immunization: Secondary | ICD-10-CM

## 2015-09-19 DIAGNOSIS — B4481 Allergic bronchopulmonary aspergillosis: Secondary | ICD-10-CM

## 2015-09-19 DIAGNOSIS — J4520 Mild intermittent asthma, uncomplicated: Secondary | ICD-10-CM

## 2015-09-19 DIAGNOSIS — D721 Eosinophilia, unspecified: Secondary | ICD-10-CM

## 2015-09-19 MED ORDER — HYDROCODONE-HOMATROPINE 5-1.5 MG/5ML PO SYRP: 5.0000 mL | ORAL_SOLUTION | Freq: Four times a day (QID) | ORAL | Status: DC | PRN

## 2015-09-19 MED ORDER — LORAZEPAM 0.5 MG PO TABS: ORAL_TABLET | ORAL | Status: DC

## 2015-09-19 MED ORDER — DOXYCYCLINE HYCLATE 100 MG PO TABS: ORAL_TABLET | ORAL | Status: DC

## 2015-09-19 NOTE — Progress Notes (Signed)
Subjective:    Patient ID: Debbie Hogan, female    DOB: 04-Aug-1951, 64 y.o.   MRN: [id number]  HPI 03/06/11- 5 yoF never smoker, followed for asthma, hx DVT, HBP, hx APBA with abnormal cxr. Originally met years ago, presenting with lung nodules of retained mucus- never proved ABPA. Last here- January 28, 2010 after a pneumonia 2 months prior. EOS were 11.6%, down from 28 %.  Currently she says she feels "great" and feels well today. She had used her standby doxy script in the Fall, stopping a sinus infection early.  She comments she has had no sense of smell in years. When on prednisone regularly she got it back for awhile. Flonase wasn't sufficient for this.. Occasional bolus of mucus felt as  postnasal drip. Did not improve after nasal polypectomy. Advair 100 didn't quite hold her, but Advair 250 once daily has done well.   03/08/12- 16 yoF never smoker, followed for asthma, hx DVT, HBP, hx APBA with abnormal cxr, nasal polyps. Originally met years ago, presenting with lung nodules of retained mucus- never proved ABPA. We reviewed old CXR reports from first in 2004 to complete clearing. Some tightness if skips her once daily Advair 250- interested in trying Advair 100. Denies nasal congestion as she continues Flonase w/o epistaxis. Hx nasal polypectomy, no aspirin allergy. No recurrence of DVT  05/02/13- 61 yoF never smoker, followed for asthma, hx DVT, HBP, hx APBA with abnormal cxr, nasal polyps. Originally met years ago, presenting with lung nodules of retained mucus- never proved ABPA Was doing really well until a few days ago.  c/o green mucus from sinuses, sinus pressure, HA, PND, hoarseness, and dry cough yesterday.  Urgent Care Rx'd doxy-got well, then flew, now relapse. Frontal HA, green w/ neti pot, hack cough butr no wheeze or fever.  CXR 08/09/12 IMPRESSION:  Stable examination. No acute cardiopulmonary process.  Original Report Authenticated By: Vivia Ewing, M.D.  06/05/14- 67  yoF never smoker, followed for asthma, hx DVT, HBP, hx APBA with abnormal cxr, nasal polyps. Originally met years ago, presenting with lung nodules of retained mucus- never proved ABPA FOLLOWS FOR: has had an overall good year until 1 month ago-flare up of congestion-currently on Doxycycline and getting better  08/06/2015 Acute OV -NP 64 year old female never smoker with history of asthma, hx of DVT and ABPA , and nasal polyps   Patient presents for an acute office visit She complains of productive cough, congestion, wheezing and chest tightness. She's been having intermittent wheezing. She was recently on vacation visiting family. Her son and grandchild were both sick with upper respiratory infections. She denies any hemoptysis, chest pain, orthopnea, PND, leg swelling, calf pain or fever She remains on Advair twice daily. She started on Doxycycline 4 days ago. P-Flare with URI /bronchitis  Check cxr  xopenex neb x 1  Doxycycline was replaced with prescription for Levaquin on 08/06/2015 after review of chest x-ray suggesting left lower lobe pneumonia Mucinex DM Twice daily As needed Cough/congestion  Prednisone taper over next week.  Fluids and rest .  Chest xray today .  follow up Dr. Annamaria Boots As planned and As needed  Please contact office for sooner follow up if symptoms do not improve or worsen or seek emergency care  CXR 08/06/2015 IMPRESSION: Left lower lobe patchy airspace disease worrisome for pneumonia. Followup PA and lateral chest X-ray is recommended in 3-4 weeks following trial of antibiotic therapy to ensure resolution and exclude underlying malignancy. Electronically Signed  By:  Marybelle Killings M.D.  On: 08/06/2015 14:08  09/19/2015-64 yoF never smoker, followed for asthma, hx DVT, HBP, hx APBA with abnormal cxr, nasal polyps. Originally met years ago, presenting with lung nodules of retained mucus- never proved ABPA FOLLOW FOR: Asthma is doing well, follow up  for pneumonia; Pt says that PCP (Dr. Linna Darner) is leaving, wants to know who Dr. Annamaria Boots recommends for PCP. Feeling very well now after pneumonia 6 weeks ago. Rare need for rescue inhaler.  ROS-see HPI Constitutional:   No-   weight loss, night sweats, fevers, chills, fatigue, lassitude. HEENT:   + headaches, difficulty swallowing, tooth/dental problems, sore throat,       No-  sneezing, itching, ear ache, +nasal congestion, post nasal drip,  CV:  No-   chest pain, orthopnea, PND, swelling in lower extremities, anasarca,   dizziness, palpitations Resp: No-   shortness of breath with exertion or at rest.              No-   productive cough,  no- non-productive cough,  No- coughing up of blood.         No- change in color of mucus.  No- wheezing.   Skin: No-   rash or lesions. GI:  No-   heartburn, indigestion, abdominal pain, nausea, vomiting,  GU:  MS:  No-   joint pain or swelling.   Neuro-     nothing unusual Psych:  No- change in mood or affect. No depression or anxiety.  No memory loss.  OBJ- Physical Exam General- Alert, Oriented, Affect-appropriate, Distress- none acute, WDWN Skin- rash-none, lesions- none, excoriation- none Lymphadenopathy- none Head- atraumatic            Eyes- Gross vision intact, PERRLA, conjunctivae and secretions clear            Ears- Hearing, canals-normal            Nose-, no-Septal dev, mucus, erosion, perforation             Throat- Mallampati II , mucosa clear , drainage- none, tonsils- atrophic Neck- flexible , trachea midline, no stridor , thyroid nl, carotid no bruit Chest - symmetrical excursion , unlabored           Heart/CV- RRR , no murmur , no gallop  , no rub, nl s1 s2                           - JVD- none , edema- none, stasis changes- none, varices- none           Lung- clear to P&A, wheeze- none, cough- none , dullness-none, rub- none           Chest wall-  Abd-  Br/ Gen/ Rectal- Not done, not indicated Extrem- cyanosis- none, clubbing,  none, atrophy- none, strength- nl Neuro- grossly intact to observation

## 2015-09-19 NOTE — Patient Instructions (Addendum)
Scripts printed for med refills as discussed  Prevnar 13 pneumonia vaccine     This is a one-time shot  Please call as needed  Order- CXR   Dx asthma mild intermittent, recent pneumonia

## 2015-09-21 NOTE — Assessment & Plan Note (Signed)
Most recent evaluation 11 months before this visit still showed marked elevation of eosinophil count. 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 Current chest x-ray shows some nodularity along proximal airways which might again represent mucous plugs.

## 2015-09-21 NOTE — Assessment & Plan Note (Addendum)
Okay to refill rescue inhaler to hold and to refill her standby medications as requested. Her eosinophilia invites consideration of Nucala therapy but she really doesn't need it.

## 2015-09-21 NOTE — Assessment & Plan Note (Signed)
No recurrence. Continues measures to avoid venous stasis

## 2015-09-22 NOTE — Assessment & Plan Note (Signed)
Note eosinophilia and watch for characteristic mucus plugging in airways

## 2015-10-01 LAB — HM MAMMOGRAPHY: HM Mammogram: NORMAL (ref 0–4)

## 2015-10-05 ENCOUNTER — Other Ambulatory Visit: Payer: Self-pay | Admitting: Internal Medicine

## 2015-10-07 ENCOUNTER — Other Ambulatory Visit: Payer: Self-pay | Admitting: Emergency Medicine

## 2015-10-07 MED ORDER — LOSARTAN POTASSIUM 100 MG PO TABS
50.0000 mg | ORAL_TABLET | Freq: Every day | ORAL | Status: DC
Start: 2015-10-07 — End: 2016-03-30

## 2015-10-26 ENCOUNTER — Other Ambulatory Visit: Payer: Self-pay | Admitting: Internal Medicine

## 2015-10-27 ENCOUNTER — Other Ambulatory Visit: Payer: Self-pay | Admitting: Internal Medicine

## 2016-02-02 ENCOUNTER — Emergency Department

## 2016-02-02 ENCOUNTER — Emergency Department
Admission: EM | Admit: 2016-02-02 | Discharge: 2016-02-03 | Disposition: A | Discharge status: Home / Self Care | Attending: Emergency Medicine | Admitting: Emergency Medicine

## 2016-02-02 ENCOUNTER — Encounter: Payer: Self-pay | Admitting: Emergency Medicine

## 2016-02-02 DIAGNOSIS — Z87891 Personal history of nicotine dependence: Secondary | ICD-10-CM | POA: Diagnosis not present

## 2016-02-02 DIAGNOSIS — Z7952 Long term (current) use of systemic steroids: Secondary | ICD-10-CM | POA: Insufficient documentation

## 2016-02-02 DIAGNOSIS — Z79899 Other long term (current) drug therapy: Secondary | ICD-10-CM | POA: Insufficient documentation

## 2016-02-02 DIAGNOSIS — R1013 Epigastric pain: Secondary | ICD-10-CM | POA: Insufficient documentation

## 2016-02-02 DIAGNOSIS — I10 Essential (primary) hypertension: Secondary | ICD-10-CM | POA: Insufficient documentation

## 2016-02-02 DIAGNOSIS — I82402 Acute embolism and thrombosis of unspecified deep veins of left lower extremity: Secondary | ICD-10-CM | POA: Insufficient documentation

## 2016-02-02 DIAGNOSIS — Z86010 Personal history of colonic polyps: Secondary | ICD-10-CM | POA: Diagnosis not present

## 2016-02-02 DIAGNOSIS — Z792 Long term (current) use of antibiotics: Secondary | ICD-10-CM | POA: Insufficient documentation

## 2016-02-02 DIAGNOSIS — E785 Hyperlipidemia, unspecified: Secondary | ICD-10-CM | POA: Diagnosis not present

## 2016-02-02 DIAGNOSIS — J45909 Unspecified asthma, uncomplicated: Secondary | ICD-10-CM | POA: Insufficient documentation

## 2016-02-02 DIAGNOSIS — I341 Nonrheumatic mitral (valve) prolapse: Secondary | ICD-10-CM | POA: Diagnosis not present

## 2016-02-02 DIAGNOSIS — R109 Unspecified abdominal pain: Secondary | ICD-10-CM | POA: Diagnosis present

## 2016-02-02 DIAGNOSIS — Z7722 Contact with and (suspected) exposure to environmental tobacco smoke (acute) (chronic): Secondary | ICD-10-CM | POA: Insufficient documentation

## 2016-02-02 DIAGNOSIS — Z7982 Long term (current) use of aspirin: Secondary | ICD-10-CM | POA: Diagnosis not present

## 2016-02-02 LAB — HEPATIC FUNCTION PANEL
ALBUMIN: 4.3 g/dL (ref 3.5–5.0)
ALT: 23 U/L (ref 14–54)
AST: 26 U/L (ref 15–41)
Alkaline Phosphatase: 36 U/L — ABNORMAL LOW (ref 38–126)
Bilirubin, Direct: 0.1 mg/dL (ref 0.1–0.5)
Indirect Bilirubin: 0.7 mg/dL (ref 0.3–0.9)
TOTAL PROTEIN: 6.9 g/dL (ref 6.5–8.1)
Total Bilirubin: 0.8 mg/dL (ref 0.3–1.2)

## 2016-02-02 LAB — BASIC METABOLIC PANEL
Anion gap: 7 (ref 5–15)
BUN: 18 mg/dL (ref 6–20)
CO2: 26 mmol/L (ref 22–32)
CREATININE: 0.55 mg/dL (ref 0.44–1.00)
Calcium: 10.7 mg/dL — ABNORMAL HIGH (ref 8.9–10.3)
Chloride: 103 mmol/L (ref 101–111)
Glucose, Bld: 95 mg/dL (ref 65–99)
POTASSIUM: 3.4 mmol/L — AB (ref 3.5–5.1)
SODIUM: 136 mmol/L (ref 135–145)

## 2016-02-02 LAB — CBC
HCT: 43.6 % (ref 35.0–47.0)
Hemoglobin: 14.5 g/dL (ref 12.0–16.0)
MCH: 30.3 pg (ref 26.0–34.0)
MCHC: 33.3 g/dL (ref 32.0–36.0)
MCV: 91.3 fL (ref 80.0–100.0)
PLATELETS: 329 10*3/uL (ref 150–440)
RBC: 4.78 MIL/uL (ref 3.80–5.20)
RDW: 14.5 % (ref 11.5–14.5)
WBC: 12.8 10*3/uL — AB (ref 3.6–11.0)

## 2016-02-02 LAB — LIPASE, BLOOD: LIPASE: 50 U/L (ref 11–51)

## 2016-02-02 LAB — TROPONIN I: Troponin I: <0.03 ng/mL (ref <0.031)

## 2016-02-02 MED ORDER — GI COCKTAIL ~~LOC~~
30.0000 mL | Freq: Once | ORAL | Status: AC
  Administered 2016-02-02: 30 mL via ORAL
  Filled 2016-02-02: qty 30

## 2016-02-02 NOTE — Discharge Instructions (Signed)
You have 2 findings here that you need to follow up on. The first is that you have a small hemangioma in your liver. This will require a repeat ultrasound in 6 months, please let your doctor know. The second is that you have some brief changes in your ecg. We advise that you follow up with [facility] cardiology tomorrow without fail. They will call you but if you do not hear from them, call them.  Return to the emergency room if you have chest pain, shortness of breath, abdominal pain, vomiting, bleeding or you feel worse in any way.  Abdominal Pain, Adult Many things can cause abdominal pain. Usually, abdominal pain is not caused by a disease and will improve without treatment. It can often be observed and treated at home. Your health care provider will do a physical exam and possibly order blood tests and X-rays to help determine the seriousness of your pain. However, in many cases, more time must pass before a clear cause of the pain can be found. Before that point, your health care provider may not know if you need more testing or further treatment. HOME CARE INSTRUCTIONS Monitor your abdominal pain for any changes. The following actions may help to alleviate any discomfort you are experiencing:  Only take over-the-counter or prescription medicines as directed by your health care provider.  Do not take laxatives unless directed to do so by your health care provider.  Try a clear liquid diet (broth, tea, or water) as directed by your health care provider. Slowly move to a bland diet as tolerated. SEEK MEDICAL CARE IF:  You have unexplained abdominal pain.  You have abdominal pain associated with nausea or diarrhea.  You have pain when you urinate or have a bowel movement.  You experience abdominal pain that wakes you in the night.  You have abdominal pain that is worsened or improved by eating food.  You have abdominal pain that is worsened with eating fatty foods.  You have a fever. SEEK  IMMEDIATE MEDICAL CARE IF:  Your pain does not go away within 2 hours.  You keep throwing up (vomiting).  Your pain is felt only in portions of the abdomen, such as the right side or the left lower portion of the abdomen.  You pass bloody or black tarry stools. MAKE SURE YOU:  Understand these instructions.  Will watch your condition.  Will get help right away if you are not doing well or get worse.   This information is not intended to replace advice given to you by your health care provider. Make sure you discuss any questions you have with your health care provider.   Document Released: 07/15/2005 Document Revised: 06/26/2015 Document Reviewed: 06/14/2013 Elsevier Interactive Patient Education [employer].

## 2016-02-02 NOTE — ED Notes (Signed)
Also states she is scared b/c her husband died of heart attack while driving 5 weeks ago

## 2016-02-02 NOTE — ED Provider Notes (Addendum)
[facility] Emergency Department Provider Note  ____________________________________________   I have reviewed the triage vital signs and the nursing notes.   HISTORY  Chief Complaint Chest Pain and Abdominal Pain    HPI Debbie Hogan is a 65 y.o. female who states that she has a history of abdominal pain with fatty food. She states she ate a host of fatty foods over the last 3 days to celebrate Easter and also drank more wine the normal and then this morning she had epigastric abdominal pain starting at 10:00 which has persisted. She states she has had this exact same pain many times associated with food however doesn't usually last this long. She is not vomited. She does not have any chest pressure she states that at one point the abdominal pain did radiate up a little bit as if it were going towards her esophagus. She's had no diarrhea no melena bright red blood per rectum. The pain is a cramping kind of epigastric pain grips and releases. She states that she has had nothing that makes it better food especially fatty food makes it worse. The pain is been going on on return to basis 10:00 this morning. There is no shortness of breath or chest pain associated with it. The pain is nonpleuritic.      Past Medical History  Diagnosis Date  . Hypertension   . Colonic polyp 2008  . MVP (mitral valve prolapse)     Dr.Brackbill;Cardiology  . Migraine   . 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 Hyperlipemia     see NMR data  . DVT (deep venous thrombosis) ([place])     LLE 2007 (post trauma and prolonged air travel), Lovenox x 6 months  . Kidney stone     X1; spontaneous passgae  . Anxiety     PMH of    Patient Active Problem List   Diagnosis Date Noted  . Nonspecific abnormal results of thyroid function study 08/28/2013  . Pancreatic cyst 02/07/2013  . Other abnormal glucose 02/07/2013  . Kidney stone   . History of nasal  polypectomy 03/08/2012  . 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 Hx: recurrent pneumonia 01/28/2010  . VITAMIN D DEFICIENCY 01/08/2010  . Hyperlipidemia 12/31/2008  . HYPERTENSION 12/31/2008  . History of colonic polyps 12/31/2008  . Acute thromboembolism of deep veins of lower extremity ([place]) 01/09/2008  . 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 ASPERGILLOSIS, BRONCHOPULMONARY, ALLERGIC 06/10/2007    Past Surgical History  Procedure Laterality Date  . Oophorectomy      3  benign cysts  . Nasal sinus surgery  2009    polyps;Dr.Rosen  . Tonsillectomy    . Colonoscopy w/ polypectomy  2009    Dr Earle Gell ,[facility]  . Dilation and curettage of uterus  2014    neg pathology    Current Outpatient Rx  Name  Route  Sig  Dispense  Refill  . ADVAIR DISKUS 250-50 MCG/DOSE AEPB      USE 1 INHALATION TWICE A DAY THEN RINSE MOUTH   180 each   1   . aspirin 325 MG EC tablet   Oral   Take 325 mg by mouth daily.          Marland Kitchen buPROPion (WELLBUTRIN XL) 300 MG 24 hr tablet      TAKE 1 TABLET DAILY   90 tablet   3   . calcium citrate-vitamin D (CITRACAL+D) 315-200 MG-UNIT per tablet   Oral  Take 1 tablet by mouth daily.          Marland Kitchen doxycycline (VIBRA-TABS) 100 MG tablet      2 today then one daiy   8 tablet   5   . estradiol (VIVELLE-DOT) 0.0375 MG/24HR   Transdermal   Place 1 patch onto the skin 2 (two) times a week. Place on Monday and Thursday         . fish oil-omega-3 fatty acids 1000 MG capsule      ON HOLD         . fluticasone (FLONASE) 50 MCG/ACT nasal spray      USE 2 SPRAYS IN EACH NOSTRIL DAILY   48 g   1   . HYDROcodone-homatropine (HYDROMET) 5-1.5 MG/5ML syrup   Oral   Take 5 mLs by mouth every 6 (six) hours as needed for cough.   120 mL   0   . LORazepam (ATIVAN) 0.5 MG tablet      1 every 8 hours if needed   30 tablet   5   . losartan (COZAAR) 100 MG tablet   Oral   Take 0.5 tablets (50 mg total) by mouth daily. --- Please establish with new  PCP for further refills   45 tablet   0   . predniSONE (DELTASONE) 10 MG tablet      4 tabs for 2 days, then 3 tabs for 2 days, 2 tabs for 2 days, then 1 tab for 2 days, then stop   20 tablet   0   . PROAIR HFA 108 (90 Base) MCG/ACT inhaler      USE 2 INHALATIONS INTO THE LUNGS  EVERY 6 HOURS AS NEEDED FOR WHEEZING OR SHORTNESS OF BREATH   25.5 g   1   . progesterone (PROMETRIUM) 200 MG capsule   Oral   Take 1 capsule (200 mg total) by mouth daily. Take for 12 days every 3rd month   30 capsule   0   . simvastatin (ZOCOR) 40 MG tablet      TAKE 1 TABLET AT BEDTIME (WILL NEED OFFICE VISIT WITH NEW PRIMARY CARE PHYSICIAN FOR FURTHER REFILLS)   30 tablet   0     Allergies Kiwi extract; Other; and Montelukast sodium  Family History  Problem Relation Age of Onset  . Dementia Father   . Asthma Father   . Depression Father   . Alcohol abuse Father   . Hypotension Father   . Hypertension Mother     MGM   . Depression Mother   . Hypertension Sister   . Alzheimer's disease Paternal Uncle   . Ovarian cancer Maternal Aunt   . Diabetes Neg Hx   . Stroke Neg Hx   . Depression Sister     X 2  . Alcohol abuse Sister     X 2  . Heart attack Neg Hx     Social History Social History  Substance Use Topics  . Smoking status: Former Smoker    Quit date: 10/19/1980  . Smokeless tobacco: None     Comment: Positive history of passive tobacco smoke exposure until 1990.Smoked [phone], up to 1/2 ppd  . Alcohol Use: Yes     Comment: Social alcohol  4 / week    Review of Systems Constitutional: No fever/chills Eyes: No visual changes. ENT: No sore throat. No stiff neck no neck pain Cardiovascular: Denies chest pain. Respiratory: Denies shortness of breath. Gastrointestinal:   no vomiting.  No  diarrhea.  No constipation. Genitourinary: Negative for dysuria. Musculoskeletal: Negative lower extremity swelling Skin: Negative for rash. Neurological: Negative for headaches,  focal weakness or numbness. 10-point ROS otherwise negative.  ____________________________________________   PHYSICAL EXAM:  VITAL SIGNS: ED Triage Vitals  Enc Vitals Group     BP 02/02/16 1944 197/102 mmHg     Pulse Rate 02/02/16 1944 96     Resp 02/02/16 1944 16     Temp 02/02/16 1944 97.8 F (36.6 C)     Temp Source 02/02/16 1944 Oral     SpO2 02/02/16 1944 97 %     Weight 02/02/16 1942 138 lb (62.596 kg)     Height 02/02/16 1942 5\' 5"  (1.651 m)     Head Cir --      Peak Flow --      Pain Score 02/02/16 2048 8     Pain Loc --      Pain Edu? --      Excl. in [place]? --     Constitutional: Alert and oriented. Well appearing and in no acute distress.Patient very anxious but otherwise in no acute distress Eyes: Conjunctivae are normal. PERRL. EOMI. Head: Atraumatic. Nose: No congestion/rhinnorhea. Mouth/Throat: Mucous membranes are moist.  Oropharynx non-erythematous. Neck: No stridor.   Nontender with no meningismus Cardiovascular: Normal rate, regular rhythm. Grossly normal heart sounds.  Good peripheral circulation. Respiratory: Normal respiratory effort.  No retractions. Lungs CTAB. Abdominal: Soft and tenderness to palpation is noted in the epigastric region which reproduces the patient's pain. No distention. No guarding no rebound Back:  There is no focal tenderness or step off there is no midline tenderness there are no lesions noted. there is no CVA tenderness Musculoskeletal: No lower extremity tenderness. No joint effusions, no DVT signs strong distal pulses no edema Neurologic:  Normal speech and language. No gross focal neurologic deficits are appreciated.  Skin:  Skin is warm, dry and intact. No rash noted. Psychiatric: Mood and affect are anxious. Speech and behavior are normal.  ____________________________________________   LABS (all labs ordered are listed, but only abnormal results are displayed)  Labs Reviewed  BASIC METABOLIC PANEL - Abnormal; Notable  for the following:    Potassium 3.4 (*)    Calcium 10.7 (*)    All other components within normal limits  CBC - Abnormal; Notable for the following:    WBC 12.8 (*)    All other components within normal limits  TROPONIN I  HEPATIC FUNCTION PANEL  LIPASE, BLOOD   ____________________________________________  EKG  I personally interpreted any EKGs ordered by me or triage Sinus tach rate 107 no acute ST elevation, normal axis, there is downsloping EKG changes and the lateral leads consistent with possible ischemia versus strain pattern Repeat EKG, sinus rhythm rate 71 bpm no acute ST or vision acute ST depression normal axis unremarkable EKG persistent T wave other maladies are noted ____________________________________________  RADIOLOGY  I reviewed any imaging ordered by me or triage that were performed during my shift and, if possible, patient and/or family made aware of any abnormal findings. ____________________________________________   PROCEDURES  Procedure(s) performed: None  Critical Care performed: None  ____________________________________________   INITIAL IMPRESSION / ASSESSMENT AND PLAN / ED COURSE  Pertinent labs & imaging results that were available during my care of the patient were reviewed by me and considered in my medical decision making (see chart for details).  Patient with reproducible epigastric pain nonstop since this morning at 10:00 after eating a host  of fatty foods. This is a recurrent problem with her when she eats this kind of food. Her blood pressures up and she is very anxious and upset because her husband died of a heart attack 1 month ago and she is worried that she might have one as well. However, she states that this is exact same pain she does usually get with fatty food. She does still have her gallbladder. She has not had any significant color workup for this discomfort in the past. We'll give her pain medications, her troponin is negative  despite uninterrupted pain since 10:00, this is a very good finding I think. The reproducibility of the pain and the fact that is associated with greasy food is also reassuring. We however we will send her for ultrasound of her gallbladder we'll obtain a lipase and hepatic function tests, her blood pressures elevated but again she is anxious and upset it is my hope that that'll come down, if intervention is required we will do so.  ----------------------------------------- 11:03 PM on 02/02/2016 -----------------------------------------  Patient's blood pressures in the 150s to 160s of this time and she is much more relaxed. Her repeat EKG is reassuring. Patient states that all of her pain went away with a GI cocktail. Ultrasound is reassuring she was made aware of the hemangioma and the need for follow-up. Patient has no complaints at this time and is eager to leave that she is willing to stay for repeat troponin.  ----------------------------------------- 11:39 PM on 02/02/2016 -----------------------------------------  Pt is a [facility] patient. I talked to Dr. Esmond Plants, cardiology from [facility], he and I discussed the patient's initial EKG, her troponin, her history, her findings on physical exam, and her results from this department including vital signs. Patient states that he feels the patient is safe for discharge at this time. She has had no discomfort since a GI cocktail and her symptoms lasted from 10:00 until late in the evening with no evidence of ischemic troponin leak. There was some questionable strain pattern on the EKG initially, what her blood pressure was elevated. Her blood pressure was quite elevated in part because she was self describedly very anxious. The patient states that she feels much better at this time. She is aware of the findings and the need for follow-up on the image exam on her liver and Dr. Candis Musa will call her tomorrow for very rapid outpatient follow-up. She has no  complaints her symptoms were GI pain after fatty food and it was reproducible. At this time, there does not appear to be clinical evidence to support the diagnosis of pulmonary embolus, dissection, myocarditis, endocarditis, pericarditis, pericardial tamponade, acute coronary syndrome, pneumothorax, pneumonia, or any other acute intrathoracic pathology that will require admission or acute intervention. Nor is there evidence of any significant intra-abdominal pathology causing this discomfort. Nonetheless, extensive return precautions and follow-up have been given. The patient I discussed admission but she would very much prefer to go home and declines any thought of being admitted at this time. I do not think this is unreasonable given a cardiology follow-up that we have been able to arrange ____________________________________________   FINAL CLINICAL IMPRESSION(S) / ED DIAGNOSES  Final diagnoses:  Abdominal pain      This chart was dictated using voice recognition software.  Despite best efforts to proofread,  errors can occur which can change meaning.     Schuyler Amor, MD 02/02/16 2055  Schuyler Amor, MD 02/02/16 [place], MD 02/02/16 [phone]

## 2016-02-02 NOTE — ED Notes (Signed)
Pt alert and oriented X4, active, cooperative, pt in NAD. RR even and unlabored, color WNL.  Pt informed to return if any life threatening symptoms occur.   

## 2016-02-02 NOTE — ED Notes (Signed)
Pt states abd pain this am, chest pressure x 1.5 hrs.

## 2016-02-05 NOTE — Progress Notes (Signed)
No show

## 2016-02-07 ENCOUNTER — Encounter: Admitting: Cardiovascular Disease

## 2016-03-03 NOTE — Progress Notes (Signed)
Patient ID: Debbie Hogan, female   DOB: July 26, 1951, 65 y.o.   MRN: [id number]     Cardiology Office Note   Date:  03/09/2016   ID:  Debbie Hogan, DOB 1950/11/14, MRN [id number]  PCP:  No PCP Per Patient  Cardiologist:   Jenkins Rouge, MD   Chief Complaint  Patient presents with  . Establish Care    follow up from [facility] visit, no sx, per pt      History of Present Illness: Debbie Hogan is a 65 y.o. female who presents for chest pain.  Seen in ER at [facility] 4/16 with abdominal pain that radiated up toward her esophagus. Similar pains many times after eating greasy food but didn't last as long. Epigastric pain reproducible on palpation. R/O ECG initially tachycardic rate 107 with lateral T wave changes in face of HTN and some anxiety. Troponin negative x 2.  [place] of gallbladder was normal Lipase and LFT;s were normal CRF;s HTN.  Previously seen by Dr Mare Ferrari for MVP   During event she was very stressed Coming home from [place] with sister her significant other and 51 yo mother. She got pan[place]  [place].  Still mourning loss of her husband a month ago. Died suddenly of MI at home and was DOA     Past Medical History  Diagnosis Date  . Hypertension   . Colonic polyp 2008  . MVP (mitral valve prolapse)     Dr.Brackbill;Cardiology  . Migraine   . 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 Hyperlipemia     see NMR data  . DVT (deep venous thrombosis) ([place])     LLE 2007 (post trauma and prolonged air travel), Lovenox x 6 months  . Kidney stone     X1; spontaneous passgae  . Anxiety     PMH of    Past Surgical History  Procedure Laterality Date  . Oophorectomy      3  benign cysts  . Nasal sinus surgery  2009    polyps;Dr.Rosen  . Tonsillectomy    . Colonoscopy w/ polypectomy  2009    Dr Earle Gell ,[facility]  . Dilation and curettage of uterus  2014    neg pathology     Current Outpatient Prescriptions  Medication Sig  Dispense Refill  . ADVAIR DISKUS 250-50 MCG/DOSE AEPB USE 1 INHALATION TWICE A DAY THEN RINSE MOUTH 180 each 1  . aspirin 81 MG tablet Take 81 mg by mouth daily.    Marland Kitchen buPROPion (WELLBUTRIN XL) 300 MG 24 hr tablet TAKE 1 TABLET DAILY 90 tablet 3  . calcium citrate-vitamin D (CITRACAL+D) 315-200 MG-UNIT per tablet Take 1 tablet by mouth daily.     Marland Kitchen doxycycline (VIBRA-TABS) 100 MG tablet 2 today then one daiy 8 tablet 5  . estradiol (VIVELLE-DOT) 0.0375 MG/24HR Place 1 patch onto the skin 2 (two) times a week. Place on Monday and Thursday    . fish oil-omega-3 fatty acids 1000 MG capsule ON HOLD    . fluticasone (FLONASE) 50 MCG/ACT nasal spray USE 2 SPRAYS IN EACH NOSTRIL DAILY 48 g 1  . HYDROcodone-homatropine (HYDROMET) 5-1.5 MG/5ML syrup Take 5 mLs by mouth every 6 (six) hours as needed for cough. 120 mL 0  . LORazepam (ATIVAN) 0.5 MG tablet 1 every 8 hours if needed 30 tablet 5  . losartan (COZAAR) 100 MG tablet Take 0.5 tablets (50 mg total) by mouth daily. --- Please establish with new PCP  for further refills 45 tablet 0  . PROAIR HFA 108 (90 Base) MCG/ACT inhaler USE 2 INHALATIONS INTO THE LUNGS  EVERY 6 HOURS AS NEEDED FOR WHEEZING OR SHORTNESS OF BREATH 25.5 g 1  . progesterone (PROMETRIUM) 200 MG capsule Take 1 capsule (200 mg total) by mouth daily. Take for 12 days every 3rd month 30 capsule 0  . simvastatin (ZOCOR) 40 MG tablet TAKE 1 TABLET AT BEDTIME (WILL NEED OFFICE VISIT WITH NEW PRIMARY CARE PHYSICIAN FOR FURTHER REFILLS) 30 tablet 0  . zolpidem (AMBIEN) 10 MG tablet Take 10 mg by mouth daily.  0   No current facility-administered medications for this visit.    Allergies:   Kiwi extract; Other; and Montelukast sodium    Social History:  The patient  reports that she quit smoking about 35 years ago. She does not have any smokeless tobacco history on file. She reports that she drinks alcohol. She reports that she does not use illicit drugs.   Family History:  The patient's  family history includes Alcohol abuse in her father and sister; Alzheimer's disease in her paternal uncle; Asthma in her father; Dementia in her father; Depression in her father, mother, and sister; Hypertension in her mother and sister; Hypotension in her father; Ovarian cancer in her maternal aunt. There is no history of Diabetes, Stroke, or Heart attack.    ROS:  Please see the history of present illness.   Otherwise, review of systems are positive for none.   All other systems are reviewed and negative.    PHYSICAL EXAM: VS:  BP 140/90 mmHg  Pulse 67  Ht 5' 5.5" (1.664 m)  Wt 63.231 kg (139 lb 6.4 oz)  BMI 22.84 kg/m2 , BMI Body mass index is 22.84 kg/(m^2). Affect appropriate Healthy:  appears stated age 66: normal Neck supple with no adenopathy JVP normal no bruits no thyromegaly Lungs clear with no wheezing and good diaphragmatic motion Heart:  S1/S2 no murmur, no rub, gallop or click PMI normal Abdomen: benighn, BS positve, no tenderness, no AAA no bruit.  No HSM or HJR Distal pulses intact with no bruits No edema Neuro non-focal Skin warm and dry No muscular weakness    EKG:  4/17  ST rate 107 LAE no acute changes    Recent Labs: 02/02/2016: ALT 23; BUN 18; Creatinine, Ser 0.55; Hemoglobin 14.5; Platelets 329; Potassium 3.4*; Sodium 136    Lipid Panel    Component Value Date/Time   CHOL 224* 10/23/2014 0802   CHOL 181 08/28/2013 0926   TRIG 161* 10/23/2014 0802   TRIG 135.0 08/28/2013 0926   HDL 64 10/23/2014 0802   HDL 59.30 08/28/2013 0926   CHOLHDL 3 08/28/2013 0926   VLDL 27.0 08/28/2013 0926   LDLCALC 128* 10/23/2014 0802   LDLCALC 95 08/28/2013 0926   LDLDIRECT 133.7 01/08/2010 0923      Wt Readings from Last 3 Encounters:  03/09/16 63.231 kg (139 lb 6.4 oz)  02/02/16 62.596 kg (138 lb)  09/19/15 65.953 kg (145 lb 6.4 oz)      Other studies Reviewed: Additional studies/ records that were reviewed today include: [facility] ER notes, CXR, GBUS  labs and ECGls .    ASSESSMENT AND PLAN:  1.  Chest Pain related to panic attack most likely f/u stress echo  2. Abdominal Pain resolved f/u primary  3. MVP by history doubt significant Barlow's pathology can image valve during stress echo  4. HTN: continue ARB will see if she has HTN  response to exercise    Current medicines are reviewed at length with the patient today.  The patient does not have concerns regarding medicines.  The following changes have been made:  no change  Labs/ tests ordered today include: stress echo   Orders Placed This Encounter  Procedures  . Exercise Tolerance Test     Disposition:   FU with me in 6 months if tests normal      Signed, Jenkins Rouge, MD  03/09/2016 5:19 PM    [facility], [place], [place]  [address] Phone: [phone]; Fax: [phone]

## 2016-03-09 ENCOUNTER — Encounter: Payer: Self-pay | Admitting: Cardiovascular Disease

## 2016-03-09 ENCOUNTER — Ambulatory Visit (INDEPENDENT_AMBULATORY_CARE_PROVIDER_SITE_OTHER): Admitting: Cardiovascular Disease

## 2016-03-09 VITALS — BP 140/90 | HR 67 | Ht 65.5 in | Wt 139.4 lb

## 2016-03-09 DIAGNOSIS — I341 Nonrheumatic mitral (valve) prolapse: Secondary | ICD-10-CM

## 2016-03-09 DIAGNOSIS — Z7189 Other specified counseling: Secondary | ICD-10-CM

## 2016-03-09 DIAGNOSIS — Z7689 Persons encountering health services in other specified circumstances: Secondary | ICD-10-CM

## 2016-03-09 NOTE — Patient Instructions (Signed)
Medication Instructions:  Your physician recommends that you continue on your current medications as directed. Please refer to the Current Medication list given to you today.  Labwork: NONE  Testing/Procedures: Your physician has requested that you have an exercise tolerance test. For further information please visit [email or web address]. Please also follow instruction sheet, as given.  Follow-Up: Your physician wants you to follow-up in: 6 months with Dr. Nishan. You will receive a reminder letter in the mail two months in advance. If you don't receive a letter, please call our office to schedule the follow-up appointment.   If you need a refill on your cardiac medications before your next appointment, please call your pharmacy.    

## 2016-03-10 ENCOUNTER — Encounter: Payer: Self-pay | Admitting: Cardiovascular Disease

## 2016-03-12 ENCOUNTER — Other Ambulatory Visit: Payer: Self-pay

## 2016-03-12 DIAGNOSIS — R0789 Other chest pain: Secondary | ICD-10-CM

## 2016-03-12 DIAGNOSIS — I341 Nonrheumatic mitral (valve) prolapse: Secondary | ICD-10-CM

## 2016-03-13 ENCOUNTER — Encounter

## 2016-03-26 ENCOUNTER — Telehealth: Payer: Self-pay | Admitting: Internal Medicine

## 2016-03-26 NOTE — Telephone Encounter (Signed)
Pt called request to transfer from [facility] to Dr. Ronnald Ramp. Dr. Annamaria Boots recommend Dr. Ronnald Ramp. Pt also need refill for losartan (COZAAR) 100 MG tablet to be send express scrip as well. Please advise.

## 2016-03-30 ENCOUNTER — Other Ambulatory Visit: Payer: Self-pay | Admitting: Internal Medicine

## 2016-03-30 NOTE — Telephone Encounter (Signed)
appt scheduled. Pt will contact pharmacy request refill for this med.

## 2016-03-30 NOTE — Telephone Encounter (Signed)
yes

## 2016-04-10 ENCOUNTER — Telehealth (HOSPITAL_COMMUNITY): Payer: Self-pay | Admitting: *Deleted

## 2016-04-10 NOTE — Telephone Encounter (Signed)
Patient given detailed instructions per Stress Test Requisition Sheet for test on 04/13/16 at 1:30.Patient Notified to arrive 30 minutes early, and that it is imperative to arrive on time for appointment to keep from having the test rescheduled.  Patient verbalized understanding. Debbie Hogan

## 2016-04-13 ENCOUNTER — Ambulatory Visit (HOSPITAL_BASED_OUTPATIENT_CLINIC_OR_DEPARTMENT_OTHER)

## 2016-04-13 ENCOUNTER — Ambulatory Visit (HOSPITAL_COMMUNITY): Attending: Internal Medicine

## 2016-04-13 DIAGNOSIS — R000 Tachycardia, unspecified: Secondary | ICD-10-CM | POA: Diagnosis not present

## 2016-04-13 DIAGNOSIS — R0789 Other chest pain: Secondary | ICD-10-CM | POA: Diagnosis not present

## 2016-04-13 DIAGNOSIS — I10 Essential (primary) hypertension: Secondary | ICD-10-CM | POA: Diagnosis not present

## 2016-04-13 DIAGNOSIS — I341 Nonrheumatic mitral (valve) prolapse: Secondary | ICD-10-CM | POA: Diagnosis not present

## 2016-04-13 DIAGNOSIS — R0989 Other specified symptoms and signs involving the circulatory and respiratory systems: Secondary | ICD-10-CM

## 2016-04-13 DIAGNOSIS — R079 Chest pain, unspecified: Secondary | ICD-10-CM | POA: Diagnosis present

## 2016-04-14 ENCOUNTER — Telehealth: Payer: Self-pay | Admitting: Cardiovascular Disease

## 2016-04-14 NOTE — Telephone Encounter (Signed)
-----   Message from Josue Hector, MD sent at 04/14/2016  2:59 PM EDT ----- Normal stress echo

## 2016-04-14 NOTE — Telephone Encounter (Signed)
Informed patient of results and verbal understanding expressed.   Patient would like to know if there will be any follow-up since her test was normal. Informed her that per Dr. Kyla Balzarine last OV note, he would see her back in 6 months. She understands she will be called by Dr. Kyla Balzarine nurse if follow-up plans change.

## 2016-04-14 NOTE — Telephone Encounter (Signed)
New message ° ° ° °Pt is calling for results.  °

## 2016-05-01 ENCOUNTER — Encounter: Payer: Self-pay | Admitting: Internal Medicine

## 2016-05-01 ENCOUNTER — Encounter: Admitting: Internal Medicine

## 2016-05-01 ENCOUNTER — Ambulatory Visit (INDEPENDENT_AMBULATORY_CARE_PROVIDER_SITE_OTHER): Admitting: Internal Medicine

## 2016-05-01 ENCOUNTER — Other Ambulatory Visit (INDEPENDENT_AMBULATORY_CARE_PROVIDER_SITE_OTHER)

## 2016-05-01 VITALS — BP 128/82 | HR 82 | Temp 98.4°F | Resp 16 | Ht 65.5 in | Wt 139.1 lb

## 2016-05-01 DIAGNOSIS — E559 Vitamin D deficiency, unspecified: Secondary | ICD-10-CM

## 2016-05-01 DIAGNOSIS — D721 Eosinophilia, unspecified: Secondary | ICD-10-CM

## 2016-05-01 DIAGNOSIS — I10 Essential (primary) hypertension: Secondary | ICD-10-CM | POA: Diagnosis not present

## 2016-05-01 DIAGNOSIS — Z0000 Encounter for general adult medical examination without abnormal findings: Secondary | ICD-10-CM

## 2016-05-01 DIAGNOSIS — E785 Hyperlipidemia, unspecified: Secondary | ICD-10-CM

## 2016-05-01 DIAGNOSIS — Z8709 Personal history of other diseases of the respiratory system: Secondary | ICD-10-CM | POA: Diagnosis not present

## 2016-05-01 DIAGNOSIS — Z23 Encounter for immunization: Secondary | ICD-10-CM

## 2016-05-01 LAB — CBC WITH DIFFERENTIAL/PLATELET
BASOS ABS: 0.1 10*3/uL (ref 0.0–0.1)
BASOS PCT: 1.4 % (ref 0.0–3.0)
EOS ABS: 1.4 10*3/uL — AB (ref 0.0–0.7)
Eosinophils Relative: 13.7 % — ABNORMAL HIGH (ref 0.0–5.0)
HEMATOCRIT: 43.9 % (ref 36.0–46.0)
HEMOGLOBIN: 14.6 g/dL (ref 12.0–15.0)
LYMPHS PCT: 20.9 % (ref 12.0–46.0)
Lymphs Abs: 2.1 10*3/uL (ref 0.7–4.0)
MCHC: 33.3 g/dL (ref 30.0–36.0)
MCV: 90.1 fl (ref 78.0–100.0)
Monocytes Absolute: 0.6 10*3/uL (ref 0.1–1.0)
Monocytes Relative: 5.4 % (ref 3.0–12.0)
Neutro Abs: 6 10*3/uL (ref 1.4–7.7)
Neutrophils Relative %: 58.6 % (ref 43.0–77.0)
Platelets: 420 10*3/uL — ABNORMAL HIGH (ref 150.0–400.0)
RBC: 4.87 Mil/uL (ref 3.87–5.11)
RDW: 13.1 % (ref 11.5–15.5)
WBC: 10.3 10*3/uL (ref 4.0–10.5)

## 2016-05-01 LAB — LIPID PANEL
CHOLESTEROL: 188 mg/dL (ref 0–200)
HDL: 60.7 mg/dL (ref >39.00)
LDL CALC: 103 mg/dL — AB (ref 0–99)
NonHDL: 127.03
TRIGLYCERIDES: 120 mg/dL (ref 0.0–149.0)
Total CHOL/HDL Ratio: 3
VLDL: 24 mg/dL (ref 0.0–40.0)

## 2016-05-01 LAB — COMPREHENSIVE METABOLIC PANEL
ALBUMIN: 4.1 g/dL (ref 3.5–5.2)
ALT: 15 U/L (ref 0–35)
AST: 20 U/L (ref 0–37)
Alkaline Phosphatase: 44 U/L (ref 39–117)
BUN: 21 mg/dL (ref 6–23)
CALCIUM: 9.7 mg/dL (ref 8.4–10.5)
CHLORIDE: 102 meq/L (ref 96–112)
CO2: 30 mEq/L (ref 19–32)
CREATININE: 0.66 mg/dL (ref 0.40–1.20)
GFR: 95.67 mL/min (ref >60.00)
Glucose, Bld: 98 mg/dL (ref 70–99)
Potassium: 4 mEq/L (ref 3.5–5.1)
Sodium: 139 mEq/L (ref 135–145)
Total Bilirubin: 0.4 mg/dL (ref 0.2–1.2)
Total Protein: 6.9 g/dL (ref 6.0–8.3)

## 2016-05-01 LAB — TSH: TSH: 1.22 u[IU]/mL (ref 0.35–4.50)

## 2016-05-01 LAB — VITAMIN D 25 HYDROXY (VIT D DEFICIENCY, FRACTURES): VITD: 55.31 ng/mL (ref 30.00–100.00)

## 2016-05-01 NOTE — Progress Notes (Signed)
Subjective:  Patient ID: Debbie Hogan, female    DOB: 1951-06-18  Age: 65 y.o. MRN: [id number]  CC: Annual Exam; Hypertension; and Hyperlipidemia  NEW TO ME  HPI Lenika Obriant presents for a CPX.  Her husband died suddenly about 4 months ago. Since that time she has been dealing with the grief by occasionally binging on wine. At times she will have 1 glass a day and then some days she has up to 6-7 glasses of wine in a day. Other than that, she feels like a grief is resolving. She denies insomnia/anxiety/anhedonia/changes in her appetite or changes in her weight. She does have intermittent crying spells and feelings of anhedonia. She denies feeling hopeless/helpless/worthless/suicidal or homicidal. She had an episode of atypical chest pain several months ago and was admitted to [facility] and was evaluated by cardiology and found to have no signs of cardiovascular disease.  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   She has past surgical history that includes Oophorectomy; Nasal sinus surgery (2009); Tonsillectomy; Colonoscopy w/ polypectomy (2009); and Dilation and curettage of uterus (2014).   Her family history includes Alcohol abuse in her father, sister, sister, and sister; Alzheimer's disease in her paternal uncle; Asthma in her father; Dementia in her father; Depression in her father, mother, and sister; Hypertension in her mother and sister; Hypotension in her father; Ovarian cancer in her maternal aunt. There is no history of Diabetes, Stroke, Heart attack, Cancer, or Early death.She reports that she quit smoking about 35 years ago. She has never used smokeless tobacco. She reports that she drinks about 3.0 oz of alcohol per week. She reports that she does not use illicit  drugs.  Outpatient Prescriptions Prior to Visit  Medication Sig Dispense Refill  . ADVAIR DISKUS 250-50 MCG/DOSE AEPB USE 1 INHALATION TWICE A DAY THEN RINSE MOUTH 180 each 1  . aspirin 81 MG tablet Take 81 mg by mouth daily.    Marland Kitchen buPROPion (WELLBUTRIN XL) 300 MG 24 hr tablet TAKE 1 TABLET DAILY 45 tablet 0  . calcium citrate-vitamin D (CITRACAL+D) 315-200 MG-UNIT per tablet Take 1 tablet by mouth daily.     Marland Kitchen estradiol (VIVELLE-DOT) 0.0375 MG/24HR Place 1 patch onto the skin 2 (two) times a week. Place on Monday and Thursday    . fish oil-omega-3 fatty acids 1000 MG capsule ON HOLD    . fluticasone (FLONASE) 50 MCG/ACT nasal spray USE 2 SPRAYS IN EACH NOSTRIL DAILY 48 g 1  . losartan (COZAAR) 100 MG tablet TAKE ONE-HALF (1/2) TABLET (50 MG TOTAL) DAILY (PLEASE ESTABLISH WITH NEW PRIMARY CARE PHYSICIAN FOR FURTHER REFILLS) 30 tablet 0  . PROAIR HFA 108 (90 Base) MCG/ACT inhaler USE 2 INHALATIONS INTO THE LUNGS  EVERY 6 HOURS AS NEEDED FOR WHEEZING OR SHORTNESS OF BREATH 25.5 g 1  . progesterone (PROMETRIUM) 200 MG capsule Take 1 capsule (200 mg total) by mouth daily. Take for 12 days every 3rd month 30 capsule 0  . simvastatin (ZOCOR) 40 MG tablet TAKE 1 TABLET AT BEDTIME (WILL NEED OFFICE VISIT WITH NEW PRIMARY CARE PHYSICIAN FOR FURTHER REFILLS) 45 tablet 0  . LORazepam (ATIVAN) 0.5 MG tablet 1 every 8 hours if needed 30 tablet 5  . zolpidem (AMBIEN) 10 MG tablet Take 10 mg by mouth daily.  0  . doxycycline (VIBRA-TABS) 100 MG tablet 2 today  then one daiy (Patient not taking: Reported on 05/01/2016) 8 tablet 5  . HYDROcodone-homatropine (HYDROMET) 5-1.5 MG/5ML syrup Take 5 mLs by mouth every 6 (six) hours as needed for cough. (Patient not taking: Reported on 05/01/2016) 120 mL 0   No facility-administered medications prior to visit.    ROS Review of Systems  Constitutional: Negative.  Negative for fever, chills, appetite change, fatigue and unexpected weight change.  HENT: Negative.   Negative for facial swelling, sore throat and trouble swallowing.   Eyes: Negative.   Respiratory: Positive for cough and wheezing. Negative for choking, chest tightness, shortness of breath and stridor.        She has rare episodes of coughing and wheezing  Cardiovascular: Negative.  Negative for chest pain and leg swelling.  Gastrointestinal: Negative.  Negative for nausea, vomiting, abdominal pain and diarrhea.  Endocrine: Negative.   Genitourinary: Negative.  Negative for difficulty urinating.  Musculoskeletal: Negative.  Negative for myalgias, back pain, arthralgias and neck pain.  Skin: Negative.  Negative for color change and rash.  Allergic/Immunologic: Negative.   Neurological: Negative.  Negative for dizziness.  Hematological: Negative.  Negative for adenopathy. Does not bruise/bleed easily.  Psychiatric/Behavioral: Positive for dysphoric mood. Negative for suicidal ideas, hallucinations, behavioral problems, confusion, sleep disturbance, self-injury, decreased concentration and agitation. The patient is not nervous/anxious and is not hyperactive.     Objective:  BP 128/82 mmHg  Pulse 82  Temp(Src) 98.4 F (36.9 C) (Oral)  Resp 16  Ht 5' 5.5" (1.664 m)  Wt 139 lb 2 oz (63.107 kg)  BMI 22.79 kg/m2  SpO2 96%  Physical Exam  Constitutional: She is oriented to person, place, and time. No distress.  HENT:  Mouth/Throat: Oropharynx is clear and moist. No oropharyngeal exudate.  Eyes: Conjunctivae are normal. Right eye exhibits no discharge. Left eye exhibits no discharge. No scleral icterus.  Neck: Normal range of motion. Neck supple. No JVD present. No tracheal deviation present. No thyromegaly present.  Cardiovascular: Normal rate, regular rhythm, normal heart sounds and intact distal pulses.  Exam reveals no gallop and no friction rub.   No murmur heard. Pulmonary/Chest: Effort normal and breath sounds normal. No stridor. No respiratory distress. She has no wheezes. She has  no rales. She exhibits no tenderness.  Abdominal: Soft. Bowel sounds are normal. She exhibits no distension and no mass. There is no tenderness. There is no rebound and no guarding.  Musculoskeletal: Normal range of motion. She exhibits no edema or tenderness.  Lymphadenopathy:    She has no cervical adenopathy.  Neurological: She is oriented to person, place, and time.  Skin: Skin is warm and dry. No rash noted. She is not diaphoretic. No erythema. No pallor.  Psychiatric: She has a normal mood and affect. Her behavior is normal. Judgment and thought content normal.  Vitals reviewed.   Lab Results  Component Value Date   WBC 10.3 05/01/2016   HGB 14.6 05/01/2016   HCT 43.9 05/01/2016   PLT 420.0* 05/01/2016   GLUCOSE 98 05/01/2016   CHOL 188 05/01/2016   TRIG 120.0 05/01/2016   HDL 60.70 05/01/2016   LDLDIRECT 133.7 01/08/2010   LDLCALC 103* 05/01/2016   ALT 15 05/01/2016   AST 20 05/01/2016   NA 139 05/01/2016   K 4.0 05/01/2016   CL 102 05/01/2016   CREATININE 0.66 05/01/2016   BUN 21 05/01/2016   CO2 30 05/01/2016   TSH 1.22 05/01/2016   HGBA1C 5.5 02/07/2013    Assessment & Plan:  Rolonda was seen today for annual exam, hypertension and hyperlipidemia.  Diagnoses and all orders for this visit:  Essential hypertension- her blood pressure is adequately well controlled on losartan, electrolytes and renal function are stable. -     Comprehensive metabolic panel; Future  Hyperlipidemia with target LDL less than 130- she has achieved her LDL goal is doing well on simvastatin. 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  Vitamin D deficiency- improvement noted -     VITAMIN D 25 Hydroxy (Vit-D Deficiency, Fractures); Future  Need for prophylactic vaccination against Streptococcus pneumoniae (pneumococcus) -     Pneumococcal polysaccharide vaccine 23-valent greater than or equal to 2yo subcutaneous/IM  I have discontinued Ms. Baires HYDROcodone-homatropine, LORazepam, doxycycline, and zolpidem. I am also having her maintain her calcium citrate-vitamin D, fish oil-omega-3 fatty acids, estradiol, progesterone, PROAIR HFA, ADVAIR DISKUS, fluticasone, aspirin, buPROPion, simvastatin, and losartan.  No orders of the defined types were placed in this encounter.     Follow-up: Return if symptoms worsen or fail to improve.  Scarlette Calico, MD

## 2016-05-01 NOTE — Progress Notes (Signed)
Pre visit review using our clinic review tool, if applicable. No additional management support is needed unless otherwise documented below in the visit note. 

## 2016-05-01 NOTE — Patient Instructions (Signed)
Preventive Care for Adults, Female A healthy lifestyle and preventive care can promote health and wellness. Preventive health guidelines for women include the following key practices.  A routine yearly physical is a good way to check with your health care provider about your health and preventive screening. It is a chance to share any concerns and updates on your health and to receive a thorough exam.  Visit your dentist for a routine exam and preventive care every 6 months. Brush your teeth twice a day and floss once a day. Good oral hygiene prevents tooth decay and gum disease.  The frequency of eye exams is based on your age, health, family medical history, use of contact lenses, and other factors. Follow your health care provider's recommendations for frequency of eye exams.  Eat a healthy diet. Foods like vegetables, fruits, whole grains, low-fat dairy products, and lean protein foods contain the nutrients you need without too many calories. Decrease your intake of foods high in solid fats, added sugars, and salt. Eat the right amount of calories for you.Get information about a proper diet from your health care provider, if necessary.  Regular physical exercise is one of the most important things you can do for your health. Most adults should get at least 150 minutes of moderate-intensity exercise (any activity that increases your heart rate and causes you to sweat) each week. In addition, most adults need muscle-strengthening exercises on 2 or more days a week.  Maintain a healthy weight. The body mass index (BMI) is a screening tool to identify possible weight problems. It provides an estimate of body fat based on height and weight. Your health care provider can find your BMI and can help you achieve or maintain a healthy weight.For adults 20 years and older:  A BMI below 18.5 is considered underweight.  A BMI of 18.5 to 24.9 is normal.  A BMI of 25 to 29.9 is considered overweight.  A  BMI of 30 and above is considered obese.  Maintain normal blood lipids and cholesterol levels by exercising and minimizing your intake of saturated fat. Eat a balanced diet with plenty of fruit and vegetables. Blood tests for lipids and cholesterol should begin at age 45 and be repeated every 5 years. If your lipid or cholesterol levels are high, you are over 50, or you are at high risk for heart disease, you may need your cholesterol levels checked more frequently.Ongoing high lipid and cholesterol levels should be treated with medicines if diet and exercise are not working.  If you smoke, find out from your health care provider how to quit. If you do not use tobacco, do not start.  Lung cancer screening is recommended for adults aged 45-80 years who are at high risk for developing lung cancer because of a history of smoking. A yearly low-dose CT scan of the lungs is recommended for people who have at least a 30-pack-year history of smoking and are a current smoker or have quit within the past 15 years. A pack year of smoking is smoking an average of 1 pack of cigarettes a day for 1 year (for example: 1 pack a day for 30 years or 2 packs a day for 15 years). Yearly screening should continue until the smoker has stopped smoking for at least 15 years. Yearly screening should be stopped for people who develop a health problem that would prevent them from having lung cancer treatment.  If you are pregnant, do not drink alcohol. If you are  breastfeeding, be very cautious about drinking alcohol. If you are not pregnant and choose to drink alcohol, do not have more than 1 drink per day. One drink is considered to be 12 ounces (355 mL) of beer, 5 ounces (148 mL) of wine, or 1.5 ounces (44 mL) of liquor.  Avoid use of street drugs. Do not share needles with anyone. Ask for help if you need support or instructions about stopping the use of drugs.  High blood pressure causes heart disease and increases the risk  of stroke. Your blood pressure should be checked at least every 1 to 2 years. Ongoing high blood pressure should be treated with medicines if weight loss and exercise do not work.  If you are 55-79 years old, ask your health care provider if you should take aspirin to prevent strokes.  Diabetes screening is done by taking a blood sample to check your blood glucose level after you have not eaten for a certain period of time (fasting). If you are not overweight and you do not have risk factors for diabetes, you should be screened once every 3 years starting at age 45. If you are overweight or obese and you are 40-70 years of age, you should be screened for diabetes every year as part of your cardiovascular risk assessment.  Breast cancer screening is essential preventive care for women. You should practice "breast self-awareness." This means understanding the normal appearance and feel of your breasts and may include breast self-examination. Any changes detected, no matter how small, should be reported to a health care provider. Women in their 20s and 30s should have a clinical breast exam (CBE) by a health care provider as part of a regular health exam every 1 to 3 years. After age 40, women should have a CBE every year. Starting at age 40, women should consider having a mammogram (breast X-ray test) every year. Women who have a family history of breast cancer should talk to their health care provider about genetic screening. Women at a high risk of breast cancer should talk to their health care providers about having an MRI and a mammogram every year.  Breast cancer gene (BRCA)-related cancer risk assessment is recommended for women who have family members with BRCA-related cancers. BRCA-related cancers include breast, ovarian, tubal, and peritoneal cancers. Having family members with these cancers may be associated with an increased risk for harmful changes (mutations) in the breast cancer genes BRCA1 and  BRCA2. Results of the assessment will determine the need for genetic counseling and BRCA1 and BRCA2 testing.  Your health care provider may recommend that you be screened regularly for cancer of the pelvic organs (ovaries, uterus, and vagina). This screening involves a pelvic examination, including checking for microscopic changes to the surface of your cervix (Pap test). You may be encouraged to have this screening done every 3 years, beginning at age 21.  For women ages 30-65, health care providers may recommend pelvic exams and Pap testing every 3 years, or they may recommend the Pap and pelvic exam, combined with testing for human papilloma virus (HPV), every 5 years. Some types of HPV increase your risk of cervical cancer. Testing for HPV may also be done on women of any age with unclear Pap test results.  Other health care providers may not recommend any screening for nonpregnant women who are considered low risk for pelvic cancer and who do not have symptoms. Ask your health care provider if a screening pelvic exam is right for   you.  If you have had past treatment for cervical cancer or a condition that could lead to cancer, you need Pap tests and screening for cancer for at least 20 years after your treatment. If Pap tests have been discontinued, your risk factors (such as having a new sexual partner) need to be reassessed to determine if screening should resume. Some women have medical problems that increase the chance of getting cervical cancer. In these cases, your health care provider may recommend more frequent screening and Pap tests.  Colorectal cancer can be detected and often prevented. Most routine colorectal cancer screening begins at the age of 50 years and continues through age 75 years. However, your health care provider may recommend screening at an earlier age if you have risk factors for colon cancer. On a yearly basis, your health care provider may provide home test kits to check  for hidden blood in the stool. Use of a small camera at the end of a tube, to directly examine the colon (sigmoidoscopy or colonoscopy), can detect the earliest forms of colorectal cancer. Talk to your health care provider about this at age 50, when routine screening begins. Direct exam of the colon should be repeated every 5-10 years through age 75 years, unless early forms of precancerous polyps or small growths are found.  People who are at an increased risk for hepatitis B should be screened for this virus. You are considered at high risk for hepatitis B if:  You were born in a country where hepatitis B occurs often. Talk with your health care provider about which countries are considered high risk.  Your parents were born in a high-risk country and you have not received a shot to protect against hepatitis B (hepatitis B vaccine).  You have HIV or AIDS.  You use needles to inject street drugs.  You live with, or have sex with, someone who has hepatitis B.  You get hemodialysis treatment.  You take certain medicines for conditions like cancer, organ transplantation, and autoimmune conditions.  Hepatitis C blood testing is recommended for all people born from 1945 through 1965 and any individual with known risks for hepatitis C.  Practice safe sex. Use condoms and avoid high-risk sexual practices to reduce the spread of sexually transmitted infections (STIs). STIs include gonorrhea, chlamydia, syphilis, trichomonas, herpes, HPV, and human immunodeficiency virus (HIV). Herpes, HIV, and HPV are viral illnesses that have no cure. They can result in disability, cancer, and death.  You should be screened for sexually transmitted illnesses (STIs) including gonorrhea and chlamydia if:  You are sexually active and are younger than 24 years.  You are older than 24 years and your health care provider tells you that you are at risk for this type of infection.  Your sexual activity has changed  since you were last screened and you are at an increased risk for chlamydia or gonorrhea. Ask your health care provider if you are at risk.  If you are at risk of being infected with HIV, it is recommended that you take a prescription medicine daily to prevent HIV infection. This is called preexposure prophylaxis (PrEP). You are considered at risk if:  You are sexually active and do not regularly use condoms or know the HIV status of your partner(s).  You take drugs by injection.  You are sexually active with a partner who has HIV.  Talk with your health care provider about whether you are at high risk of being infected with HIV. If   you choose to begin PrEP, you should first be tested for HIV. You should then be tested every 3 months for as long as you are taking PrEP.  Osteoporosis is a disease in which the bones lose minerals and strength with aging. This can result in serious bone fractures or breaks. The risk of osteoporosis can be identified using a bone density scan. Women ages 67 years and over and women at risk for fractures or osteoporosis should discuss screening with their health care providers. Ask your health care provider whether you should take a calcium supplement or vitamin D to reduce the rate of osteoporosis.  Menopause can be associated with physical symptoms and risks. Hormone replacement therapy is available to decrease symptoms and risks. You should talk to your health care provider about whether hormone replacement therapy is right for you.  Use sunscreen. Apply sunscreen liberally and repeatedly throughout the day. You should seek shade when your shadow is shorter than you. Protect yourself by wearing long sleeves, pants, a wide-brimmed hat, and sunglasses year round, whenever you are outdoors.  Once a month, do a whole body skin exam, using a mirror to look at the skin on your back. Tell your health care provider of new moles, moles that have irregular borders, moles that  are larger than a pencil eraser, or moles that have changed in shape or color.  Stay current with required vaccines (immunizations).  Influenza vaccine. All adults should be immunized every year.  Tetanus, diphtheria, and acellular pertussis (Td, Tdap) vaccine. Pregnant women should receive 1 dose of Tdap vaccine during each pregnancy. The dose should be obtained regardless of the length of time since the last dose. Immunization is preferred during the 27th-36th week of gestation. An adult who has not previously received Tdap or who does not know her vaccine status should receive 1 dose of Tdap. This initial dose should be followed by tetanus and diphtheria toxoids (Td) booster doses every 10 years. Adults with an unknown or incomplete history of completing a 3-dose immunization series with Td-containing vaccines should begin or complete a primary immunization series including a Tdap dose. Adults should receive a Td booster every 10 years.  Varicella vaccine. An adult without evidence of immunity to varicella should receive 2 doses or a second dose if she has previously received 1 dose. Pregnant females who do not have evidence of immunity should receive the first dose after pregnancy. This first dose should be obtained before leaving the health care facility. The second dose should be obtained 4-8 weeks after the first dose.  Human papillomavirus (HPV) vaccine. Females aged 13-26 years who have not received the vaccine previously should obtain the 3-dose series. The vaccine is not recommended for use in pregnant females. However, pregnancy testing is not needed before receiving a dose. If a female is found to be pregnant after receiving a dose, no treatment is needed. In that case, the remaining doses should be delayed until after the pregnancy. Immunization is recommended for any person with an immunocompromised condition through the age of 61 years if she did not get any or all doses earlier. During the  3-dose series, the second dose should be obtained 4-8 weeks after the first dose. The third dose should be obtained 24 weeks after the first dose and 16 weeks after the second dose.  Zoster vaccine. One dose is recommended for adults aged 30 years or older unless certain conditions are present.  Measles, mumps, and rubella (MMR) vaccine. Adults born  before 1957 generally are considered immune to measles and mumps. Adults born in 1957 or later should have 1 or more doses of MMR vaccine unless there is a contraindication to the vaccine or there is laboratory evidence of immunity to each of the three diseases. A routine second dose of MMR vaccine should be obtained at least 28 days after the first dose for students attending postsecondary schools, health care workers, or international travelers. People who received inactivated measles vaccine or an unknown type of measles vaccine during 1963-1967 should receive 2 doses of MMR vaccine. People who received inactivated mumps vaccine or an unknown type of mumps vaccine before 1979 and are at high risk for mumps infection should consider immunization with 2 doses of MMR vaccine. For females of childbearing age, rubella immunity should be determined. If there is no evidence of immunity, females who are not pregnant should be vaccinated. If there is no evidence of immunity, females who are pregnant should delay immunization until after pregnancy. Unvaccinated health care workers born before 1957 who lack laboratory evidence of measles, mumps, or rubella immunity or laboratory confirmation of disease should consider measles and mumps immunization with 2 doses of MMR vaccine or rubella immunization with 1 dose of MMR vaccine.  Pneumococcal 13-valent conjugate (PCV13) vaccine. When indicated, a person who is uncertain of his immunization history and has no record of immunization should receive the PCV13 vaccine. All adults 65 years of age and older should receive this  vaccine. An adult aged 19 years or older who has certain medical conditions and has not been previously immunized should receive 1 dose of PCV13 vaccine. This PCV13 should be followed with a dose of pneumococcal polysaccharide (PPSV23) vaccine. Adults who are at high risk for pneumococcal disease should obtain the PPSV23 vaccine at least 8 weeks after the dose of PCV13 vaccine. Adults older than 65 years of age who have normal immune system function should obtain the PPSV23 vaccine dose at least 1 year after the dose of PCV13 vaccine.  Pneumococcal polysaccharide (PPSV23) vaccine. When PCV13 is also indicated, PCV13 should be obtained first. All adults aged 65 years and older should be immunized. An adult younger than age 65 years who has certain medical conditions should be immunized. Any person who resides in a nursing home or long-term care facility should be immunized. An adult smoker should be immunized. People with an immunocompromised condition and certain other conditions should receive both PCV13 and PPSV23 vaccines. People with human immunodeficiency virus (HIV) infection should be immunized as soon as possible after diagnosis. Immunization during chemotherapy or radiation therapy should be avoided. Routine use of PPSV23 vaccine is not recommended for American Indians, [place] Natives, or people younger than 65 years unless there are medical conditions that require PPSV23 vaccine. When indicated, people who have unknown immunization and have no record of immunization should receive PPSV23 vaccine. One-time revaccination 5 years after the first dose of PPSV23 is recommended for people aged 19-64 years who have chronic kidney failure, nephrotic syndrome, asplenia, or immunocompromised conditions. People who received 1-2 doses of PPSV23 before age 65 years should receive another dose of PPSV23 vaccine at age 65 years or later if at least 5 years have passed since the previous dose. Doses of PPSV23 are not  needed for people immunized with PPSV23 at or after age 65 years.  Meningococcal vaccine. Adults with asplenia or persistent complement component deficiencies should receive 2 doses of quadrivalent meningococcal conjugate (MenACWY-D) vaccine. The doses should be obtained   at least 2 months apart. Microbiologists working with certain meningococcal bacteria, [place] recruits, people at risk during an outbreak, and people who travel to or live in countries with a high rate of meningitis should be immunized. A first-year college student up through age 34 years who is living in a residence hall should receive a dose if she did not receive a dose on or after her 16th birthday. Adults who have certain high-risk conditions should receive one or more doses of vaccine.  Hepatitis A vaccine. Adults who wish to be protected from this disease, have certain high-risk conditions, work with hepatitis A-infected animals, work in hepatitis A research labs, or travel to or work in countries with a high rate of hepatitis A should be immunized. Adults who were previously unvaccinated and who anticipate close contact with an international adoptee during the first 60 days after arrival in the [place] from a country with a high rate of hepatitis A should be immunized.  Hepatitis B vaccine. Adults who wish to be protected from this disease, have certain high-risk conditions, may be exposed to blood or other infectious body fluids, are household contacts or sex partners of hepatitis B positive people, are clients or workers in certain care facilities, or travel to or work in countries with a high rate of hepatitis B should be immunized.  Haemophilus influenzae type b (Hib) vaccine. A previously unvaccinated person with asplenia or sickle cell disease or having a scheduled splenectomy should receive 1 dose of Hib vaccine. Regardless of previous immunization, a recipient of a hematopoietic stem cell transplant should receive a  3-dose series 6-12 months after her successful transplant. Hib vaccine is not recommended for adults with HIV infection. Preventive Services / Frequency Ages 35 to 4 years  Blood pressure check.** / Every 3-5 years.  Lipid and cholesterol check.** / Every 5 years beginning at age 60.  Clinical breast exam.** / Every 3 years for women in their 71s and 10s.  BRCA-related cancer risk assessment.** / For women who have family members with a BRCA-related cancer (breast, ovarian, tubal, or peritoneal cancers).  Pap test.** / Every 2 years from ages 76 through 26. Every 3 years starting at age 61 through age 76 or 93 with a history of 3 consecutive normal Pap tests.  HPV screening.** / Every 3 years from ages 37 through ages 60 to 51 with a history of 3 consecutive normal Pap tests.  Hepatitis C blood test.** / For any individual with known risks for hepatitis C.  Skin self-exam. / Monthly.  Influenza vaccine. / Every year.  Tetanus, diphtheria, and acellular pertussis (Tdap, Td) vaccine.** / Consult your health care provider. Pregnant women should receive 1 dose of Tdap vaccine during each pregnancy. 1 dose of Td every 10 years.  Varicella vaccine.** / Consult your health care provider. Pregnant females who do not have evidence of immunity should receive the first dose after pregnancy.  HPV vaccine. / 3 doses over 6 months, if 93 and younger. The vaccine is not recommended for use in pregnant females. However, pregnancy testing is not needed before receiving a dose.  Measles, mumps, rubella (MMR) vaccine.** / You need at least 1 dose of MMR if you were born in 1957 or later. You may also need a 2nd dose. For females of childbearing age, rubella immunity should be determined. If there is no evidence of immunity, females who are not pregnant should be vaccinated. If there is no evidence of immunity, females who are  pregnant should delay immunization until after pregnancy.  Pneumococcal  13-valent conjugate (PCV13) vaccine.** / Consult your health care provider.  Pneumococcal polysaccharide (PPSV23) vaccine.** / 1 to 2 doses if you smoke cigarettes or if you have certain conditions.  Meningococcal vaccine.** / 1 dose if you are age 68 to 8 years and a Market researcher living in a residence hall, or have one of several medical conditions, you need to get vaccinated against meningococcal disease. You may also need additional booster doses.  Hepatitis A vaccine.** / Consult your health care provider.  Hepatitis B vaccine.** / Consult your health care provider.  Haemophilus influenzae type b (Hib) vaccine.** / Consult your health care provider. Ages 7 to 53 years  Blood pressure check.** / Every year.  Lipid and cholesterol check.** / Every 5 years beginning at age 25 years.  Lung cancer screening. / Every year if you are aged 11-80 years and have a 30-pack-year history of smoking and currently smoke or have quit within the past 15 years. Yearly screening is stopped once you have quit smoking for at least 15 years or develop a health problem that would prevent you from having lung cancer treatment.  Clinical breast exam.** / Every year after age 48 years.  BRCA-related cancer risk assessment.** / For women who have family members with a BRCA-related cancer (breast, ovarian, tubal, or peritoneal cancers).  Mammogram.** / Every year beginning at age 41 years and continuing for as long as you are in good health. Consult with your health care provider.  Pap test.** / Every 3 years starting at age 65 years through age 37 or 70 years with a history of 3 consecutive normal Pap tests.  HPV screening.** / Every 3 years from ages 72 years through ages 60 to 40 years with a history of 3 consecutive normal Pap tests.  Fecal occult blood test (FOBT) of stool. / Every year beginning at age 21 years and continuing until age 5 years. You may not need to do this test if you get  a colonoscopy every 10 years.  Flexible sigmoidoscopy or colonoscopy.** / Every 5 years for a flexible sigmoidoscopy or every 10 years for a colonoscopy beginning at age 35 years and continuing until age 48 years.  Hepatitis C blood test.** / For all people born from [birth year] through 1965 and any individual with known risks for hepatitis C.  Skin self-exam. / Monthly.  Influenza vaccine. / Every year.  Tetanus, diphtheria, and acellular pertussis (Tdap/Td) vaccine.** / Consult your health care provider. Pregnant women should receive 1 dose of Tdap vaccine during each pregnancy. 1 dose of Td every 10 years.  Varicella vaccine.** / Consult your health care provider. Pregnant females who do not have evidence of immunity should receive the first dose after pregnancy.  Zoster vaccine.** / 1 dose for adults aged 30 years or older.  Measles, mumps, rubella (MMR) vaccine.** / You need at least 1 dose of MMR if you were born in 1957 or later. You may also need a second dose. For females of childbearing age, rubella immunity should be determined. If there is no evidence of immunity, females who are not pregnant should be vaccinated. If there is no evidence of immunity, females who are pregnant should delay immunization until after pregnancy.  Pneumococcal 13-valent conjugate (PCV13) vaccine.** / Consult your health care provider.  Pneumococcal polysaccharide (PPSV23) vaccine.** / 1 to 2 doses if you smoke cigarettes or if you have certain conditions.  Meningococcal vaccine.** /  Consult your health care provider.  Hepatitis A vaccine.** / Consult your health care provider.  Hepatitis B vaccine.** / Consult your health care provider.  Haemophilus influenzae type b (Hib) vaccine.** / Consult your health care provider. Ages 64 years and over  Blood pressure check.** / Every year.  Lipid and cholesterol check.** / Every 5 years beginning at age 23 years.  Lung cancer screening. / Every year if you  are aged 16-80 years and have a 30-pack-year history of smoking and currently smoke or have quit within the past 15 years. Yearly screening is stopped once you have quit smoking for at least 15 years or develop a health problem that would prevent you from having lung cancer treatment.  Clinical breast exam.** / Every year after age 74 years.  BRCA-related cancer risk assessment.** / For women who have family members with a BRCA-related cancer (breast, ovarian, tubal, or peritoneal cancers).  Mammogram.** / Every year beginning at age 44 years and continuing for as long as you are in good health. Consult with your health care provider.  Pap test.** / Every 3 years starting at age 58 years through age 22 or 39 years with 3 consecutive normal Pap tests. Testing can be stopped between 65 and 70 years with 3 consecutive normal Pap tests and no abnormal Pap or HPV tests in the past 10 years.  HPV screening.** / Every 3 years from ages 64 years through ages 70 or 61 years with a history of 3 consecutive normal Pap tests. Testing can be stopped between 65 and 70 years with 3 consecutive normal Pap tests and no abnormal Pap or HPV tests in the past 10 years.  Fecal occult blood test (FOBT) of stool. / Every year beginning at age 40 years and continuing until age 27 years. You may not need to do this test if you get a colonoscopy every 10 years.  Flexible sigmoidoscopy or colonoscopy.** / Every 5 years for a flexible sigmoidoscopy or every 10 years for a colonoscopy beginning at age 7 years and continuing until age 32 years.  Hepatitis C blood test.** / For all people born from [birth year] through 1965 and any individual with known risks for hepatitis C.  Osteoporosis screening.** / A one-time screening for women ages 30 years and over and women at risk for fractures or osteoporosis.  Skin self-exam. / Monthly.  Influenza vaccine. / Every year.  Tetanus, diphtheria, and acellular pertussis (Tdap/Td)  vaccine.** / 1 dose of Td every 10 years.  Varicella vaccine.** / Consult your health care provider.  Zoster vaccine.** / 1 dose for adults aged 35 years or older.  Pneumococcal 13-valent conjugate (PCV13) vaccine.** / Consult your health care provider.  Pneumococcal polysaccharide (PPSV23) vaccine.** / 1 dose for all adults aged 46 years and older.  Meningococcal vaccine.** / Consult your health care provider.  Hepatitis A vaccine.** / Consult your health care provider.  Hepatitis B vaccine.** / Consult your health care provider.  Haemophilus influenzae type b (Hib) vaccine.** / Consult your health care provider. ** Family history and personal history of risk and conditions may change your health care provider's recommendations.   This information is not intended to replace advice given to you by your health care provider. Make sure you discuss any questions you have with your health care provider.   Document Released: 12/01/2001 Document Revised: 10/26/2014 Document Reviewed: 03/02/2011 Elsevier Interactive Patient Education [employer].

## 2016-05-02 ENCOUNTER — Encounter: Payer: Self-pay | Admitting: Internal Medicine

## 2016-05-02 LAB — HEPATITIS C ANTIBODY: HCV AB: NEGATIVE

## 2016-05-04 ENCOUNTER — Encounter: Payer: Self-pay | Admitting: Internal Medicine

## 2016-05-11 NOTE — Telephone Encounter (Signed)
FYI

## 2016-05-31 ENCOUNTER — Other Ambulatory Visit: Payer: Self-pay | Admitting: Internal Medicine

## 2016-06-03 MED ORDER — BUPROPION HCL ER (XL) 300 MG PO TB24: 300.0000 mg | ORAL_TABLET | Freq: Every day | ORAL | 1 refills | Status: DC

## 2016-06-03 MED ORDER — SIMVASTATIN 40 MG PO TABS: 40.0000 mg | ORAL_TABLET | Freq: Every day | ORAL | 1 refills | Status: DC

## 2016-06-03 MED ORDER — LOSARTAN POTASSIUM 100 MG PO TABS: 50.0000 mg | ORAL_TABLET | Freq: Every day | ORAL | 1 refills | Status: DC

## 2016-06-04 NOTE — Telephone Encounter (Signed)
erx done for all.

## 2016-09-18 ENCOUNTER — Ambulatory Visit (INDEPENDENT_AMBULATORY_CARE_PROVIDER_SITE_OTHER): Admitting: Internal Medicine

## 2016-09-18 ENCOUNTER — Encounter: Payer: Self-pay | Admitting: Internal Medicine

## 2016-09-18 DIAGNOSIS — D721 Eosinophilia, unspecified: Secondary | ICD-10-CM

## 2016-09-18 DIAGNOSIS — J4520 Mild intermittent asthma, uncomplicated: Secondary | ICD-10-CM

## 2016-09-18 MED ORDER — DOXYCYCLINE HYCLATE 100 MG PO TABS: ORAL_TABLET | ORAL | 5 refills | Status: DC

## 2016-09-18 MED ORDER — PROMETHAZINE-CODEINE 6.25-10 MG/5ML PO SYRP: 5.0000 mL | ORAL_SOLUTION | ORAL | 5 refills | Status: AC | PRN

## 2016-09-18 MED ORDER — FLUTICASONE-SALMETEROL 100-50 MCG/DOSE IN AEPB: INHALATION_SPRAY | RESPIRATORY_TRACT | 3 refills | Status: DC

## 2016-09-18 MED ORDER — LORAZEPAM 0.5 MG PO TABS: 0.5000 mg | ORAL_TABLET | Freq: Three times a day (TID) | ORAL | 5 refills | Status: DC | PRN

## 2016-09-18 MED ORDER — BENZONATATE 200 MG PO CAPS: 200.0000 mg | ORAL_CAPSULE | Freq: Three times a day (TID) | ORAL | 3 refills | Status: DC | PRN

## 2016-09-18 NOTE — Patient Instructions (Signed)
Meds refilled, noting change to the weaker Advair dose, if you still find you need it.  Please call if we can help

## 2016-09-18 NOTE — Assessment & Plan Note (Signed)
Very stable. Plan-try off of Advair with prescription change to Advair 100 if needed. Refill benzonatate Perles, doxycycline, cough syrup and lorazepam to hold with discussion done.

## 2016-09-18 NOTE — Progress Notes (Signed)
Subjective:    Patient ID: Debbie Hogan, female    DOB: Apr 01, 1951, 65 y.o.   MRN: [id number]  HPI F never smoker, followed for asthma, hx DVT, HBP, hx APBA with abnormal cxr. Years ago, presented with lung nodules of retained mucus- Suspected but never proved ABPA. January 28, 2010 after a pneumonia 2 months prior. EOS were 11.6%, down from 28 %.  .    09/19/2015-64 yoF never smoker, followed for asthma, hx DVT, HBP, hx APBA with abnormal cxr, nasal polyps. Originally met years ago, presenting with lung nodules of retained mucus- never proved ABPA FOLLOW FOR: Asthma is doing well, follow up for pneumonia; Pt says that PCP (Dr. Linna Darner) is leaving, wants to know who Dr. Annamaria Boots recommends for PCP. Feeling very well now after pneumonia 6 weeks ago. Rare need for rescue inhaler.  09/18/2016-64 year old female never smoker followed for asthma, history DVT, HBP, history ABPA with abnormal CXR, nasal polyps. Originally presented with lung nodules of retained mucus-never proved ABPA FOLLOWS FOR: pt doing well today.  notes an exacerbation X2 weeks ago that required doxycycline, but now has no complaints.  Requesting benzonatate, lorazepam, and cough syrup to have on hand Now get some wheeze mostly with incidental colds. No sinus complaints. Using Advair once daily and we question need to continue. Has had 2 or 3 episodes of pneumonia in recent years without obvious aspiration. Up-to-date on vaccines. Rescue inhaler once or twice a year. No sleep disturbance. CXR 02/02/2016.  IMPRESSION: Negative two view chest x-ray  ROS-see HPI Constitutional:   No-   weight loss, night sweats, fevers, chills, fatigue, lassitude. HEENT:   + headaches, difficulty swallowing, tooth/dental problems, sore throat,       No-  sneezing, itching, ear ache, nasal congestion, post nasal drip,  CV:  No-   chest pain, orthopnea, PND, swelling in lower extremities, anasarca,   dizziness, palpitations Resp: No-   shortness of  breath with exertion or at rest.              No-   productive cough,  no- non-productive cough,  No- coughing up of blood.         No- change in color of mucus.  No- wheezing.   Skin: No-   rash or lesions. GI:  No-   heartburn, indigestion, abdominal pain, nausea, vomiting,  GU:  MS:  No-   joint pain or swelling.   Neuro-     nothing unusual Psych:  No- change in mood or affect. No depression or anxiety.  No memory loss.  OBJ- Physical Exam General- Alert, Oriented, Affect-appropriate, Distress- none acute, WDWN Skin- rash-none, lesions- none, excoriation- none Lymphadenopathy- none Head- atraumatic            Eyes- Gross vision intact, PERRLA, conjunctivae and secretions clear            Ears- Hearing, canals-normal            Nose-, no-Septal dev, mucus, erosion, perforation             Throat- Mallampati II , mucosa clear , drainage- none, tonsils- atrophic Neck- flexible , trachea midline, no stridor , thyroid nl, carotid no bruit Chest - symmetrical excursion , unlabored           Heart/CV- RRR , no murmur , no gallop  , no rub, nl s1 s2                           -  JVD- none , edema- none, stasis changes- none, varices- none           Lung- clear to P&A, wheeze- none, cough- none , dullness-none, rub- none           Chest wall-  Abd-  Br/ Gen/ Rectal- Not done, not indicated Extrem- cyanosis- none, clubbing, none, atrophy- none, strength- nl Neuro- grossly intact to observation

## 2016-09-18 NOTE — Assessment & Plan Note (Signed)
Remote pattern of mucus retention and airways with eosinophilia had raise question of ABPA never conclusively shown. No recurrence of that pattern in years.

## 2016-10-13 ENCOUNTER — Other Ambulatory Visit: Payer: Self-pay | Admitting: Internal Medicine

## 2016-10-13 NOTE — Telephone Encounter (Signed)
Sent in a 90 day supply.   LOV note did not specify if pt needed to come in for a follow up. No follow up scheduled.

## 2016-10-19 ENCOUNTER — Other Ambulatory Visit: Payer: Self-pay | Admitting: Internal Medicine

## 2016-10-21 ENCOUNTER — Telehealth: Payer: Self-pay | Admitting: Internal Medicine

## 2016-10-21 NOTE — Telephone Encounter (Signed)
Spoke with pharmacist, clarification given for medication.  Nothing further needed.

## 2016-10-22 ENCOUNTER — Encounter: Payer: Self-pay | Admitting: Internal Medicine

## 2016-10-22 ENCOUNTER — Other Ambulatory Visit: Payer: Self-pay

## 2016-10-26 NOTE — Progress Notes (Signed)
Patient ID: Debbie Hogan, female   DOB: 1951-09-29, 66 y.o.   MRN: [id number]     Cardiology Office Note   Date:  10/29/2016   ID:  Debbie Hogan, DOB 02-09-1951, MRN [id number]  PCP:  Scarlette Calico, MD  Cardiologist:   Jenkins Rouge, MD   Chief Complaint  Patient presents with  . Mitral Valve Prolapse      History of Present Illness: Debbie Hogan is a 66 y.o. female who presents for chest pain.  Seen in ER at [facility] 4/16 with abdominal pain that radiated up toward her esophagus. Similar pains many times after eating greasy food but didn't last as long. Epigastric pain reproducible on palpation. R/O ECG initially tachycardic rate 107 with lateral T wave changes in face of HTN and some anxiety. Troponin negative x 2.  [place] of gallbladder was normal Lipase and LFT;s were normal CRF;s HTN.  Previously seen by Dr Mare Ferrari for MVP   During event she was very stressed Coming home from [facility] with sister her significant other and 36 yo mother. [place].  Still mourning loss of her husband in 03/03/23 . 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 Anxiety    PMH of  . Colonic polyp 2008  . DVT (deep venous thrombosis) ([place])    LLE 2007 (post trauma and prolonged air travel), Lovenox x 6 months  . Hyperlipemia    see NMR data  . Hypertension   . Kidney stone    X1; spontaneous passgae  . Migraine   . MVP (mitral valve prolapse)    Dr.Brackbill;Cardiology    Past Surgical History:  Procedure Laterality Date  . COLONOSCOPY W/ POLYPECTOMY  2009   Dr Earle Gell ,[facility]  . DILATION AND CURETTAGE OF UTERUS  2014   neg pathology  . NASAL SINUS  SURGERY  2009   polyps;Dr.Rosen  . OOPHORECTOMY     3  benign cysts  . TONSILLECTOMY       Current Outpatient Prescriptions  Medication Sig Dispense Refill  . benzonatate (TESSALON) 200 MG capsule Take 1 capsule (200 mg total) by mouth 3 (three) times daily as needed for cough. 30 capsule 3  . buPROPion (WELLBUTRIN XL) 300 MG 24 hr tablet Take 1 tablet (300 mg total) by mouth daily. 90 tablet 0  . calcium citrate-vitamin D (CITRACAL+D) 315-200 MG-UNIT per tablet Take 1 tablet by mouth every other day.     Marland Kitchen doxycycline (VIBRA-TABS) 100 MG tablet 2 today then one daily 8 tablet 5  . estradiol (VIVELLE-DOT) 0.0375 MG/24HR Place 1 patch onto the skin 2 (two) times a week. Place on Monday and Thursday    . fluticasone (FLONASE) 50 MCG/ACT nasal spray USE 2 SPRAYS IN EACH NOSTRIL DAILY 48 g 1  . LORazepam (ATIVAN) 0.5 MG tablet Take 1 tablet (0.5 mg total) by mouth every 8 (eight) hours as needed for anxiety. 30 tablet 5  . losartan (COZAAR) 100 MG tablet Take 0.5 tablets (50 mg total) by mouth daily. 90 tablet 1  . PROAIR HFA 108 (90 Base) MCG/ACT inhaler USE 2 INHALATIONS INTO THE LUNGS  EVERY  6 HOURS AS NEEDED FOR WHEEZING OR SHORTNESS OF BREATH 25.5 g 1  . progesterone (PROMETRIUM) 200 MG capsule Take 1 capsule (200 mg total) by mouth daily. Take for 12 days every 3rd month 30 capsule 0  . simvastatin (ZOCOR) 40 MG tablet Take 1 tablet (40 mg total) by mouth at bedtime. 90 tablet 0   No current facility-administered medications for this visit.     Allergies:   Kiwi extract; Other; and Montelukast sodium    Social History:  The patient  reports that she quit smoking about 36 years ago. She has never used smokeless tobacco. She reports that she drinks about 3.0 oz of alcohol per week . She reports that she does not use drugs.   Family History:  The patient's family history includes Alcohol abuse in her father, sister, sister, and sister; Alzheimer's disease in her paternal uncle; Asthma in  her father; Dementia in her father; Depression in her father, mother, and sister; Hypertension in her mother and sister; Hypotension in her father; Ovarian cancer in her maternal aunt.    ROS:  Please see the history of present illness.   Otherwise, review of systems are positive for none.   All other systems are reviewed and negative.    PHYSICAL EXAM: VS:  BP 110/60   Pulse 84   Ht 5\' 5"  (1.651 m)   Wt 138 lb 6.4 oz (62.8 kg)   SpO2 97%   BMI 23.03 kg/m  , BMI Body mass index is 23.03 kg/m. Affect appropriate Healthy:  appears stated age 87: normal Neck supple with no adenopathy JVP normal no bruits no thyromegaly Lungs clear with no wheezing and good diaphragmatic motion Heart:  S1/S2 no murmur, no rub, gallop or click PMI normal Abdomen: benighn, BS positve, no tenderness, no AAA no bruit.  No HSM or HJR Distal pulses intact with no bruits No edema Neuro non-focal Skin warm and dry No muscular weakness    EKG:  4/17  ST rate 107 LAE no acute changes    Recent Labs: 05/01/2016: ALT 15; BUN 21; Creatinine, Ser 0.66; Hemoglobin 14.6; Platelets 420.0; Potassium 4.0; Sodium 139; TSH 1.22    Lipid Panel    Component Value Date/Time   CHOL 188 05/01/2016 1404   CHOL 224 (H) 10/23/2014 0802   TRIG 120.0 05/01/2016 1404   TRIG 161 (H) 10/23/2014 0802   HDL 60.70 05/01/2016 1404   HDL 64 10/23/2014 0802   CHOLHDL 3 05/01/2016 1404   VLDL 24.0 05/01/2016 1404   LDLCALC 103 (H) 05/01/2016 1404   LDLCALC 128 (H) 10/23/2014 0802   LDLDIRECT 133.7 01/08/2010 0923      Wt Readings from Last 3 Encounters:  10/29/16 138 lb 6.4 oz (62.8 kg)  09/18/16 139 lb 9.6 oz (63.3 kg)  05/01/16 139 lb 2 oz (63.1 kg)      Other studies Reviewed: Additional studies/ records that were reviewed today include: [facility] ER notes, CXR, [facility] labs and ECGls .    ASSESSMENT AND PLAN:  1.  Chest Pain related to panic attack most likely stress echo normal  2. Abdominal Pain resolved  f/u primary  3. MVP by history no frank prolapse on echo no need for SBE  4. HTN: continue ARB    Current medicines are reviewed at length with the patient today.  The patient does not have concerns regarding medicines.  The following changes have been made:  no change  Labs/ tests ordered today include: None  No orders of the defined types were placed in this encounter.    Disposition:   FU with me PRN     Signed, Jenkins Rouge, MD  10/29/2016 9:13 AM    [facility] [place], [place], [place]  [address] Phone: [phone]; Fax: [phone]

## 2016-10-29 ENCOUNTER — Encounter: Payer: Self-pay | Admitting: Cardiovascular Disease

## 2016-10-29 ENCOUNTER — Ambulatory Visit (INDEPENDENT_AMBULATORY_CARE_PROVIDER_SITE_OTHER): Payer: Medicare Other | Admitting: Cardiovascular Disease

## 2016-10-29 VITALS — BP 110/60 | HR 84 | Ht 65.0 in | Wt 138.4 lb

## 2016-10-29 DIAGNOSIS — I341 Nonrheumatic mitral (valve) prolapse: Secondary | ICD-10-CM | POA: Diagnosis not present

## 2016-10-29 NOTE — Patient Instructions (Signed)
Medication Instructions:  Your physician recommends that you continue on your current medications as directed. Please refer to the Current Medication list given to you today.  Labwork: NONE  Testing/Procedures: NONE  Follow-Up: Your physician wants you to follow-up as needed with  Dr. Nishan.    If you need a refill on your cardiac medications before your next appointment, please call your pharmacy.    

## 2016-12-23 DIAGNOSIS — N952 Postmenopausal atrophic vaginitis: Secondary | ICD-10-CM | POA: Diagnosis not present

## 2016-12-23 DIAGNOSIS — Z113 Encounter for screening for infections with a predominantly sexual mode of transmission: Secondary | ICD-10-CM | POA: Diagnosis not present

## 2017-01-07 ENCOUNTER — Encounter: Payer: Self-pay | Admitting: Internal Medicine

## 2017-01-07 MED ORDER — FLUTICASONE-SALMETEROL 100-50 MCG/DOSE IN AEPB: 1.0000 | INHALATION_SPRAY | Freq: Two times a day (BID) | RESPIRATORY_TRACT | 1 refills | Status: DC | PRN

## 2017-01-08 ENCOUNTER — Telehealth: Payer: Self-pay | Admitting: Internal Medicine

## 2017-01-08 ENCOUNTER — Other Ambulatory Visit: Payer: Self-pay | Admitting: Internal Medicine

## 2017-01-08 MED ORDER — DOXYCYCLINE HYCLATE 100 MG PO TABS: ORAL_TABLET | ORAL | 0 refills | Status: DC

## 2017-01-08 NOTE — Telephone Encounter (Signed)
Refill sent to preferred pharmacy.  Pt aware.  Nothing further needed.

## 2017-01-11 ENCOUNTER — Other Ambulatory Visit: Payer: Self-pay | Admitting: Internal Medicine

## 2017-01-28 ENCOUNTER — Telehealth: Payer: Self-pay | Admitting: Internal Medicine

## 2017-01-28 MED ORDER — PREDNISONE 20 MG PO TABS: 20.0000 mg | ORAL_TABLET | Freq: Every day | ORAL | 0 refills | Status: DC

## 2017-01-28 NOTE — Telephone Encounter (Signed)
Called and spoke to pt. Informed her of the recs per RB. Rx sent to preferred pharmacy. Pt verbalized understanding and denied any further questions or concerns at this time.   Pt has a current f/u with CY in 09/2017, Dr. Annamaria Boots please advise if would like to see pt sooner than her December 2018 visit. Thanks.

## 2017-01-28 NOTE — Telephone Encounter (Signed)
I believe she needs to be treated for a flare > give her prednisone 20mg  daily x 5 days Also, have her restart her Advair bid, follow up with Dr Annamaria Boots to decide whether to stay on this long term

## 2017-01-28 NOTE — Telephone Encounter (Signed)
pt reports of wheezing, chest tightness, prod cough with yellow mucus & increased fatigued x58mo Denies any fever, chills or sweats. Pt requested apt with CY, pt agree that CY is out of office until Monday.  I advised pt that I can send message to on call dr, pt agreed.   RB please advise, as CY is out of office. Thanks.

## 2017-01-29 ENCOUNTER — Other Ambulatory Visit: Payer: Self-pay | Admitting: Internal Medicine

## 2017-02-01 NOTE — Telephone Encounter (Signed)
Please see if I can work her in in a week or two

## 2017-02-01 NOTE — Telephone Encounter (Signed)
Spoke with patient-states she is feeling better since being on prednisone Rx. No longer needs appointment. Nothing more needed at this time.

## 2017-02-01 NOTE — Telephone Encounter (Signed)
Pt can be seen Tuesday 02/09/17 at 11:30 am slot. Thanks.

## 2017-02-01 NOTE — Telephone Encounter (Signed)
lmom tcb x1 please see [name]'s previous message

## 2017-02-02 ENCOUNTER — Encounter: Payer: Self-pay | Admitting: Family Medicine

## 2017-02-02 DIAGNOSIS — Z124 Encounter for screening for malignant neoplasm of cervix: Secondary | ICD-10-CM | POA: Diagnosis not present

## 2017-02-02 DIAGNOSIS — Z6821 Body mass index (BMI) 21.0-21.9, adult: Secondary | ICD-10-CM | POA: Diagnosis not present

## 2017-02-02 DIAGNOSIS — Z1231 Encounter for screening mammogram for malignant neoplasm of breast: Secondary | ICD-10-CM | POA: Diagnosis not present

## 2017-02-05 ENCOUNTER — Other Ambulatory Visit: Payer: Self-pay | Admitting: Internal Medicine

## 2017-02-05 MED ORDER — FLUTICASONE-SALMETEROL 100-50 MCG/DOSE IN AEPB: 1.0000 | INHALATION_SPRAY | Freq: Two times a day (BID) | RESPIRATORY_TRACT | 3 refills | Status: DC | PRN

## 2017-03-18 ENCOUNTER — Ambulatory Visit (INDEPENDENT_AMBULATORY_CARE_PROVIDER_SITE_OTHER): Payer: Medicare Other | Admitting: Internal Medicine

## 2017-03-18 ENCOUNTER — Other Ambulatory Visit (INDEPENDENT_AMBULATORY_CARE_PROVIDER_SITE_OTHER): Payer: Medicare Other

## 2017-03-18 ENCOUNTER — Encounter: Payer: Self-pay | Admitting: Internal Medicine

## 2017-03-18 VITALS — BP 120/68 | HR 68 | Resp 16 | Ht 65.5 in | Wt 131.0 lb

## 2017-03-18 DIAGNOSIS — J3089 Other allergic rhinitis: Secondary | ICD-10-CM

## 2017-03-18 DIAGNOSIS — J45901 Unspecified asthma with (acute) exacerbation: Secondary | ICD-10-CM

## 2017-03-18 DIAGNOSIS — J302 Other seasonal allergic rhinitis: Secondary | ICD-10-CM | POA: Diagnosis not present

## 2017-03-18 DIAGNOSIS — J4521 Mild intermittent asthma with (acute) exacerbation: Secondary | ICD-10-CM | POA: Diagnosis not present

## 2017-03-18 DIAGNOSIS — D721 Eosinophilia, unspecified: Secondary | ICD-10-CM

## 2017-03-18 LAB — CBC WITH DIFFERENTIAL/PLATELET
BASOS ABS: 0.1 10*3/uL (ref 0.0–0.1)
Basophils Relative: 1.4 % (ref 0.0–3.0)
Eosinophils Absolute: 0.6 10*3/uL (ref 0.0–0.7)
Eosinophils Relative: 7.3 % — ABNORMAL HIGH (ref 0.0–5.0)
HEMATOCRIT: 45.5 % (ref 36.0–46.0)
HEMOGLOBIN: 15.4 g/dL — AB (ref 12.0–15.0)
LYMPHS PCT: 34.4 % (ref 12.0–46.0)
Lymphs Abs: 2.8 10*3/uL (ref 0.7–4.0)
MCHC: 33.8 g/dL (ref 30.0–36.0)
MCV: 91.7 fl (ref 78.0–100.0)
MONOS PCT: 6.5 % (ref 3.0–12.0)
Monocytes Absolute: 0.5 10*3/uL (ref 0.1–1.0)
NEUTROS PCT: 50.4 % (ref 43.0–77.0)
Neutro Abs: 4 10*3/uL (ref 1.4–7.7)
Platelets: 425 10*3/uL — ABNORMAL HIGH (ref 150.0–400.0)
RBC: 4.95 Mil/uL (ref 3.87–5.11)
RDW: 13.8 % (ref 11.5–15.5)
WBC: 8 10*3/uL (ref 4.0–10.5)

## 2017-03-18 LAB — POCT EXHALED NITRIC OXIDE: FENO LEVEL (PPB): 72

## 2017-03-18 NOTE — Progress Notes (Signed)
Subjective:    Patient ID: Debbie Hogan, female    DOB: Mar 02, 1951, 66 y.o.   MRN: [id number]  HPI F never smoker, followed for asthma, hx DVT, HBP, hx APBA with abnormal cxr. Years ago, presented with lung nodules of retained mucus- Suspected but never proved ABPA. January 28, 2010 after a pneumonia 2 months prior. EOS were 11.6%, down from 28 %.  .FENO 03/18/17- 72 (H) Office Spirometry 03/18/17-WNL-FVC 3.21/96%, FEV1 2.34/91%, ratio 0.73, FEF 25-75% 1.81/82%.  -----------------------------------------------------------------------------  09/19/2015-64 yoF never smoker, followed for asthma, hx DVT, HBP, hx APBA with abnormal cxr, nasal polyps. Originally met years ago, presenting with lung nodules of retained mucus- never proved ABPA FOLLOW FOR: Asthma is doing well, follow up for pneumonia; Pt says that PCP (Dr. Linna Darner) is leaving, wants to know who Dr. Annamaria Boots recommends for PCP. Feeling very well now after pneumonia 6 weeks ago. Rare need for rescue inhaler.  09/18/2016-66 year old female never smoker followed for asthma, history DVT, HBP, history ABPA with abnormal CXR, nasal polyps. Originally presented with lung nodules of retained mucus-never proved ABPA FOLLOWS FOR: pt doing well today.  notes an exacerbation X2 weeks ago that required doxycycline, but now has no complaints.  Requesting benzonatate, lorazepam, and cough syrup to have on hand Now get some wheeze mostly with incidental colds. No sinus complaints. Using Advair once daily and we question need to continue. Has had 2 or 3 episodes of pneumonia in recent years without obvious aspiration. Up-to-date on vaccines. Rescue inhaler once or twice a year. No sleep disturbance. CXR 02/02/2016.  IMPRESSION: Negative two view chest x-ray  03/18/17- 66 year old female former smoker followed for asthma, history DVT, HBP, history ABPA with abnormal CXR, nasal polyps. Originally presented with lung nodules of retained mucus-never proved  ABPA Nasal Congestion patient states that some times her ears pop and she gets dizzy .  Was going to try off Advair EOS 05/01/16- 1.4K (H) Persistent increased chest congestion over the past month without fever or sweat. Cough productive white to yellow green with no blood. No adenopathy. Nasal congestion. Occasional vertigo with left ear popping. Doxycycline in March didn't last long enough. Resumed Advair 100. Had a separate brief cold lasting just 2 or 3 days after recent trip to [place] but says that was different. Occasional use of rescue inhaler. FENO 03/18/17- 72 (H) Office Spirometry 03/18/17-WNL-FVC 3.21/96%, FEV1 2.34/91%, ratio 0.73, FEF 25-75% 1.81/82%.  ROS-see HPI    += pos Constitutional:   No-   weight loss, night sweats, fevers, chills, fatigue, lassitude. HEENT:   + headaches, difficulty swallowing, tooth/dental problems, sore throat,       No-  sneezing, itching, ear ache, +nasal congestion, post nasal drip,  CV:  No-   chest pain, orthopnea, PND, swelling in lower extremities, anasarca,   +dizziness, palpitations Resp: No-   shortness of breath with exertion or at rest.              +  productive cough,  no- non-productive cough,  No- coughing up of blood.         No- change in color of mucus.  No- wheezing.   Skin: No-   rash or lesions. GI:  No-   heartburn, indigestion, abdominal pain, nausea, vomiting,  GU:  MS:  No-   joint pain or swelling.   Neuro-     nothing unusual Psych:  No- change in mood or affect. No depression or anxiety.  No memory loss.  OBJ- Physical Exam General- Alert,  Oriented, Affect-appropriate, Distress- none acute, WDWN Skin- rash-none, lesions- none, excoriation- none Lymphadenopathy- none Head- atraumatic            Eyes- Gross vision intact, PERRLA, conjunctivae and secretions clear            Ears- + bilateral cerumen            Nose-, no-Septal dev, mucus, erosion, perforation             Throat- Mallampati II , mucosa clear , drainage-  none, tonsils- atrophic Neck- flexible , trachea midline, no stridor , thyroid nl, carotid no bruit Chest - symmetrical excursion , unlabored           Heart/CV- RRR , no murmur , no gallop  , no rub, nl s1 s2                           - JVD- none , edema- none, stasis changes- none, varices- none           Lung- + few crackles/unlabored, wheeze- none, cough- none , dullness-none, rub- none           Chest wall-  Abd-  Br/ Gen/ Rectal- Not done, not indicated Extrem- cyanosis- none, clubbing, none, atrophy- none, strength- nl Neuro- grossly intact to observation

## 2017-03-18 NOTE — Assessment & Plan Note (Signed)
We are tracking her for remote history of ABPA and sustained eosinophilia Plan-CBC with differential

## 2017-03-18 NOTE — Assessment & Plan Note (Addendum)
Mild sustained exacerbation. Timing and elevated FENO suggest possibility at least some of this is related to spring pollen allergy. With an update check for that. Cover bronchitis component with repeat trial of doxycycline which she has on hand. Plan-doxycycline 7 days, lab for CBC with differential to check eosinophils and WBC. Continue regular use of Advair and suggest use of rescue inhaler if needed.

## 2017-03-18 NOTE — Assessment & Plan Note (Signed)
Nonspecific exacerbation-pollen allergy versus sustained viral syndrome this spring recognizing airplane exposure trip to [place] Plan-doxycycline, decongestant as needed.

## 2017-03-18 NOTE — Patient Instructions (Signed)
Ok to take another round of doxycycline  Order- Lab- CBC w diff       Dx asthmatic bronchitis exacerbation  Order- FENO  Order- Office Spirormetry  Ok to continue Advair 100

## 2017-03-19 ENCOUNTER — Ambulatory Visit (INDEPENDENT_AMBULATORY_CARE_PROVIDER_SITE_OTHER): Payer: Medicare Other | Admitting: Psychology

## 2017-03-19 DIAGNOSIS — F4321 Adjustment disorder with depressed mood: Secondary | ICD-10-CM

## 2017-03-28 DIAGNOSIS — H6122 Impacted cerumen, left ear: Secondary | ICD-10-CM | POA: Diagnosis not present

## 2017-03-28 DIAGNOSIS — J45909 Unspecified asthma, uncomplicated: Secondary | ICD-10-CM | POA: Diagnosis not present

## 2017-03-28 DIAGNOSIS — I10 Essential (primary) hypertension: Secondary | ICD-10-CM | POA: Diagnosis not present

## 2017-04-11 ENCOUNTER — Other Ambulatory Visit: Payer: Self-pay | Admitting: Internal Medicine

## 2017-04-13 ENCOUNTER — Ambulatory Visit (INDEPENDENT_AMBULATORY_CARE_PROVIDER_SITE_OTHER): Payer: Medicare Other | Admitting: Internal Medicine

## 2017-04-13 ENCOUNTER — Other Ambulatory Visit (INDEPENDENT_AMBULATORY_CARE_PROVIDER_SITE_OTHER): Payer: Medicare Other

## 2017-04-13 ENCOUNTER — Encounter: Payer: Self-pay | Admitting: Internal Medicine

## 2017-04-13 VITALS — BP 136/86 | HR 79 | Temp 97.9°F | Resp 16 | Ht 65.5 in | Wt 133.0 lb

## 2017-04-13 DIAGNOSIS — H538 Other visual disturbances: Secondary | ICD-10-CM

## 2017-04-13 DIAGNOSIS — I10 Essential (primary) hypertension: Secondary | ICD-10-CM | POA: Diagnosis not present

## 2017-04-13 DIAGNOSIS — H8142 Vertigo of central origin, left ear: Secondary | ICD-10-CM

## 2017-04-13 DIAGNOSIS — H9312 Tinnitus, left ear: Secondary | ICD-10-CM | POA: Insufficient documentation

## 2017-04-13 DIAGNOSIS — R7989 Other specified abnormal findings of blood chemistry: Secondary | ICD-10-CM

## 2017-04-13 DIAGNOSIS — IMO0001 Reserved for inherently not codable concepts without codable children: Secondary | ICD-10-CM

## 2017-04-13 LAB — CBC WITH DIFFERENTIAL/PLATELET
BASOS ABS: 0.1 10*3/uL (ref 0.0–0.1)
BASOS PCT: 1.3 % (ref 0.0–3.0)
Eosinophils Absolute: 0.7 10*3/uL (ref 0.0–0.7)
Eosinophils Relative: 7.7 % — ABNORMAL HIGH (ref 0.0–5.0)
HEMATOCRIT: 46.4 % — AB (ref 36.0–46.0)
Hemoglobin: 15.5 g/dL — ABNORMAL HIGH (ref 12.0–15.0)
LYMPHS ABS: 2.1 10*3/uL (ref 0.7–4.0)
Lymphocytes Relative: 22.7 % (ref 12.0–46.0)
MCHC: 33.5 g/dL (ref 30.0–36.0)
MCV: 91.6 fl (ref 78.0–100.0)
MONOS PCT: 5.7 % (ref 3.0–12.0)
Monocytes Absolute: 0.5 10*3/uL (ref 0.1–1.0)
NEUTROS ABS: 5.7 10*3/uL (ref 1.4–7.7)
NEUTROS PCT: 62.6 % (ref 43.0–77.0)
PLATELETS: 356 10*3/uL (ref 150.0–400.0)
RBC: 5.06 Mil/uL (ref 3.87–5.11)
RDW: 13.5 % (ref 11.5–15.5)
WBC: 9.1 10*3/uL (ref 4.0–10.5)

## 2017-04-13 LAB — SEDIMENTATION RATE: SED RATE: 6 mm/h (ref 0–30)

## 2017-04-13 NOTE — Progress Notes (Signed)
Subjective:  Patient ID: Debbie Hogan, female    DOB: 04/29/1951  Age: 66 y.o. MRN: [id number]  CC: Blurred Vision   HPI Debbie Hogan presents for a several month history of visual disturbance that she describes as occasional squiggly lines in her vision and occasional episodes of blurred vision. She has a history of migraines but has not experienced any headaches. She complains of intermittent tinnitus in her left ear with dizziness and vertigo. She denies numbness, weakness, tingling. She's had no episodes of slurred speech, ataxia, or loss of hearing.  Outpatient Medications Prior to Visit  Medication Sig Dispense Refill  . benzonatate (TESSALON) 200 MG capsule Take 1 capsule (200 mg total) by mouth 3 (three) times daily as needed for cough. 30 capsule 3  . buPROPion (WELLBUTRIN XL) 300 MG 24 hr tablet TAKE 1 TABLET DAILY 90 tablet 0  . calcium citrate-vitamin D (CITRACAL+D) 315-200 MG-UNIT per tablet Take 1 tablet by mouth every other day.     Marland Kitchen doxycycline (VIBRA-TABS) 100 MG tablet AT THE START OF THERAPY TAKE 2 TABLETS TODAY THEN ONE DAILY THEREAFTER 8 tablet 5  . estradiol (VIVELLE-DOT) 0.0375 MG/24HR Place 1 patch onto the skin 2 (two) times a week. Place on Monday and Thursday    . fluticasone (FLONASE) 50 MCG/ACT nasal spray USE 2 SPRAYS IN EACH NOSTRIL DAILY 48 g 1  . Fluticasone-Salmeterol (ADVAIR DISKUS) 100-50 MCG/DOSE AEPB Inhale 1 puff into the lungs 2 (two) times daily as needed. 3 each 3  . LORazepam (ATIVAN) 0.5 MG tablet Take 1 tablet (0.5 mg total) by mouth every 8 (eight) hours as needed for anxiety. 30 tablet 5  . losartan (COZAAR) 100 MG tablet Take 0.5 tablets (50 mg total) by mouth daily. 90 tablet 1  . predniSONE (DELTASONE) 20 MG tablet Take 1 tablet (20 mg total) by mouth daily with breakfast. 5 tablet 0  . PROAIR HFA 108 (90 Base) MCG/ACT inhaler USE 2 INHALATIONS INTO THE LUNGS  EVERY 6 HOURS AS NEEDED FOR WHEEZING OR SHORTNESS OF BREATH 25.5 g 1  .  progesterone (PROMETRIUM) 200 MG capsule Take 1 capsule (200 mg total) by mouth daily. Take for 12 days every 3rd month 30 capsule 0  . simvastatin (ZOCOR) 40 MG tablet TAKE 1 TABLET AT BEDTIME 90 tablet 0   No facility-administered medications prior to visit.     ROS Review of Systems  Constitutional: Negative.   HENT: Positive for tinnitus. Negative for congestion, facial swelling, nosebleeds, postnasal drip, rhinorrhea, sinus pain, sinus pressure, trouble swallowing and voice change.   Eyes: Negative for visual disturbance.  Respiratory: Negative.  Negative for cough, chest tightness, shortness of breath and wheezing.   Cardiovascular: Negative for chest pain, palpitations and leg swelling.  Gastrointestinal: Negative for abdominal pain, constipation, diarrhea, nausea and vomiting.  Endocrine: Negative.   Genitourinary: Negative.  Negative for difficulty urinating.  Musculoskeletal: Negative.  Negative for back pain, myalgias and neck pain.  Skin: Negative.  Negative for color change and rash.  Allergic/Immunologic: Negative.   Neurological: Positive for dizziness. Negative for tremors, seizures, syncope, facial asymmetry, speech difficulty, weakness, light-headedness, numbness and headaches.  Hematological: Negative for adenopathy.  Psychiatric/Behavioral: Negative.     Objective:  BP 136/86 (BP Location: Left Arm, Patient Position: Sitting, Cuff Size: Normal)   Pulse 79   Temp 97.9 F (36.6 C) (Oral)   Resp 16   Ht 5' 5.5" (1.664 m)   Wt 133 lb (60.3 kg)   SpO2 97%  BMI 21.80 kg/m   BP Readings from Last 3 Encounters:  04/13/17 136/86  03/18/17 120/68  10/29/16 110/60    Wt Readings from Last 3 Encounters:  04/13/17 133 lb (60.3 kg)  03/18/17 131 lb (59.4 kg)  10/29/16 138 lb 6.4 oz (62.8 kg)    Physical Exam  Constitutional: No distress.  HENT:  Right Ear: Hearing, tympanic membrane, external ear and ear canal normal.  Left Ear: Hearing, tympanic membrane,  external ear and ear canal normal.  Eyes: Conjunctivae and EOM are normal. Pupils are equal, round, and reactive to light. Right eye exhibits no discharge. Left eye exhibits no discharge. No scleral icterus.  Neck: Normal range of motion. Neck supple. No JVD present. No thyromegaly present.  Cardiovascular: Normal rate, regular rhythm and intact distal pulses.  Exam reveals no gallop.   No murmur heard. Pulmonary/Chest: Effort normal and breath sounds normal. No respiratory distress. She has no wheezes. She has no rales. She exhibits no tenderness.  Abdominal: Soft. Bowel sounds are normal. She exhibits no distension and no mass. There is no tenderness. There is no rebound and no guarding.  Musculoskeletal: Normal range of motion. She exhibits no edema, tenderness or deformity.  Lymphadenopathy:    She has no cervical adenopathy.  Neurological: She displays no atrophy and no tremor. No cranial nerve deficit or sensory deficit. She exhibits normal muscle tone. She displays a negative Romberg sign. She displays no seizure activity. Coordination and gait normal. She displays no Babinski's sign on the right side.  Reflex Scores:      Tricep reflexes are 0 on the right side and 0 on the left side.      Bicep reflexes are 0 on the right side and 0 on the left side.      Brachioradialis reflexes are 0 on the right side and 0 on the left side.      Patellar reflexes are 1+ on the right side and 1+ on the left side.      Achilles reflexes are 0 on the right side and 0 on the left side. Skin: Skin is warm and dry. No rash noted. She is not diaphoretic. No erythema. No pallor.  Psychiatric: She has a normal mood and affect. Her behavior is normal. Judgment and thought content normal.  Vitals reviewed.   Lab Results  Component Value Date   WBC 8.0 03/18/2017   HGB 15.4 (H) 03/18/2017   HCT 45.5 03/18/2017   PLT 425.0 (H) 03/18/2017   GLUCOSE 98 05/01/2016   CHOL 188 05/01/2016   TRIG 120.0  05/01/2016   HDL 60.70 05/01/2016   LDLDIRECT 133.7 01/08/2010   LDLCALC 103 (H) 05/01/2016   ALT 15 05/01/2016   AST 20 05/01/2016   NA 139 05/01/2016   K 4.0 05/01/2016   CL 102 05/01/2016   CREATININE 0.66 05/01/2016   BUN 21 05/01/2016   CO2 30 05/01/2016   TSH 2.04 04/13/2017   HGBA1C 5.5 02/07/2013    Dg Chest 2 View  Result Date: 02/02/2016 CLINICAL DATA:  Nausea. Chest pain. Sternal pressure. Personal history of mitral valve prolapse. EXAM: CHEST - 2 VIEW COMPARISON:  None. FINDINGS: The heart size and mediastinal contours are within normal limits. Both lungs are clear. The visualized skeletal structures are unremarkable. IMPRESSION: Negative two view chest x-ray Electronically Signed   By: San Morelle M.D.   On: 02/02/2016 20:14   US Abdomen Limited Ruq  Result Date: 02/02/2016 CLINICAL DATA:  Epigastric abdominal pain  since this morning. EXAM: US ABDOMEN LIMITED - RIGHT UPPER QUADRANT COMPARISON:  None. FINDINGS: Gallbladder: Physiologically distended. No gallstones or wall thickening visualized. No sonographic Murphy sign noted by sonographer. Common bile duct: Diameter: 4 mm Liver: Lobular well-defined primarily echogenic lesion in the left lobe measures 1.3 x 0.9 x 1.4 cm. Otherwise within normal limits in parenchymal echogenicity. Normal directional flow in the main portal vein. IMPRESSION: 1. Normal sonographic appearance of the gallbladder and biliary tree. 2. Well-defined 1.4 cm hyperechoic lesion in the left lobe of the liver, likely a hemangioma. If there is history of malignancy, MRI characterization recommended, in the absence of malignancy history, recommend follow-up ultrasound in 6 months to confirm stability. Electronically Signed   By: Jeb Levering M.D.   On: 02/02/2016 21:29    Assessment & Plan:   Debbie Hogan was seen today for blurred vision.  Diagnoses and all orders for this visit:  Essential hypertension- her blood pressure is adequately well  controlled, electrolytes and renal function are normal. -     Comprehensive metabolic panel; Future -     Thyroid Panel With TSH; Future  Elevated platelet count- I will recheck her platelet count and will advise further. 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 I have also ordered an MRI of the brain to screen for lesions of the optic nerve, demyelinating disease, CVA, tumors. -     Sedimentation rate; Future -     MR BRAIN WO CONTRAST; Future  Tinnitus aurium, left- I've ordered an MRI of the brain to see if there is a lesion on her acoustic nerve that explains her symptoms. -     MR BRAIN WO CONTRAST; Future   I am having Debbie Hogan maintain her calcium citrate-vitamin D, estradiol, progesterone, losartan, LORazepam, benzonatate, buPROPion, simvastatin, predniSONE, PROAIR HFA, fluticasone, doxycycline, and Fluticasone-Salmeterol.  No orders of the defined types were placed in this encounter.    Follow-up: Return in about 4 weeks (around 05/11/2017).  Scarlette Calico, MD

## 2017-04-13 NOTE — Patient Instructions (Signed)

## 2017-04-14 LAB — THYROID PANEL WITH TSH
FREE THYROXINE INDEX: 1.9 (ref 1.4–3.8)
T3 Uptake: 32 % (ref 22–35)
T4, Total: 5.9 ug/dL (ref 4.5–12.0)
TSH: 2.04 m[IU]/L

## 2017-04-14 LAB — COMPREHENSIVE METABOLIC PANEL
ALT: 34 U/L (ref 0–35)
AST: 38 U/L — ABNORMAL HIGH (ref 0–37)
Albumin: 4.6 g/dL (ref 3.5–5.2)
Alkaline Phosphatase: 42 U/L (ref 39–117)
BILIRUBIN TOTAL: 0.4 mg/dL (ref 0.2–1.2)
BUN: 15 mg/dL (ref 6–23)
CALCIUM: 9.7 mg/dL (ref 8.4–10.5)
CHLORIDE: 101 meq/L (ref 96–112)
CO2: 32 meq/L (ref 19–32)
Creatinine, Ser: 0.66 mg/dL (ref 0.40–1.20)
GFR: 95.39 mL/min (ref >60.00)
GLUCOSE: 96 mg/dL (ref 70–99)
Potassium: 4.5 mEq/L (ref 3.5–5.1)
Sodium: 138 mEq/L (ref 135–145)
Total Protein: 7.2 g/dL (ref 6.0–8.3)

## 2017-04-16 LAB — METHYLMALONIC ACID, SERUM: METHYLMALONIC ACID, QUANT: 58 nmol/L — AB (ref 87–318)

## 2017-04-26 ENCOUNTER — Other Ambulatory Visit: Payer: Self-pay

## 2017-04-30 ENCOUNTER — Ambulatory Visit
Admission: RE | Admit: 2017-04-30 | Discharge: 2017-04-30 | Disposition: A | Discharge status: Home / Self Care | Payer: Medicare Other | Source: Ambulatory Visit | Attending: Internal Medicine | Admitting: Internal Medicine

## 2017-04-30 ENCOUNTER — Encounter: Payer: Self-pay | Admitting: Internal Medicine

## 2017-04-30 ENCOUNTER — Other Ambulatory Visit: Payer: Self-pay | Admitting: Internal Medicine

## 2017-04-30 DIAGNOSIS — IMO0001 Reserved for inherently not codable concepts without codable children: Secondary | ICD-10-CM

## 2017-04-30 DIAGNOSIS — H538 Other visual disturbances: Secondary | ICD-10-CM

## 2017-04-30 DIAGNOSIS — R51 Headache: Secondary | ICD-10-CM | POA: Diagnosis not present

## 2017-04-30 DIAGNOSIS — R930 Abnormal findings on diagnostic imaging of skull and head, not elsewhere classified: Secondary | ICD-10-CM

## 2017-04-30 DIAGNOSIS — H9312 Tinnitus, left ear: Secondary | ICD-10-CM

## 2017-04-30 DIAGNOSIS — R42 Dizziness and giddiness: Secondary | ICD-10-CM | POA: Diagnosis not present

## 2017-05-05 ENCOUNTER — Encounter: Payer: Self-pay | Admitting: Neurology

## 2017-05-19 ENCOUNTER — Encounter: Payer: Self-pay | Admitting: Internal Medicine

## 2017-05-19 ENCOUNTER — Ambulatory Visit (INDEPENDENT_AMBULATORY_CARE_PROVIDER_SITE_OTHER): Payer: Medicare Other | Admitting: Internal Medicine

## 2017-05-19 VITALS — BP 138/84 | HR 100 | Temp 98.5°F | Resp 16 | Ht 65.5 in | Wt 134.5 lb

## 2017-05-19 DIAGNOSIS — Z0000 Encounter for general adult medical examination without abnormal findings: Secondary | ICD-10-CM | POA: Diagnosis not present

## 2017-05-19 DIAGNOSIS — I10 Essential (primary) hypertension: Secondary | ICD-10-CM

## 2017-05-19 DIAGNOSIS — E785 Hyperlipidemia, unspecified: Secondary | ICD-10-CM

## 2017-05-19 DIAGNOSIS — E2839 Other primary ovarian failure: Secondary | ICD-10-CM | POA: Diagnosis not present

## 2017-05-19 MED ORDER — ATORVASTATIN CALCIUM 20 MG PO TABS: 20.0000 mg | ORAL_TABLET | Freq: Every day | ORAL | 3 refills | Status: DC

## 2017-05-19 MED ORDER — ZOSTER VAC RECOMB ADJUVANTED 50 MCG/0.5ML IM SUSR: 0.5000 mL | Freq: Once | INTRAMUSCULAR | 1 refills | Status: AC

## 2017-05-19 NOTE — Progress Notes (Signed)
Subjective:  Patient ID: Debbie Hogan, female    DOB: 1950/12/20  Age: 66 y.o. MRN: [id number]  CC: Hyperlipidemia; Hypertension; and Annual Exam   HPI Kayleena Eke presents for a CPX.  She has read reports about drug interactions between simvastatin and other medications so she wants to change to a different statin.  She tells me the dizziness and vertigo has resolved. She is concerned about the abnormal MRI result and agrees to see neurology. She has not recently had any migraines but does complain of occasional squiggly vision.  Past Medical History:  Diagnosis Date  . 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 Anxiety    PMH of  . Colonic polyp 2008  . DVT (deep venous thrombosis) ([place])    LLE 2007 (post trauma and prolonged air travel), Lovenox x 6 months  . Hyperlipemia    see NMR data  . Hypertension   . Kidney stone    X1; spontaneous passgae  . Migraine   . MVP (mitral valve prolapse)    Dr.Brackbill;Cardiology   Past Surgical History:  Procedure Laterality Date  . COLONOSCOPY W/ POLYPECTOMY  2009   Dr Earle Gell ,[facility]  . DILATION AND CURETTAGE OF UTERUS  2014   neg pathology  . NASAL SINUS SURGERY  2009   polyps;Dr.Rosen  . OOPHORECTOMY     3  benign cysts  . TONSILLECTOMY      reports that she quit smoking about 36 years ago. She has never used smokeless tobacco. She reports that she drinks about 3.0 oz of alcohol per week . She reports that she does not use drugs. family history includes Alcohol abuse in her father, sister, sister, and sister; Alzheimer's disease in her paternal uncle; Asthma in her father; Dementia in her father; Depression in her father, mother, and sister; Hypertension in her mother and sister; Hypotension in her father; Ovarian cancer in her maternal aunt. Allergies  Allergen Reactions  . Kiwi Extract Other (See Comments)    Other Reaction: Other reaction  . Other Other (See Comments)   Uncoded Allergy. Allergen: CANTALOPE, Other Reaction: GI Upset Uncoded Allergy. Allergen: bananas, Other Reaction: GI Upset Uncoded Allergy. Allergen: honey dew, Other Reaction: GI Upset  . Montelukast Sodium Palpitations    Irregular heart beat    Outpatient Medications Prior to Visit  Medication Sig Dispense Refill  . buPROPion (WELLBUTRIN XL) 300 MG 24 hr tablet TAKE 1 TABLET DAILY 90 tablet 0  . calcium citrate-vitamin D (CITRACAL+D) 315-200 MG-UNIT per tablet Take 1 tablet by mouth every other day.     . estradiol (VIVELLE-DOT) 0.0375 MG/24HR Place 1 patch onto the skin 2 (two) times a week. Place on Monday and Thursday    . fluticasone (FLONASE) 50 MCG/ACT nasal spray USE 2 SPRAYS IN EACH NOSTRIL DAILY 48 g 1  . Fluticasone-Salmeterol (ADVAIR DISKUS) 100-50 MCG/DOSE AEPB Inhale 1 puff into the lungs 2 (two) times daily as needed. 3 each 3  . losartan (COZAAR) 100 MG tablet Take 0.5 tablets (50 mg total) by mouth daily. 90 tablet 1  . PROAIR HFA 108 (90 Base) MCG/ACT inhaler USE 2 INHALATIONS INTO THE LUNGS  EVERY 6 HOURS AS NEEDED FOR WHEEZING OR SHORTNESS OF BREATH 25.5 g 1  . progesterone (PROMETRIUM) 200 MG capsule Take 1 capsule (200 mg total) by mouth daily. Take for 12 days every 3rd month 30 capsule 0  . LORazepam (ATIVAN) 0.5 MG tablet Take 1 tablet (0.5 mg total)  by mouth every 8 (eight) hours as needed for anxiety. 30 tablet 5  . simvastatin (ZOCOR) 40 MG tablet TAKE 1 TABLET AT BEDTIME 90 tablet 0  . benzonatate (TESSALON) 200 MG capsule Take 1 capsule (200 mg total) by mouth 3 (three) times daily as needed for cough. 30 capsule 3  . doxycycline (VIBRA-TABS) 100 MG tablet AT THE START OF THERAPY TAKE 2 TABLETS TODAY THEN ONE DAILY THEREAFTER 8 tablet 5  . predniSONE (DELTASONE) 20 MG tablet Take 1 tablet (20 mg total) by mouth daily with breakfast. 5 tablet 0   No facility-administered medications prior to visit.     ROS Review of Systems  Constitutional: Negative.   Negative for appetite change, diaphoresis and fatigue.  HENT: Negative.   Eyes: Positive for visual disturbance. Negative for photophobia.  Respiratory: Negative.  Negative for cough, chest tightness, shortness of breath and wheezing.   Cardiovascular: Negative for chest pain, palpitations and leg swelling.  Gastrointestinal: Negative for abdominal pain, constipation, diarrhea, nausea and vomiting.  Endocrine: Negative.   Genitourinary: Negative.  Negative for difficulty urinating.  Musculoskeletal: Negative.  Negative for back pain, myalgias and neck pain.  Skin: Negative.  Negative for color change.  Allergic/Immunologic: Negative.   Neurological: Negative.   Hematological: Negative for adenopathy. Does not bruise/bleed easily.  Psychiatric/Behavioral: Negative.     Objective:  BP 138/84 (BP Location: Left Arm, Patient Position: Sitting, Cuff Size: Normal)   Pulse 100   Temp 98.5 F (36.9 C) (Oral)   Resp 16   Ht 5' 5.5" (1.664 m)   Wt 134 lb 8 oz (61 kg)   SpO2 98%   BMI 22.04 kg/m   BP Readings from Last 3 Encounters:  05/19/17 138/84  04/13/17 136/86  03/18/17 120/68    Wt Readings from Last 3 Encounters:  05/19/17 134 lb 8 oz (61 kg)  04/13/17 133 lb (60.3 kg)  03/18/17 131 lb (59.4 kg)    Physical Exam  Constitutional: She is oriented to person, place, and time. No distress.  HENT:  Mouth/Throat: Oropharynx is clear and moist. No oropharyngeal exudate.  Eyes: Conjunctivae are normal. Right eye exhibits no discharge. Left eye exhibits no discharge. No scleral icterus.  Neck: Normal range of motion. Neck supple. No thyromegaly present.  Cardiovascular: Normal rate, regular rhythm and intact distal pulses.  Exam reveals no gallop and no friction rub.   No murmur heard. Pulmonary/Chest: Effort normal and breath sounds normal. No respiratory distress. She has no wheezes. She has no rales. She exhibits no tenderness.  Abdominal: Soft. Bowel sounds are normal. She  exhibits no distension and no mass. There is no tenderness. There is no rebound and no guarding.  Musculoskeletal: Normal range of motion. She exhibits no edema, tenderness or deformity.  Lymphadenopathy:    She has no cervical adenopathy.  Neurological: She is alert and oriented to person, place, and time.  Skin: Skin is warm and dry. No rash noted. She is not diaphoretic. No erythema. No pallor.  Psychiatric: She has a normal mood and affect. Her behavior is normal. Judgment and thought content normal.  Vitals reviewed.   Lab Results  Component Value Date   WBC 8.5 05/20/2017   HGB 14.8 05/20/2017   HCT 44.3 05/20/2017   PLT 360.0 05/20/2017   GLUCOSE 96 04/13/2017   CHOL 172 05/20/2017   TRIG 60.0 05/20/2017   HDL 73.60 05/20/2017   LDLDIRECT 133.7 01/08/2010   LDLCALC 86 05/20/2017   ALT 34 04/13/2017  AST 38 (H) 04/13/2017   NA 138 04/13/2017   K 4.5 04/13/2017   CL 101 04/13/2017   CREATININE 0.66 04/13/2017   BUN 15 04/13/2017   CO2 32 04/13/2017   TSH 2.04 04/13/2017   HGBA1C 5.5 02/07/2013    Visual Acuity Screening   Right eye Left eye Both eyes  Without correction: 20/30 20/25 20/25   With correction:     Hearing Screening Comments: Patient passed whisper test x 3   Mr Brain Wo Contrast  Result Date: 04/30/2017 CLINICAL DATA:  Vertigo of central origin in the left ear. Blurred vision bilaterally. Left-sided tinnitus. Migraine headaches, most severe while riding in a car. EXAM: MRI HEAD WITHOUT CONTRAST TECHNIQUE: Multiplanar, multiecho pulse sequences of the brain and surrounding structures were obtained without intravenous contrast. COMPARISON:  None. FINDINGS: Brain: Mild periventricular and scattered subcortical T2 hyperintensities are slightly greater than expected for age. No acute infarct, hemorrhage, or mass lesion is present. Internal auditory canals are within normal limits bilaterally. The brainstem and cerebellum are normal. 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 Vascular: Flow is present in the major intracranial arteries. Skull and upper cervical spine: The skullbase is within normal limits. Craniocervical junction is within normal limits. The upper cervical spine is within normal limits. Midline sagittal structures are unremarkable. Sinuses/Orbits: Small fluid levels are present in the maxillary sinuses bilaterally. Mucosal thickening is present along the floor both maxillary sinuses. There is a polyp or mucous retention cyst at the floor of the right maxillary sinus. This could be related to the teeth. The globes and orbits are within normal limits. IMPRESSION: 1. Periventricular and subcortical T2 changes bilaterally are mildly advanced for age. The finding is nonspecific but can be seen in the setting of chronic microvascular ischemia, a demyelinating process such as multiple sclerosis, vasculitis, complicated migraine headaches, or as the sequelae of a prior infectious or inflammatory process. 2. T2 hyperintensities in the left cerebellum suggest remote lacunar infarcts. This raises concern for a vascular etiology, most likely atherosclerotic disease. Electronically Signed   By: San Morelle M.D.   On: 04/30/2017 18:08    Assessment & Plan:   Lailani was seen today for hyperlipidemia, hypertension and annual exam.  Diagnoses and all orders for this visit:  Hyperlipidemia with target LDL less than 130- she has achieved her LDL goal, will change the statin at her request -     atorvastatin (LIPITOR) 20 MG tablet; Take 1 tablet (20 mg total) by mouth daily. -     Lipid panel; Future  Essential hypertension- her BP is well controlled -     CBC with Differential/Platelet; Future  Estrogen deficiency- she is due for a DEXA scan -     DG Bone Density; Future  Routine general medical examination at a health care facility -     Zoster Vac Recomb Adjuvanted [facility]) injection; Inject 0.5 mLs into the muscle  once.   I have discontinued Ms. Desmith LORazepam, benzonatate, simvastatin, predniSONE, and doxycycline. I am also having her start on atorvastatin and Zoster Vac Recomb Adjuvanted. Additionally, I am having her maintain her calcium citrate-vitamin D, estradiol, progesterone, losartan, buPROPion, PROAIR HFA, fluticasone, and Fluticasone-Salmeterol.  Meds ordered this encounter  Medications  . atorvastatin (LIPITOR) 20 MG tablet    Sig: Take 1 tablet (20 mg total) by mouth daily.    Dispense:  90 tablet    Refill:  3  . Zoster Vac Recomb Adjuvanted [facility]) injection  Sig: Inject 0.5 mLs into the muscle once.    Dispense:  1 each    Refill:  1   See AVS for instructions about healthy living and anticipatory guidance.  Follow-up: Return in about 6 months (around 11/19/2017).  Scarlette Calico, MD

## 2017-05-19 NOTE — Patient Instructions (Signed)

## 2017-05-20 ENCOUNTER — Encounter: Payer: Self-pay | Admitting: Internal Medicine

## 2017-05-20 ENCOUNTER — Other Ambulatory Visit (INDEPENDENT_AMBULATORY_CARE_PROVIDER_SITE_OTHER): Payer: Medicare Other

## 2017-05-20 DIAGNOSIS — I10 Essential (primary) hypertension: Secondary | ICD-10-CM

## 2017-05-20 DIAGNOSIS — E785 Hyperlipidemia, unspecified: Secondary | ICD-10-CM

## 2017-05-20 LAB — CBC WITH DIFFERENTIAL/PLATELET
BASOS ABS: 0.2 10*3/uL — AB (ref 0.0–0.1)
Basophils Relative: 2.3 % (ref 0.0–3.0)
EOS ABS: 2.1 10*3/uL — AB (ref 0.0–0.7)
Eosinophils Relative: 24.8 % — ABNORMAL HIGH (ref 0.0–5.0)
HEMATOCRIT: 44.3 % (ref 36.0–46.0)
HEMOGLOBIN: 14.8 g/dL (ref 12.0–15.0)
LYMPHS PCT: 28 % (ref 12.0–46.0)
Lymphs Abs: 2.4 10*3/uL (ref 0.7–4.0)
MCHC: 33.5 g/dL (ref 30.0–36.0)
MCV: 92.7 fl (ref 78.0–100.0)
MONO ABS: 0.5 10*3/uL (ref 0.1–1.0)
Monocytes Relative: 5.5 % (ref 3.0–12.0)
Neutro Abs: 3.3 10*3/uL (ref 1.4–7.7)
Neutrophils Relative %: 39.4 % — ABNORMAL LOW (ref 43.0–77.0)
PLATELETS: 360 10*3/uL (ref 150.0–400.0)
RBC: 4.77 Mil/uL (ref 3.87–5.11)
RDW: 13.7 % (ref 11.5–15.5)
WBC: 8.5 10*3/uL (ref 4.0–10.5)

## 2017-05-20 LAB — LIPID PANEL
CHOL/HDL RATIO: 2
CHOLESTEROL: 172 mg/dL (ref 0–200)
HDL: 73.6 mg/dL (ref >39.00)
LDL CALC: 86 mg/dL (ref 0–99)
NonHDL: 98.23
Triglycerides: 60 mg/dL (ref 0.0–149.0)
VLDL: 12 mg/dL (ref 0.0–40.0)

## 2017-05-23 NOTE — Assessment & Plan Note (Signed)

## 2017-06-11 DIAGNOSIS — H524 Presbyopia: Secondary | ICD-10-CM | POA: Diagnosis not present

## 2017-06-11 DIAGNOSIS — H532 Diplopia: Secondary | ICD-10-CM | POA: Diagnosis not present

## 2017-07-27 ENCOUNTER — Other Ambulatory Visit: Payer: Self-pay | Admitting: Internal Medicine

## 2017-08-11 ENCOUNTER — Telehealth: Payer: Self-pay

## 2017-08-11 ENCOUNTER — Ambulatory Visit (INDEPENDENT_AMBULATORY_CARE_PROVIDER_SITE_OTHER): Payer: Medicare Other | Admitting: Neurology

## 2017-08-11 ENCOUNTER — Other Ambulatory Visit: Payer: Self-pay | Admitting: Family Medicine

## 2017-08-11 ENCOUNTER — Encounter: Payer: Self-pay | Admitting: Neurology

## 2017-08-11 ENCOUNTER — Other Ambulatory Visit: Payer: Medicare Other

## 2017-08-11 VITALS — BP 126/78 | HR 88 | Ht 65.5 in | Wt 131.2 lb

## 2017-08-11 DIAGNOSIS — Z86718 Personal history of other venous thrombosis and embolism: Secondary | ICD-10-CM | POA: Diagnosis not present

## 2017-08-11 DIAGNOSIS — H8112 Benign paroxysmal vertigo, left ear: Secondary | ICD-10-CM

## 2017-08-11 DIAGNOSIS — H532 Diplopia: Secondary | ICD-10-CM

## 2017-08-11 DIAGNOSIS — M79605 Pain in left leg: Secondary | ICD-10-CM | POA: Diagnosis not present

## 2017-08-11 NOTE — Progress Notes (Signed)
NEUROLOGY CONSULTATION NOTE  Omesha Bowerman MRN: [id number] DOB: 1951/01/05  Referring provider: Dr. Ronnald Ramp Primary care provider: Dr. Ronnald Ramp  Reason for consult:  vertigo  HISTORY OF PRESENT ILLNESS: Debbie Hogan is a 66 year old female with hypertension, asthma, hyperlipidemia, former smoker and history of migraines and kidney stone who presents for vertigo.  History supplemented by PCP note.  She has a history of tinnitus but denies hearing loss.  About 6 months ago, she started experiencing episodes of horizontal diplopia which resolved when closing either eye.  It always occurred while driving and would last approximately 30 seconds.  It happened several times.  There is no associated headache, nausea or vomiting.  There is no associated dysarthria, dysphagia, dyspnea, extremity weakness or neck weakness.  She was evaluated by ophthalmologist, Dr. Jola Schmidt, who said that she had a weakness in her right eye.  She has an upcoming follow up appointment.  She also has been experiencing brief episodes of vertigo, described as sensation of fluttering in her left ear followed by severe spinning sensation lasting less than a minute.  There is no associated headache or visual symptoms.  It occurs infrequently.  She is unsure if it is triggered by change in position or head movement.    She has history of migraines since she was a child. She has ocular migraines presenting as recurrent episodes of visual disturbance in which she sees squiggly lines and/or blurred vision lasting about 10 minutes.  Frequency varies.  She may go several years without an episode and then have 4 or 5 spells over several weeks.  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  She does endorse history of panic attacks related to driving since her  husband passed away suddenly due to a heart attack while driving his truck.  05/20/17 Lipid panel:  LDL 86. 04/13/17 LABS:  Sed Rate 6; CMP with Na 138, K 4.5, Cl 101, CO2 32, glucose 96, BUN 15, Cr 0.66, t. bili 0.4, ALP 42, AST 38 and ALT 34.  Of note, she has a history of DVT in her left leg several years ago following a long plane ride.  Over the past 2 months, she reports aching pain in her leg below the knee, similar to the pain when she had a DVT.  She also reports an aching pain in her left buttock radiating down the posterior thigh.  There is no associated weakness or numbness.  PAST MEDICAL HISTORY: Past Medical History:  Diagnosis Date  . 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 Anxiety    PMH of  . Colonic polyp 2008  . DVT (deep venous thrombosis) ([place])    LLE 2007 (post trauma and prolonged air travel), Lovenox x 6 months  . Hyperlipemia    see NMR data  . Hypertension   . Kidney stone    X1; spontaneous passgae  . Migraine   . MVP (mitral valve prolapse)    Dr.Brackbill;Cardiology    PAST SURGICAL HISTORY: Past Surgical History:  Procedure Laterality Date  . COLONOSCOPY W/ POLYPECTOMY  2009   Dr Earle Gell ,[facility]  . DILATION AND CURETTAGE OF UTERUS  2014   neg pathology  . NASAL SINUS SURGERY  2009   polyps;Dr.Rosen  . OOPHORECTOMY     3  benign cysts  . TONSILLECTOMY  MEDICATIONS: Current Outpatient Prescriptions on File Prior to Visit  Medication Sig Dispense Refill  . atorvastatin (LIPITOR) 20 MG tablet Take 1 tablet (20 mg total) by mouth daily. 90 tablet 3  . buPROPion (WELLBUTRIN XL) 300 MG 24 hr tablet Take 1 tablet (300 mg total) by mouth daily. 90 tablet 1  . calcium citrate-vitamin D (CITRACAL+D) 315-200 MG-UNIT per tablet Take 1 tablet by mouth every other day.     . estradiol (VIVELLE-DOT) 0.0375 MG/24HR Place 1 patch onto the skin 2 (two) times a week. Place on Monday and Thursday    . fluticasone  (FLONASE) 50 MCG/ACT nasal spray USE 2 SPRAYS IN EACH NOSTRIL DAILY 48 g 1  . Fluticasone-Salmeterol (ADVAIR DISKUS) 100-50 MCG/DOSE AEPB Inhale 1 puff into the lungs 2 (two) times daily as needed. 3 each 3  . losartan (COZAAR) 100 MG tablet Take 0.5 tablets (50 mg total) by mouth daily. 90 tablet 1  . PROAIR HFA 108 (90 Base) MCG/ACT inhaler USE 2 INHALATIONS INTO THE LUNGS  EVERY 6 HOURS AS NEEDED FOR WHEEZING OR SHORTNESS OF BREATH 25.5 g 1  . progesterone (PROMETRIUM) 200 MG capsule Take 1 capsule (200 mg total) by mouth daily. Take for 12 days every 3rd month 30 capsule 0   No current facility-administered medications on file prior to visit.     ALLERGIES: Allergies  Allergen Reactions  . Kiwi Extract Other (See Comments)    Other Reaction: Other reaction  . Other Other (See Comments)    Uncoded Allergy. Allergen: CANTALOPE, Other Reaction: GI Upset Uncoded Allergy. Allergen: bananas, Other Reaction: GI Upset Uncoded Allergy. Allergen: honey dew, Other Reaction: GI Upset  . Montelukast Sodium Palpitations    Irregular heart beat    FAMILY HISTORY: Family History  Problem Relation Age of Onset  . Dementia Father   . Asthma Father   . Depression Father   . Alcohol abuse Father   . Hypotension Father   . Hypertension Mother        MGM   . Depression Mother   . Hypertension Sister   . Alcohol abuse Sister   . Depression Sister        X 2  . Alcohol abuse Sister   . Alzheimer's disease Paternal Uncle   . Ovarian cancer Maternal Aunt   . Alcohol abuse Sister        X 2  . Diabetes Neg Hx   . Stroke Neg Hx   . Heart attack Neg Hx   . Cancer Neg Hx   . Early death Neg Hx     SOCIAL HISTORY: Social History   Social History  . Marital status: Widowed    Spouse name: N/A  . Number of children: N/A  . Years of education: N/A   Occupational History  . Not on file.   Social History Main Topics  . Smoking status: Former Smoker    Quit date: 10/19/1980  . Smokeless  tobacco: Never Used     Comment: Positive history of passive tobacco smoke exposure until 1990.Smoked [phone], up to 1/2 ppd  . Alcohol use 3.0 oz/week    5 Glasses of wine per week     Comment: Social alcohol  4 / week  . Drug use: No  . Sexual activity: Not Currently   Other Topics Concern  . Not on file   Social History Narrative   widowed    REVIEW OF SYSTEMS: Constitutional: No fevers, chills, or sweats, no generalized fatigue,  change in appetite Eyes: No visual changes, double vision, eye pain Ear, nose and throat: No hearing loss, ear pain, nasal congestion, sore throat Cardiovascular: No chest pain, palpitations Respiratory:  No shortness of breath at rest or with exertion, wheezes GastrointestinaI: No nausea, vomiting, diarrhea, abdominal pain, fecal incontinence Genitourinary:  No dysuria, urinary retention or frequency Musculoskeletal:  No neck pain, back pain Integumentary: No rash, pruritus, skin lesions Neurological: as above Psychiatric: No depression, insomnia, anxiety Endocrine: No palpitations, fatigue, diaphoresis, mood swings, change in appetite, change in weight, increased thirst Hematologic/Lymphatic:  No purpura, petechiae. Allergic/Immunologic: no itchy/runny eyes, nasal congestion, recent allergic reactions, rashes  PHYSICAL EXAM: Vitals:   08/11/17 0750  BP: 126/78  Pulse: 88  SpO2: 98%   General: No acute distress.  Patient appears well-groomed.  Head:  Normocephalic/atraumatic Eyes:  fundi examined but not visualized Neck: supple, no paraspinal tenderness, full range of motion Back: No paraspinal tenderness Heart: regular rate and rhythm Lungs: Clear to auscultation bilaterally. Vascular: No carotid bruits. Neurological Exam: Mental status: alert and oriented to person, place, and time, recent and remote memory intact, fund of knowledge intact, attention and concentration intact, speech fluent and not dysarthric, language intact. Cranial  nerves: CN I: not tested CN II: pupils equal, round and reactive to light, visual fields intact CN III, IV, VI:  full range of motion, no nystagmus, no ptosis CN V: facial sensation intact CN VII: upper and lower face symmetric CN VIII: hearing intact CN IX, X: gag intact, uvula midline CN XI: sternocleidomastoid and trapezius muscles intact CN XII: tongue midline Bulk & Tone: normal, no fasciculations. Motor:  5/5 throughout  Sensation: temperature and vibration sensation intact. Deep Tendon Reflexes:  2+ throughout, toes downgoing.  Finger to nose testing:  Without dysmetria.  Heel to shin:  Without dysmetria.  Gait:  Normal station and stride.  Able to turn and tandem walk. Romberg negative.  IMPRESSION: Transient diplopia while driving.  Benign paroxysmal positional vertigo Left leg pain.  Symptoms may be sciatic but with history of DVT, we will evaluate.  PLAN: 1.  Check myasthenia panel. 2.  Obtain notes from Dr. Valetta Close 3.  Check venous ultrasound of left leg.  If unremarkable, recommend physical therapy. 4.  If vertigo becomes more frequent, consider vestibular rehab. 5.  Obtain notes from ophthalmologist. 6.  Further recommendations pending results.  Otherwise, follow up as needed.  Thank you for allowing me to take part in the care of this patient.  Metta Clines, DO  CC:  Scarlette Calico, MD

## 2017-08-11 NOTE — Patient Instructions (Signed)
1.  I will get notes from Dr. Valetta Close 2.  We will check a myasthenia gravis panel for the double vision 3.  We will check a venous ultrasound of the left lower extremity to evaluate for DVT 4.  Further recommendations pending results.  Otherwise, follow up as needed.

## 2017-08-11 NOTE — Telephone Encounter (Signed)
Called [facility] to request request records Dr Tomi Likens wanted. I was told Pt will need to sign a release, called Pt, she is near there and will stop and sign and have them fax those to [place].

## 2017-08-16 ENCOUNTER — Ambulatory Visit
Admission: RE | Admit: 2017-08-16 | Discharge: 2017-08-16 | Disposition: A | Discharge status: Home / Self Care | Payer: Medicare Other | Source: Ambulatory Visit | Attending: Neurology | Admitting: Neurology

## 2017-08-16 DIAGNOSIS — Z86718 Personal history of other venous thrombosis and embolism: Secondary | ICD-10-CM

## 2017-08-16 DIAGNOSIS — M79605 Pain in left leg: Secondary | ICD-10-CM | POA: Diagnosis not present

## 2017-08-17 LAB — MYASTHENIA GRAVIS PANEL 2
ACETYLCHOL MODUL AB: 16 %{inhibition}
ACHR Blocking Abs: <15 % Inhibition (ref <15)

## 2017-08-19 ENCOUNTER — Telehealth: Payer: Self-pay

## 2017-08-19 NOTE — Telephone Encounter (Signed)
-----   Message from Pieter Partridge, DO sent at 08/18/2017  6:52 AM EDT ----- Myasthenia panel is negative

## 2017-08-19 NOTE — Telephone Encounter (Signed)
LM on VM advising of negative Myasthenia panel and to call with any questions

## 2017-08-24 ENCOUNTER — Telehealth: Payer: Self-pay

## 2017-08-24 NOTE — Telephone Encounter (Signed)
Called Pt to advised of U/S results, LM on VM

## 2017-08-24 NOTE — Telephone Encounter (Signed)
-----   Message from Pieter Partridge, DO sent at 08/17/2017  9:22 AM EDT ----- Ultrasound does not demonstrate blood clot/DVT

## 2017-08-26 IMAGING — MR MR HEAD W/O CM
8 series · 48 of 48 positions shown · non-contrast
Comparison: None.

CLINICAL DATA: Vertigo of central origin in the left ear. Blurred
vision bilaterally. Left-sided tinnitus. Migraine headaches, most
severe while riding in a car.

EXAM:
MRI HEAD WITHOUT CONTRAST
TECHNIQUE: Multiplanar, multiecho pulse sequences of the brain and surrounding
structures were obtained without intravenous contrast.

[Series 5: T1 · sagittal · 4.0mm · 0.75mm/px · 2 of 27 slices shown (1 of 2)]
[im 1/27]
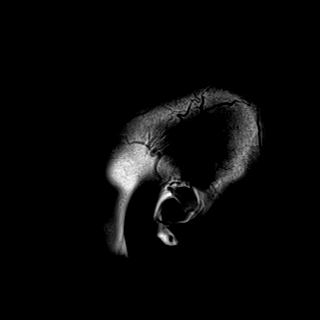
[im 27/27]
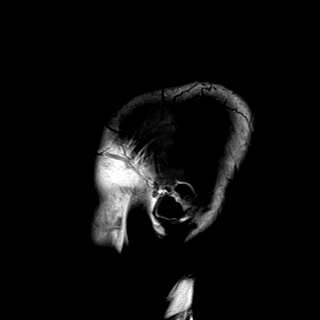

[Series 6: DWI · axial · 3.0mm · 1.44mm/px · z∈[-73,+67]mm · 9 of 88 slices shown (1 of 2)]
[im 1/88]
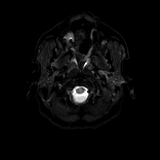
[im 11/88]
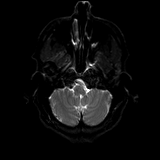
[im 22/88]
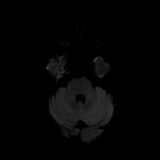
[im 33/88]
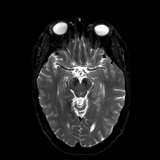
[im 44/88]
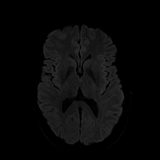
[im 55/88]
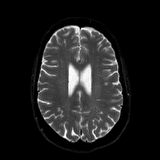
[im 66/88]
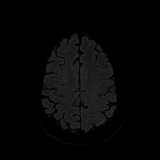
[im 77/88]
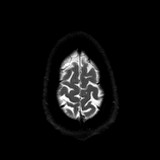
[im 88/88]
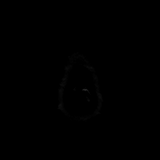

[Series 7: DWI · axial · 3.0mm · 1.44mm/px · z∈[-73,+67]mm · 4 of 43 slices shown (2 of 2)]
[im 1/43]
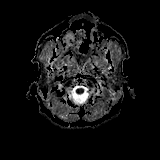
[im 15/43]
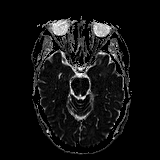
[im 29/43]
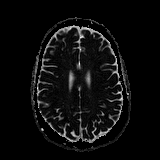
[im 43/43]
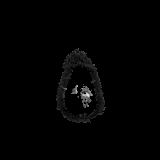

[Series 8: T2 · axial · 4.0mm · 0.36mm/px · z∈[-74,+65]mm · 3 of 28 slices shown (1 of 2)]
[im 1/28]
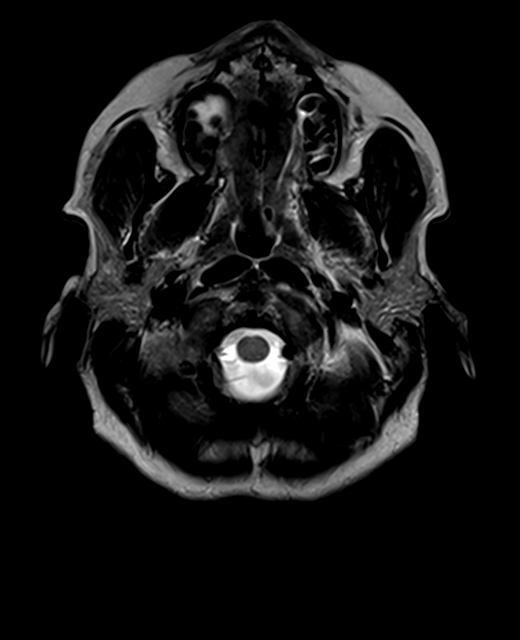
[im 14/28]
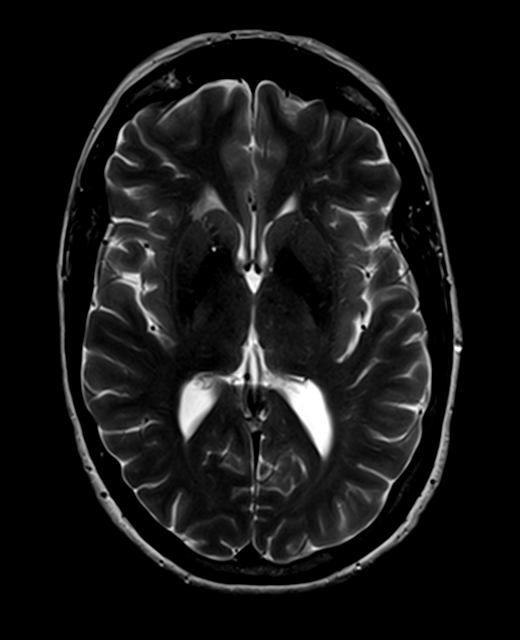
[im 28/28]
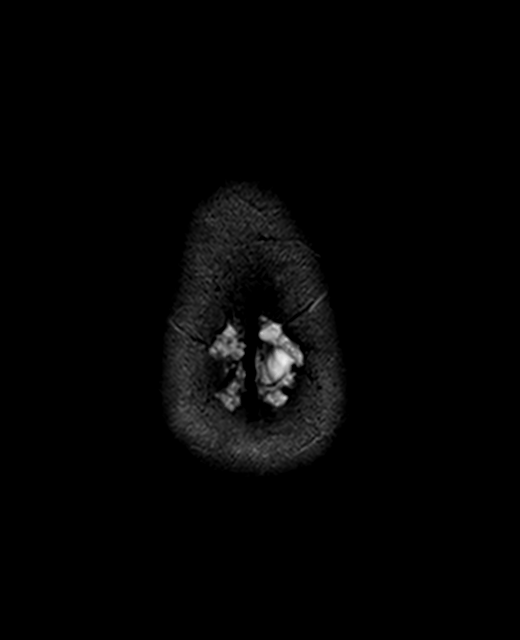

[Series 9: FLAIR · axial · 3.0mm · 0.72mm/px · z∈[-78,+70]mm · 3 of 26 slices shown]
[im 1/26]
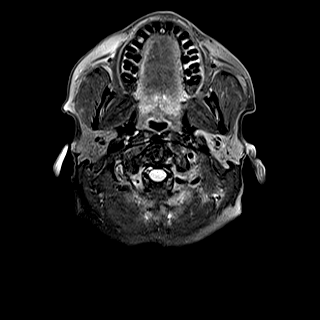
[im 13/26]
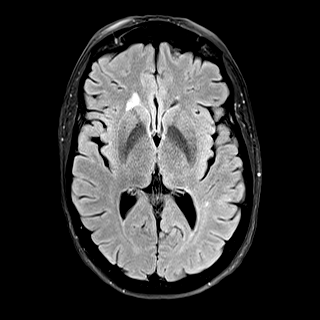
[im 26/26]
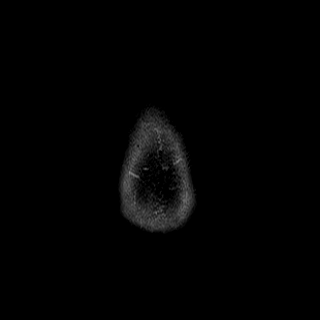

[Series 11: swi_images · axial · 1.5mm · 0.90mm/px · z∈[-75,+66]mm · 10 of 96 slices shown]
[im 1/96]
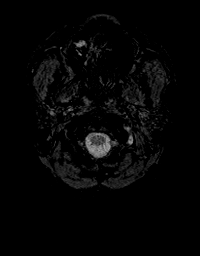
[im 11/96]
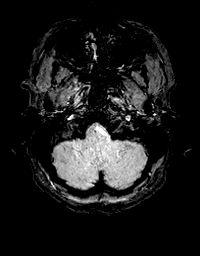
[im 22/96]
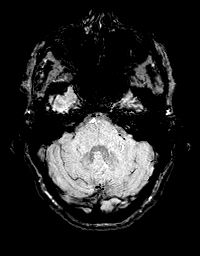
[im 32/96]
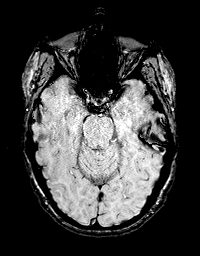
[im 43/96]
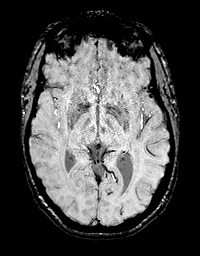
[im 53/96]
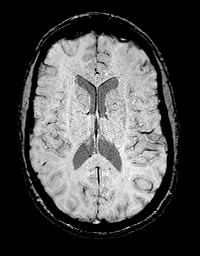
[im 64/96]
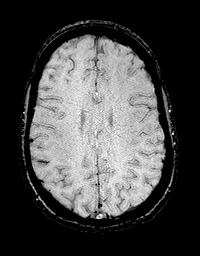
[im 74/96]
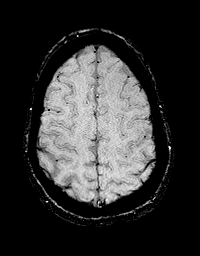
[im 85/96]
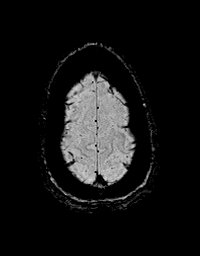
[im 96/96]
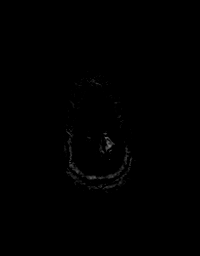

[Series 12: T1 · axial · 1.0mm · 0.90mm/px · z∈[-75,+66]mm · 14 of 144 slices shown (2 of 2)]
[im 1/144]
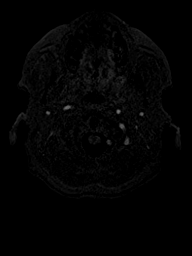
[im 12/144]
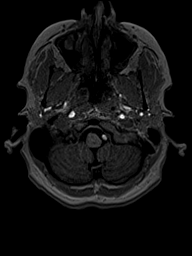
[im 23/144]
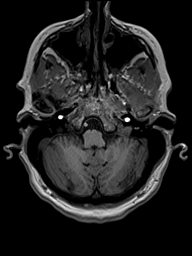
[im 34/144]
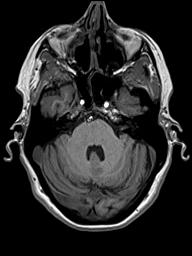
[im 45/144]
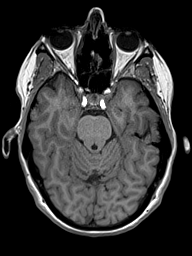
[im 56/144]
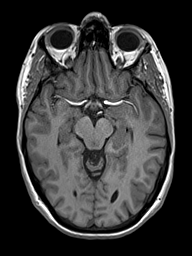
[im 67/144]
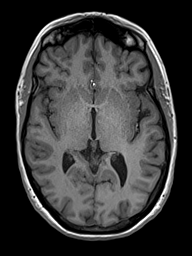
[im 78/144]
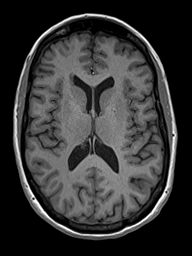
[im 89/144]
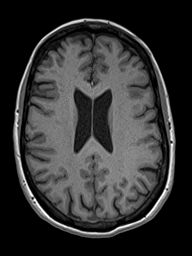
[im 100/144]
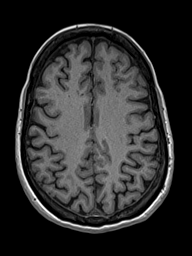
[im 111/144]
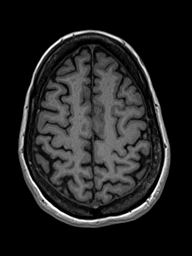
[im 122/144]
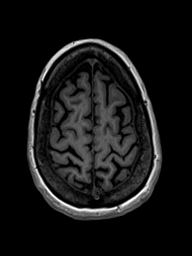
[im 133/144]
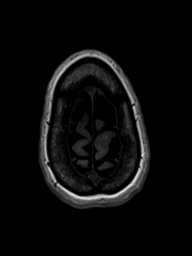
[im 144/144]
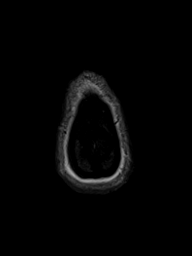

[Series 13: T2 · coronal · 4.5mm · 0.36mm/px · 3 of 30 slices shown (2 of 2)]
[im 1/30]
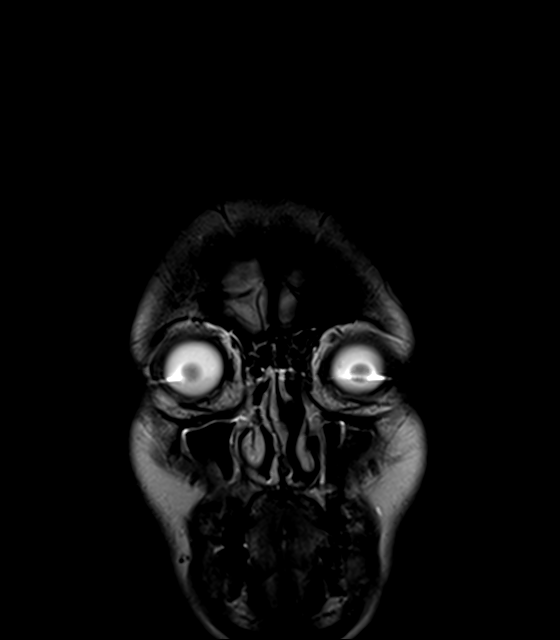
[im 15/30]
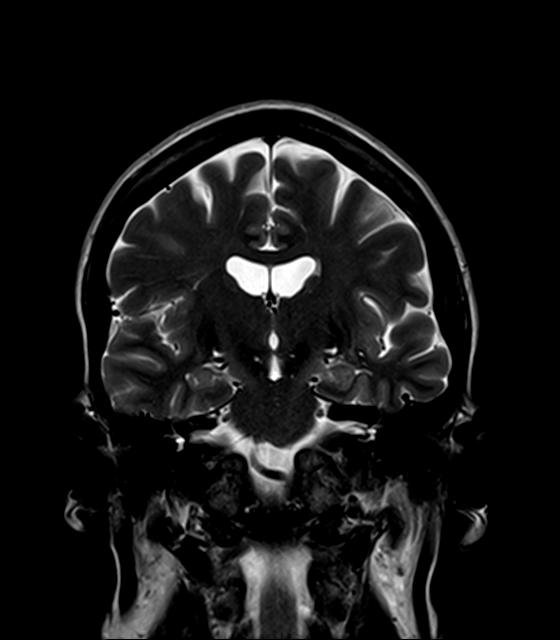
[im 30/30]
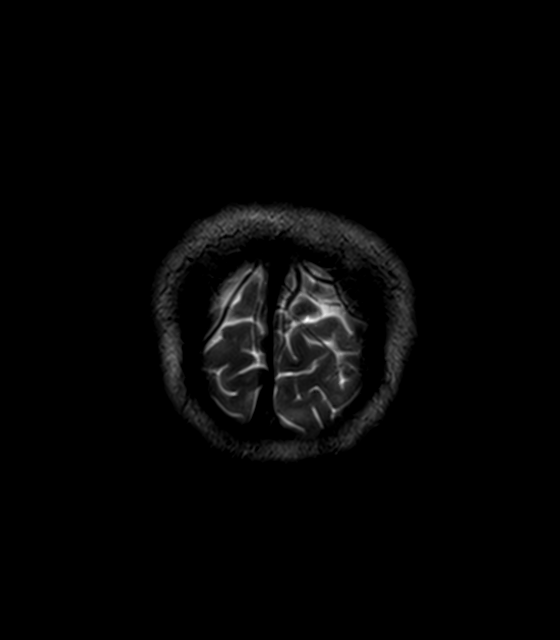

[48 of 48 positions shown; findings below may reference images not displayed]

FINDINGS: Brain: Mild periventricular and scattered subcortical T2
hyperintensities are slightly greater than expected for age. No
acute infarct, hemorrhage, or mass lesion is present. Internal
auditory canals are within normal limits bilaterally. The brainstem
and cerebellum are normal. Remote lacunar infarcts are present
within the left cerebellum.

Vascular: Flow is present in the major intracranial arteries.

Skull and upper cervical spine: The skullbase is within normal
limits. Craniocervical junction is within normal limits. The upper
cervical spine is within normal limits. Midline sagittal structures
are unremarkable.

Sinuses/Orbits: Small fluid levels are present in the maxillary
sinuses bilaterally. Mucosal thickening is present along the floor
both maxillary sinuses. There is a polyp or mucous retention cyst at
the floor of the right maxillary sinus. This could be related to the
teeth. The globes and orbits are within normal limits.
IMPRESSION: 1. Periventricular and subcortical T2 changes bilaterally are mildly
advanced for age. The finding is nonspecific but can be seen in the
setting of chronic microvascular ischemia, a demyelinating process
such as multiple sclerosis, vasculitis, complicated migraine
headaches, or as the sequelae of a prior infectious or inflammatory
process.
2. T2 hyperintensities in the left cerebellum suggest remote lacunar
infarcts. This raises concern for a vascular etiology, most likely
atherosclerotic disease.

## 2017-09-10 DIAGNOSIS — Z23 Encounter for immunization: Secondary | ICD-10-CM | POA: Diagnosis not present

## 2017-09-14 ENCOUNTER — Telehealth: Payer: Self-pay | Admitting: Neurology

## 2017-09-14 DIAGNOSIS — H532 Diplopia: Secondary | ICD-10-CM | POA: Diagnosis not present

## 2017-09-14 NOTE — Telephone Encounter (Signed)
Patient called needing to see if her Labs that were taken recently could be faxed over to Dr. Valetta Close. She said Dr. Valetta Close is requesting them. The fax # is [phone]. Thanks

## 2017-09-14 NOTE — Telephone Encounter (Signed)
Called Pt and advsd her I faxed the labs to Dr Valetta Close,

## 2017-09-21 ENCOUNTER — Encounter: Payer: Self-pay | Admitting: Internal Medicine

## 2017-09-21 ENCOUNTER — Ambulatory Visit (INDEPENDENT_AMBULATORY_CARE_PROVIDER_SITE_OTHER): Payer: Medicare Other | Admitting: Internal Medicine

## 2017-09-21 DIAGNOSIS — D721 Eosinophilia, unspecified: Secondary | ICD-10-CM

## 2017-09-21 DIAGNOSIS — J4530 Mild persistent asthma, uncomplicated: Secondary | ICD-10-CM | POA: Diagnosis not present

## 2017-09-21 MED ORDER — LORAZEPAM 0.5 MG PO TABS: 0.5000 mg | ORAL_TABLET | Freq: Three times a day (TID) | ORAL | 5 refills | Status: DC

## 2017-09-21 NOTE — Progress Notes (Signed)
Subjective:    Patient ID: Debbie Hogan, female    DOB: August 15, 1951, 66 y.o.   MRN: [id number]  HPI F never smoker, followed for asthma, hx DVT, HBP, hx APBA with abnormal cxr. Years ago, presented with lung nodules of retained mucus- Suspected but never proved ABPA. January 28, 2010 after a pneumonia 2 months prior. EOS were 11.6%, down from 28 %.  EOS 05/01/16- 1.4K (H) FENO 03/18/17- 72 (H) Office Spirometry 03/18/17-WNL-FVC 3.21/96%, FEV1 2.34/91%, ratio 0.73, FEF 25-75% 1.81/82%.  -----------------------------------------------------------------------------  03/18/17- 66 year old female former smoker followed for asthma, history DVT, HBP, history ABPA with abnormal CXR, nasal polyps. Originally presented with lung nodules of retained mucus-never proved ABPA Nasal Congestion patient states that some times her ears pop and she gets dizzy .  Was going to try off Advair EOS 05/01/16- 1.4K (H) Persistent increased chest congestion over the past month without fever or sweat. Cough productive white to yellow green with no blood. No adenopathy. Nasal congestion. Occasional vertigo with left ear popping. Doxycycline in March didn't last long enough. Resumed Advair 100. Had a separate brief cold lasting just 2 or 3 days after recent trip to [place] but says that was different. Occasional use of rescue inhaler. FENO 03/18/17- 72 (H) Office Spirometry 03/18/17-WNL-FVC 3.21/96%, FEV1 2.34/91%, ratio 0.73, FEF 25-75% 1.81/82%.  09/21/17- 66 year old female former smoker followed for asthma, history DVT, HBP, history ABPA with abnormal CXR, nasal polyps. Originally presented with lung nodules of retained mucus-never proved ABPA, resected nasal polyps ---asthmatic bronchitis: Pt has slight productive cough-clear in color. Otherwise denies any wheezing or SOB.  Advair100, ProAir, Flonase 1 or 2 asthmatic bronchitis episodes per year, typically with infection.  Feels well controlled continuing Advair 100, 1 puff  daily.  After a few days without this she begins to feel some chest tightness.  Infrequent need for rescue inhaler and no sleep disturbance.  Occasional cough productive of clear mucous plugs. We have provided lorazepam 0.5 mg at her request  for occasional use for anxiety, and occasional episode of doxycycline, over the years.  Otherwise she feels stable.  A recurrent DVT was recently ruled out.  ROS-see HPI    += pos Constitutional:   No-   weight loss, night sweats, fevers, chills, fatigue, lassitude. HEENT:   + headaches, difficulty swallowing, tooth/dental problems, sore throat,       No-  sneezing, itching, ear ache, nasal congestion, post nasal drip,  CV:  No-   chest pain, orthopnea, PND, swelling in lower extremities, anasarca,   +dizziness, palpitations Resp: No-   shortness of breath with exertion or at rest.              +  productive cough,  no- non-productive cough,  No- coughing up of blood.         No- change in color of mucus.  No- wheezing.   Skin: No-   rash or lesions. GI:  No-   heartburn, indigestion, abdominal pain, nausea, vomiting,  GU:  MS:  No-   joint pain or swelling.   Neuro-     nothing unusual Psych:  No- change in mood or affect. No depression or anxiety.  No memory loss.  OBJ- Physical Exam General- Alert, Oriented, Affect-appropriate, Distress- none acute, WDWN Skin- rash-none, lesions- none, excoriation- none Lymphadenopathy- none Head- atraumatic            Eyes- Gross vision intact, PERRLA, conjunctivae and secretions clear  Ears- + bilateral cerumen            Nose-, no-Septal dev, mucus, erosion, perforation             Throat- Mallampati II , mucosa clear , drainage- none, tonsils- atrophic Neck- flexible , trachea midline, no stridor , thyroid nl, carotid no bruit Chest - symmetrical excursion , unlabored           Heart/CV- RRR , no murmur , no gallop  , no rub, nl s1 s2                           - JVD- none , edema- none, stasis  changes- none, varices- none           Lung-clear/unlabored, wheeze- none, cough- none , dullness-none, rub- none           Chest wall-  Abd-  Br/ Gen/ Rectal- Not done, not indicated Extrem- cyanosis- none, clubbing, none, atrophy- none, strength- nl Neuro- grossly intact to observation

## 2017-09-21 NOTE — Assessment & Plan Note (Signed)
She remains well controlled, but does need to maintain Advair.  We discussed available comparisons.  Her [employer] covers without problems so we will continue what she is familiar with.

## 2017-09-21 NOTE — Assessment & Plan Note (Signed)
Years ago with the suspicion was possible ABPA.  Clinical course has been benign but use of a LABA/ICS has remained appropriate and low-dose Advair seems sufficient.  We will continue to track this.

## 2017-09-21 NOTE — Patient Instructions (Addendum)
Ok to continue present meds - please call if we can help  Lorazepam refilled   Please can if I can help

## 2017-10-22 ENCOUNTER — Ambulatory Visit (INDEPENDENT_AMBULATORY_CARE_PROVIDER_SITE_OTHER): Payer: Medicare Other | Admitting: Internal Medicine

## 2017-10-22 ENCOUNTER — Encounter: Payer: Self-pay | Admitting: Internal Medicine

## 2017-10-22 DIAGNOSIS — Z23 Encounter for immunization: Secondary | ICD-10-CM

## 2017-10-22 DIAGNOSIS — Z9189 Other specified personal risk factors, not elsewhere classified: Secondary | ICD-10-CM

## 2017-10-22 DIAGNOSIS — Z7189 Other specified counseling: Secondary | ICD-10-CM

## 2017-10-22 DIAGNOSIS — Z7184 Encounter for health counseling related to travel: Secondary | ICD-10-CM

## 2017-10-22 DIAGNOSIS — Z789 Other specified health status: Secondary | ICD-10-CM | POA: Diagnosis not present

## 2017-10-22 MED ORDER — CIPROFLOXACIN HCL 500 MG PO TABS: 500.0000 mg | ORAL_TABLET | Freq: Two times a day (BID) | ORAL | 0 refills | Status: DC

## 2017-10-22 MED ORDER — ZOLPIDEM TARTRATE 10 MG PO TABS: 10.0000 mg | ORAL_TABLET | Freq: Every evening | ORAL | 0 refills | Status: DC | PRN

## 2017-10-22 MED ORDER — ATOVAQUONE-PROGUANIL HCL 250-100 MG PO TABS: 1.0000 | ORAL_TABLET | Freq: Every day | ORAL | 0 refills | Status: DC

## 2017-10-22 MED ORDER — TYPHOID VI POLYSACCHARIDE VACC 25 MCG/0.5ML IM SOLN: 0.5000 mL | Freq: Once | INTRAMUSCULAR | Status: DC

## 2017-10-22 MED ORDER — ENOXAPARIN SODIUM 60 MG/0.6ML ~~LOC~~ SOLN
60.0000 mg | Freq: Once | SUBCUTANEOUS | 0 refills | Status: DC
Start: 2017-10-22 — End: 2018-06-07

## 2017-10-22 NOTE — Progress Notes (Addendum)
Subjective:   Debbie Hogan is a 67 y.o. female who presents to the Infectious Disease clinic for travel consultation. Planned departure date: January 2019          Planned return date: 2 weeks Countries of travel: [place] Areas in country: urban   Accommodations: hotel Purpose of travel: [place] work Prior travel out of [place]: yes     Objective:   Medications: reviewed    Assessment:   No contraindications to travel. none     Plan:    Issues discussed: environmental concerns, freshwater swimming, future shots, insect-borne illnesses, Japanese encephalitis, jet lag, malaria, motion sickness, MVA safety, rabies, safe food/water, traveler's diarrhea, website/handouts for more information, what to do if ill upon return, what to do if ill while there and Yellow Fever. Immunizations recommended: Typhoid (parenteral). Malaria prophylaxis: malarone, daily dose starting 1-2 days before entering endemic area, ending 7 days after leaving area Traveler's diarrhea prophylaxis: ciprofloxacin. Total duration of visit: 1 Hour. Total time spent on education, counseling, coordination of care: 30 Minutes. She has a history of a DVT following long airplane travel so will use lovenox 1mg /kg x 1 dose 4 hours prior to travel each way.  Letter given for medical necessity.

## 2017-10-22 NOTE — Addendum Note (Signed)
Addended by: Thayer Headings on: 10/22/2017 12:01 PM   Modules accepted: Orders

## 2017-10-22 NOTE — Patient Instructions (Signed)
[facility]                [address] [place]                   [place], [place] [address]                      Phone: [phone]                        Fax: [phone]   Planned departure date: January 2019     Planned return date: 2 weeks Countries of travel: [place]   Guidelines for the Prevention & Treatment of Traveler's Diarrhea  Prevention: "Boil it, Peel it, Lacinda Axon it, or Forget it"   the fewer chances -> lower risk: try to stick to food & water precautions as much as possible"   If it's "piping hot"; it is probably okay, if not, it may not be   Treatment   1) You should always take care to drink lots of fluids in order to avoid dehydration   2) You should bring medications with you in case you come down with a case of diarrhea   3) OTC = bring pepto-bismol - can take with initial abdominal symptoms;                    Imodium - can help slow down your intestinal tract, can help relief cramps                    and diarrhea, can take if no bloody diarrhea  Use ciprofloxacin if needed for traveler's diarrhea  Guidelines for the Prevention of Malaria  Avoidance:  -fewer mosquito bites = lower risk. Mosquitos can bite at night as well as daytime  -cover up (long sleeve clothing), mosquito nets, screens  -Insect repellent for your skin ( DEET containing lotion > 20%): for clothes ( permethrin spray)   2 days prior to travel start malarone, daily dose starting 1-2 days before entering endemic area, ending 7 days after leaving area for malaria prevention. 4 hours prior to flight, take lovenox   Immunizations received today: Typhoid (parenteral)  Future immunizations, if indicated none indicated   Prior to travel:  1) Be sure to pick up appropriate prescriptions, including medicine you take daily. Do not expect to be able to fill your prescriptions abroad.  2) Strongly consider obtaining traveler's insurance,  including emergency evacuation insurance. Most plans in the [place] do not cover participants abroad. (see below for resources)  3) Register at the appropriate [place] embassy or consulate with travel dates so they are aware of your presence in-country and for helpful advice during travel using the [employer] (STEP, GreenNylon.com.cy).  4) Leave contact information with a relative or friend.  5) Keep a Research officer, political party, credit cards in case they become lost or stolen  6) Inform your credit card company that you will be travelling abroad   During travel:  1) If you become ill and need medical advice, the [place] [employer]	[place] of the country you are traveling in general provides a list of [place] speaking doctors.  We are also available on MyChart for remote consultation if you register prior to travel. 2) Avoid motorcycles or scooters when at all possible. Traffic laws in many countries are lax and accidents occur frequently.  3) Do  not take any unnecessary risks that you wouldn't do at home.   Resources:  -Country specific information: BlindResource.ca or GreenNylon.com.cy  -Press photographer (DEET, mosquito nets): [employer], [employer] store, [employer], [place] insurance options: gatewayplans.com; http://clayton-rivera.info/; travelguard.com or [employer], gninsurance.com or info@gninsurance .com, (680) 369-0561.   Post Travel:  If you return from your trip ill, call your primary care doctor or our travel clinic @ [phone].   Enjoy your trip and know that with proper pre-travel preparation, most people have an enjoyable and uninterrupted trip!

## 2017-10-22 NOTE — Addendum Note (Signed)
Addended by: Alberteen Sam on: 10/22/2017 11:55 AM   Modules accepted: Orders

## 2017-10-22 NOTE — Addendum Note (Signed)
Addended by: Alberteen Sam on: 10/22/2017 12:17 PM   Modules accepted: Orders

## 2018-01-24 ENCOUNTER — Other Ambulatory Visit: Payer: Self-pay | Admitting: Internal Medicine

## 2018-02-04 ENCOUNTER — Other Ambulatory Visit: Payer: Self-pay | Admitting: Internal Medicine

## 2018-03-06 ENCOUNTER — Other Ambulatory Visit: Payer: Self-pay

## 2018-03-06 ENCOUNTER — Ambulatory Visit (HOSPITAL_COMMUNITY)
Admission: EM | Admit: 2018-03-06 | Discharge: 2018-03-06 | Disposition: A | Discharge status: Home / Self Care | Payer: Medicare Other | Attending: Family Medicine | Admitting: Family Medicine

## 2018-03-06 ENCOUNTER — Encounter (HOSPITAL_COMMUNITY): Payer: Self-pay

## 2018-03-06 DIAGNOSIS — H6981 Other specified disorders of Eustachian tube, right ear: Secondary | ICD-10-CM | POA: Diagnosis not present

## 2018-03-06 DIAGNOSIS — J3489 Other specified disorders of nose and nasal sinuses: Secondary | ICD-10-CM

## 2018-03-06 DIAGNOSIS — H6591 Unspecified nonsuppurative otitis media, right ear: Secondary | ICD-10-CM

## 2018-03-06 MED ORDER — AMOXICILLIN 500 MG PO CAPS: 1000.0000 mg | ORAL_CAPSULE | Freq: Two times a day (BID) | ORAL | 0 refills | Status: DC

## 2018-03-06 MED ORDER — IPRATROPIUM-ALBUTEROL 0.5-2.5 (3) MG/3ML IN SOLN
3.0000 mL | Freq: Once | RESPIRATORY_TRACT | Status: AC
  Administered 2018-03-06: 3 mL via RESPIRATORY_TRACT

## 2018-03-06 MED ORDER — IPRATROPIUM-ALBUTEROL 0.5-2.5 (3) MG/3ML IN SOLN
RESPIRATORY_TRACT | Status: AC
  Filled 2018-03-06: qty 3

## 2018-03-06 MED ORDER — CETIRIZINE-PSEUDOEPHEDRINE ER 5-120 MG PO TB12: 1.0000 | ORAL_TABLET | Freq: Two times a day (BID) | ORAL | 0 refills | Status: AC

## 2018-03-06 NOTE — ED Triage Notes (Signed)
Patient presents to [facility] for ear and sinus pressure x5 days, pt has taken OTC medication to treat symptoms but has no relief.

## 2018-03-06 NOTE — ED Provider Notes (Signed)
03/06/2018 12:51 PM   DOB: 14-May-1951 / MRN: [id number]  SUBJECTIVE:  Debbie Hogan is a 67 y.o. female presenting for 5 days of right sided ear pain, pressure with radicular pattern to the right maxillary sinus.  No fever, chills, nausea, change in hearing, exudate.  Has had "a cold" prior to this beginning.    Has history of asthma and is not surprised she is wheezing today.  No chest pain, SOB, DOE, leg swelling.   She is allergic to kiwi extract; other; and montelukast sodium.   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    She  reports that she quit smoking about 37 years ago. She has never used smokeless tobacco. She reports that she drinks about 3.0 oz of alcohol per week. She reports that she does not use drugs. She  reports that she does not currently engage in sexual activity. The patient  has a past surgical history that includes Oophorectomy; Nasal sinus surgery (2009); Tonsillectomy; Colonoscopy w/ polypectomy (2009); and Dilation and curettage of uterus (2014).  Her family history includes Alcohol abuse in her father, sister, sister, and sister; Alzheimer's disease in her paternal uncle; Asthma in her father; Dementia in her father; Depression in her father, mother, and sister; Hypertension in her mother and sister; Hypotension in her father; Ovarian cancer in her maternal aunt.  ROS Per HPI.   OBJECTIVE:  BP (!) 142/90 (BP Location: Left Arm)   Pulse 76   Temp 97.6 F (36.4 C) (Oral)   Resp 16   SpO2 98%   Physical Exam  Constitutional: She is oriented to person, place, and time. She appears well-nourished. No distress.  Eyes: Pupils are equal, round, and reactive to light. EOM are normal.  Cardiovascular: Normal rate, regular rhythm, S1 normal, S2 normal, normal heart sounds and  intact distal pulses. Exam reveals no gallop, no friction rub and no decreased pulses.  No murmur heard. Pulmonary/Chest: Effort normal. No stridor. No respiratory distress. She has no wheezes. She has no rales.  Abdominal: She exhibits no distension.  Musculoskeletal: She exhibits no edema.  Neurological: She is alert and oriented to person, place, and time. No cranial nerve deficit. Gait normal.  Skin: Skin is dry. She is not diaphoretic.  Psychiatric: She has a normal mood and affect.  Vitals reviewed.   No results found for this or any previous visit (from the past 72 hour(s)).  No results found.  ASSESSMENT AND PLAN:  No orders of the defined types were placed in this encounter.    Dysfunction of right eustachian tube  Middle ear effusion, right    Discharge Instructions     Try the zyrtec-D for a few days.  Reasons to fill amox are on the prescription paper.  Good luck.        The patient is advised to call or return to clinic if she does not see an improvement in symptoms, or to seek the care of the closest emergency department if she worsens with the above plan.   Philis Fendt, MHS, PA-C 03/06/2018 12:51 PM   Tereasa Coop, PA-C 03/06/18 1252

## 2018-03-06 NOTE — Discharge Instructions (Addendum)
Try the zyrtec-D for a few days.  Reasons to fill amox are on the prescription paper.  Good luck.

## 2018-03-08 ENCOUNTER — Telehealth: Payer: Medicare Other | Admitting: Family

## 2018-03-08 DIAGNOSIS — H9201 Otalgia, right ear: Secondary | ICD-10-CM

## 2018-03-08 NOTE — Progress Notes (Signed)
Based on what you shared with me it looks like you have a serious condition that should be evaluated in a face to face office visit.  NOTE: If you entered your credit card information for this eVisit, you will not be charged. You may see a "hold" on your card for the $30 but that hold will drop off and you will not have a charge processed.  If you are having a true medical emergency please call 911.  If you need an urgent face to face visit, [facility] has four urgent care centers for your convenience.  If you need care fast and have a high deductible or no insurance consider:   [email or web address] to reserve your spot online an avoid wait times  [facility] [address], [address] [place], [place] [address] 8 am to 8 pm Monday-Friday 10 am to 4 pm Saturday-Sunday *Across the street from [employer]  [facility]  [address] [place], [address] 8 am to 5 pm Monday-Friday * In the [facility] the [facility]   The following sites will take your  insurance:  . [facility]  [phone] Get Driving Directions Find a Provider at this Location  [address] [place], [place] [address] . 10 am to 8 pm Monday-Friday . 12 pm to 8 pm Saturday-Sunday   . [facility]  [phone] Get Driving Directions Find a Provider at this Location  [address], [address] [place], [place] [address] . 8 am to 8 pm Monday-Friday . 9 am to 6 pm Saturday . 11 am to 6 pm Sunday   . [facility] [place]  [phone] Get Driving Directions  [address] [place],  [address] . 8 am to 8 pm Monday-Friday . 8 am to 4 pm Saturday-Sunday   Your e-visit answers were reviewed by a board certified advanced clinical practitioner to complete your personal care plan.  Thank you for using e-Visits.  

## 2018-03-10 ENCOUNTER — Encounter: Payer: Self-pay | Admitting: Internal Medicine

## 2018-03-10 ENCOUNTER — Ambulatory Visit (INDEPENDENT_AMBULATORY_CARE_PROVIDER_SITE_OTHER): Payer: Medicare Other | Admitting: Internal Medicine

## 2018-03-10 VITALS — BP 118/76 | HR 86 | Temp 97.8°F | Ht 65.5 in | Wt 134.0 lb

## 2018-03-10 DIAGNOSIS — J302 Other seasonal allergic rhinitis: Secondary | ICD-10-CM

## 2018-03-10 DIAGNOSIS — J3089 Other allergic rhinitis: Secondary | ICD-10-CM | POA: Diagnosis not present

## 2018-03-10 DIAGNOSIS — J4530 Mild persistent asthma, uncomplicated: Secondary | ICD-10-CM | POA: Diagnosis not present

## 2018-03-10 DIAGNOSIS — H9201 Otalgia, right ear: Secondary | ICD-10-CM

## 2018-03-10 MED ORDER — METHYLPREDNISOLONE ACETATE 80 MG/ML IJ SUSP
80.0000 mg | Freq: Once | INTRAMUSCULAR | Status: AC
  Administered 2018-03-10: 80 mg via INTRAMUSCULAR

## 2018-03-10 MED ORDER — PREDNISONE 10 MG PO TABS
ORAL_TABLET | ORAL | 0 refills | Status: DC
Start: 2018-03-10 — End: 2018-06-07

## 2018-03-10 NOTE — Progress Notes (Signed)
Subjective:    Patient ID: Debbie Hogan, female    DOB: [birth year], 67 y.o.   MRN: [id number]  HPI  Here with 9 days onset right ear pain, sharp, very severe for first 2 days (worse than her renal stone and 2 vaginal births in past), seen at [facility] and tx with amoxil and zyrtec D, and takes mucinex on regular basis for chronic lung disease.  Also already taking nasal steroid.  She admits she is improved overall, but still significant enough she felt needed to be seen as the pain was not getting better or at least very slowly.  Pt denies chest pain, increased sob or doe, wheezing, orthopnea, PND, increased LE swelling, palpitations, dizziness or syncope. Past Medical History:  Diagnosis Date  . 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 Anxiety    PMH of  . Colonic polyp 2008  . DVT (deep venous thrombosis) ([place])    LLE 2007 (post trauma and prolonged air travel), Lovenox x 6 months  . Hyperlipemia    see NMR data  . Hypertension   . Kidney stone    X1; spontaneous passgae  . Migraine   . MVP (mitral valve prolapse)    Dr.Brackbill;Cardiology   Past Surgical History:  Procedure Laterality Date  . COLONOSCOPY W/ POLYPECTOMY  2009   Dr Earle Gell ,[facility]  . DILATION AND CURETTAGE OF UTERUS  2014   neg pathology  . NASAL SINUS SURGERY  2009   polyps;Dr.Rosen  . OOPHORECTOMY     3  benign cysts  . TONSILLECTOMY      reports that she quit smoking about 37 years ago. She has never used smokeless tobacco. She reports that she drinks about 3.0 oz of alcohol per week. She reports that she does not use drugs. family history includes Alcohol abuse in her father, sister, sister, and sister; Alzheimer's disease in her paternal uncle; Asthma in her father; Dementia in her father; Depression in her father, mother, and sister; Hypertension in her mother and sister; Hypotension in her father; Ovarian cancer in her maternal aunt. Allergies  Allergen Reactions    . Kiwi Extract Other (See Comments)    Other Reaction: Other reaction  . Other Other (See Comments)    Uncoded Allergy. Allergen: CANTALOPE, Other Reaction: GI Upset Uncoded Allergy. Allergen: bananas, Other Reaction: GI Upset Uncoded Allergy. Allergen: honey dew, Other Reaction: GI Upset  . Montelukast Sodium Palpitations    Irregular heart beat   Current Outpatient Medications on File Prior to Visit  Medication Sig Dispense Refill  . amoxicillin (AMOXIL) 500 MG capsule Take 2 capsules (1,000 mg total) by mouth 2 (two) times daily. Fill if fever, change in hearing, worsening ear pain, or zyrtec-d fails to alleviate your symptoms in 7 days. 40 capsule 0  . atorvastatin (LIPITOR) 20 MG tablet Take 1 tablet (20 mg total) by mouth daily. 90 tablet 3  . atovaquone-proguanil (MALARONE) 250-100 MG TABS tablet Take 1 tablet by mouth daily. Start 2 days prior to travel to malaria area, throughout travel and for 7 days upon return. 23 tablet 0  . atovaquone-proguanil (MALARONE) 250-100 MG TABS tablet Take 1 tablet by mouth daily. Start 2 days prior to travel to malaria area, throughout travel and for 7 days upon return. 23 tablet 0  . buPROPion (WELLBUTRIN XL) 300 MG 24 hr tablet TAKE 1 TABLET DAILY 90 tablet 0  . calcium citrate-vitamin D (CITRACAL+D) 315-200 MG-UNIT per tablet Take 1 tablet  by mouth every other day.     . cetirizine-pseudoephedrine (ZYRTEC-D) 5-120 MG tablet Take 1 tablet by mouth 2 (two) times daily for 7 days. 14 tablet 0  . estradiol (VIVELLE-DOT) 0.0375 MG/24HR Place 1 patch onto the skin 2 (two) times a week. Place on Monday and Thursday    . fluticasone (FLONASE) 50 MCG/ACT nasal spray USE 2 SPRAYS IN EACH NOSTRIL DAILY 48 g 1  . Fluticasone-Salmeterol (ADVAIR DISKUS) 100-50 MCG/DOSE AEPB Inhale 1 puff into the lungs 2 (two) times daily as needed. 3 each 3  . LORazepam (ATIVAN) 0.5 MG tablet Take 1 tablet (0.5 mg total) by mouth every 8 (eight) hours. 30 tablet 5  . losartan  (COZAAR) 100 MG tablet Take 0.5 tablets (50 mg total) by mouth daily. 90 tablet 1  . PROAIR HFA 108 (90 Base) MCG/ACT inhaler USE 2 INHALATIONS INTO THE LUNGS  EVERY 6 HOURS AS NEEDED FOR WHEEZING OR SHORTNESS OF BREATH 25.5 g 1  . progesterone (PROMETRIUM) 200 MG capsule Take 1 capsule (200 mg total) by mouth daily. Take for 12 days every 3rd month 30 capsule 0  . enoxaparin (LOVENOX) 60 MG/0.6ML injection Inject 0.6 mLs (60 mg total) into the skin once for 1 dose. 60 mg 4 hours prior to airplane travel x 2 1.2 mL 0  . zolpidem (AMBIEN) 10 MG tablet Take 1 tablet (10 mg total) by mouth at bedtime as needed for sleep. 10 tablet 0   No current facility-administered medications on file prior to visit.    Review of Systems   Constitutional: Negative for other unusual diaphoresis or sweats HENT: Negative for ear discharge or swelling Eyes: Negative for other worsening visual disturbances Respiratory: Negative for stridor or other swelling  Gastrointestinal: Negative for worsening distension or other blood Genitourinary: Negative for retention or other urinary change Musculoskeletal: Negative for other MSK pain or swelling Skin: Negative for color change or other new lesions Neurological: Negative for worsening tremors and other numbness  Psychiatric/Behavioral: Negative for worsening agitation or other fatigue All otherwise neg per pt    Objective:   Physical Exam BP 118/76   Pulse 86   Temp 97.8 F (36.6 C) (Oral)   Ht 5' 5.5" (1.664 m)   Wt 134 lb (60.8 kg)   SpO2 98%   BMI 21.96 kg/m  VS noted,  Constitutional: Pt appears in NAD HENT: Head: NCAT.  Right Ear: External ear normal.  Left Ear: External ear normal.  Bilat tm's with mild erythema right > left .  Max sinus areas non tender.  Pharynx with mild erythema, no exudate Eyes: . Pupils are equal, round, and reactive to light. Conjunctivae and EOM are normal Nose: without d/c or deformity Neck: Neck supple. Gross normal  ROM Cardiovascular: Normal rate and regular rhythm.   Pulmonary/Chest: Effort normal and breath sounds without rales or wheezing.  Neurological: Pt is alert. At baseline orientation, motor grossly intact Skin: Skin is warm. No rashes, other new lesions, no LE edema Psychiatric: Pt behavior is normal without agitation  No other exam findings    Assessment & Plan:

## 2018-03-10 NOTE — Patient Instructions (Addendum)
You had the steroid shot today  Please take all new medication as prescribed - the prednisone  Please continue all other medications as before, and refills have been done if requested.  Please have the pharmacy call with any other refills you may need  Please keep your appointments with your specialists as you may have planned     

## 2018-03-10 NOTE — Assessment & Plan Note (Signed)
Some improved, and to cont amoxil and all other tx, but add depomedorl IM 80, and predpac asd,   to f/u any worsening symptoms or concerns

## 2018-03-10 NOTE — Assessment & Plan Note (Signed)
stable overall by history and exam, and pt to continue medical treatment as before,  to f/u any worsening symptoms or concerns 

## 2018-03-10 NOTE — Assessment & Plan Note (Signed)
Also to likely improve with above

## 2018-04-06 DIAGNOSIS — N950 Postmenopausal bleeding: Secondary | ICD-10-CM | POA: Diagnosis not present

## 2018-04-07 ENCOUNTER — Other Ambulatory Visit: Payer: Self-pay | Admitting: Internal Medicine

## 2018-04-07 DIAGNOSIS — E785 Hyperlipidemia, unspecified: Secondary | ICD-10-CM

## 2018-04-24 ENCOUNTER — Other Ambulatory Visit: Payer: Self-pay | Admitting: Internal Medicine

## 2018-05-16 ENCOUNTER — Telehealth: Payer: Self-pay | Admitting: Internal Medicine

## 2018-05-16 DIAGNOSIS — H6691 Otitis media, unspecified, right ear: Secondary | ICD-10-CM

## 2018-05-16 NOTE — Telephone Encounter (Signed)
Spoke with patient. She stated that she has been bothered with pain on the right upper side of her mouth as well as her ear since May 2019. She recently went to the dentist and was advised her teeth were ok and that the pain may be sinus related. She denies any drainage from ear as well as no fever.   She wants to know what CY would recommend for her. She uses [facility] in [place] as her pharmacy.   She also wanted to know if she could have a referral to a ENT for this same issue.   CY, please advise. Thanks!

## 2018-05-16 NOTE — Telephone Encounter (Signed)
Spoke with patient. She stated that she has yet to try the Sudafed but will do so now. She is aware to get the 12hr kind. She is also aware of the ENT referral. She wishes to proceed with this. Order has been placed. Nothing further needed at time of call.

## 2018-05-16 NOTE — Telephone Encounter (Signed)
Suggest she try Sudafed decongestant from pharmacist (Has to show driver's license). I would ask for 12 hour, not 24 hour, 60 mg.  1 every 8 hours if needed.  Offer referral to [facility] ENT for Sinusitis with Otitis

## 2018-05-18 ENCOUNTER — Encounter: Payer: Self-pay | Admitting: Internal Medicine

## 2018-05-18 DIAGNOSIS — Z6822 Body mass index (BMI) 22.0-22.9, adult: Secondary | ICD-10-CM | POA: Diagnosis not present

## 2018-05-18 DIAGNOSIS — Z780 Asymptomatic menopausal state: Secondary | ICD-10-CM | POA: Diagnosis not present

## 2018-05-18 DIAGNOSIS — N958 Other specified menopausal and perimenopausal disorders: Secondary | ICD-10-CM | POA: Diagnosis not present

## 2018-05-18 DIAGNOSIS — Z1231 Encounter for screening mammogram for malignant neoplasm of breast: Secondary | ICD-10-CM | POA: Diagnosis not present

## 2018-05-18 DIAGNOSIS — Z01419 Encounter for gynecological examination (general) (routine) without abnormal findings: Secondary | ICD-10-CM | POA: Diagnosis not present

## 2018-05-24 ENCOUNTER — Ambulatory Visit: Payer: Self-pay | Admitting: Internal Medicine

## 2018-05-24 LAB — HM PAP SMEAR

## 2018-05-24 LAB — HM DEXA SCAN

## 2018-05-24 LAB — HM MAMMOGRAPHY: HM Mammogram: NORMAL (ref 0–4)

## 2018-06-01 ENCOUNTER — Ambulatory Visit: Payer: Self-pay | Admitting: Internal Medicine

## 2018-06-03 DIAGNOSIS — H524 Presbyopia: Secondary | ICD-10-CM | POA: Diagnosis not present

## 2018-06-03 DIAGNOSIS — H532 Diplopia: Secondary | ICD-10-CM | POA: Diagnosis not present

## 2018-06-06 DIAGNOSIS — H6691 Otitis media, unspecified, right ear: Secondary | ICD-10-CM | POA: Diagnosis not present

## 2018-06-06 DIAGNOSIS — H903 Sensorineural hearing loss, bilateral: Secondary | ICD-10-CM | POA: Diagnosis not present

## 2018-06-07 ENCOUNTER — Encounter: Payer: Self-pay | Admitting: Internal Medicine

## 2018-06-07 ENCOUNTER — Other Ambulatory Visit (INDEPENDENT_AMBULATORY_CARE_PROVIDER_SITE_OTHER): Payer: Medicare Other

## 2018-06-07 ENCOUNTER — Ambulatory Visit (INDEPENDENT_AMBULATORY_CARE_PROVIDER_SITE_OTHER): Payer: Medicare Other | Admitting: Internal Medicine

## 2018-06-07 VITALS — BP 112/70 | HR 82 | Temp 98.0°F | Resp 16 | Ht 65.5 in | Wt 131.2 lb

## 2018-06-07 DIAGNOSIS — E559 Vitamin D deficiency, unspecified: Secondary | ICD-10-CM

## 2018-06-07 DIAGNOSIS — Z1211 Encounter for screening for malignant neoplasm of colon: Secondary | ICD-10-CM

## 2018-06-07 DIAGNOSIS — I10 Essential (primary) hypertension: Secondary | ICD-10-CM | POA: Diagnosis not present

## 2018-06-07 DIAGNOSIS — E785 Hyperlipidemia, unspecified: Secondary | ICD-10-CM

## 2018-06-07 DIAGNOSIS — F320 Major depressive disorder, single episode, mild: Secondary | ICD-10-CM

## 2018-06-07 DIAGNOSIS — Z1212 Encounter for screening for malignant neoplasm of rectum: Secondary | ICD-10-CM

## 2018-06-07 DIAGNOSIS — Z0000 Encounter for general adult medical examination without abnormal findings: Secondary | ICD-10-CM | POA: Diagnosis not present

## 2018-06-07 LAB — COMPREHENSIVE METABOLIC PANEL
ALBUMIN: 4.3 g/dL (ref 3.5–5.2)
ALT: 12 U/L (ref 0–35)
AST: 17 U/L (ref 0–37)
Alkaline Phosphatase: 50 U/L (ref 39–117)
BILIRUBIN TOTAL: 0.6 mg/dL (ref 0.2–1.2)
BUN: 14 mg/dL (ref 6–23)
CALCIUM: 9.8 mg/dL (ref 8.4–10.5)
CO2: 31 mEq/L (ref 19–32)
CREATININE: 0.75 mg/dL (ref 0.40–1.20)
Chloride: 100 mEq/L (ref 96–112)
GFR: 82.02 mL/min (ref >60.00)
Glucose, Bld: 97 mg/dL (ref 70–99)
Potassium: 4.1 mEq/L (ref 3.5–5.1)
Sodium: 137 mEq/L (ref 135–145)
Total Protein: 7.5 g/dL (ref 6.0–8.3)

## 2018-06-07 LAB — CBC WITH DIFFERENTIAL/PLATELET
BASOS ABS: 0.1 10*3/uL (ref 0.0–0.1)
Basophils Relative: 1.4 % (ref 0.0–3.0)
Eosinophils Absolute: 1 10*3/uL — ABNORMAL HIGH (ref 0.0–0.7)
Eosinophils Relative: 12 % — ABNORMAL HIGH (ref 0.0–5.0)
HEMATOCRIT: 45.4 % (ref 36.0–46.0)
HEMOGLOBIN: 15.2 g/dL — AB (ref 12.0–15.0)
LYMPHS PCT: 29.7 % (ref 12.0–46.0)
Lymphs Abs: 2.5 10*3/uL (ref 0.7–4.0)
MCHC: 33.5 g/dL (ref 30.0–36.0)
MCV: 91.1 fl (ref 78.0–100.0)
MONOS PCT: 7.2 % (ref 3.0–12.0)
Monocytes Absolute: 0.6 10*3/uL (ref 0.1–1.0)
NEUTROS ABS: 4.2 10*3/uL (ref 1.4–7.7)
Neutrophils Relative %: 49.7 % (ref 43.0–77.0)
PLATELETS: 460 10*3/uL — AB (ref 150.0–400.0)
RBC: 4.98 Mil/uL (ref 3.87–5.11)
RDW: 13.6 % (ref 11.5–15.5)
WBC: 8.4 10*3/uL (ref 4.0–10.5)

## 2018-06-07 LAB — LIPID PANEL
CHOLESTEROL: 237 mg/dL — AB (ref 0–200)
HDL: 73.8 mg/dL (ref >39.00)
LDL Cholesterol: 138 mg/dL — ABNORMAL HIGH (ref 0–99)
NonHDL: 163.56
TRIGLYCERIDES: 128 mg/dL (ref 0.0–149.0)
Total CHOL/HDL Ratio: 3
VLDL: 25.6 mg/dL (ref 0.0–40.0)

## 2018-06-07 LAB — VITAMIN D 25 HYDROXY (VIT D DEFICIENCY, FRACTURES): VITD: 48.18 ng/mL (ref 30.00–100.00)

## 2018-06-07 MED ORDER — BUPROPION HCL ER (XL) 150 MG PO TB24: 150.0000 mg | ORAL_TABLET | Freq: Every day | ORAL | 0 refills | Status: DC

## 2018-06-07 MED ORDER — DULOXETINE HCL 30 MG PO CPEP: 30.0000 mg | ORAL_CAPSULE | Freq: Every day | ORAL | 0 refills | Status: DC

## 2018-06-07 NOTE — Progress Notes (Signed)
Subjective:  Patient ID: Debbie Hogan, female    DOB: [birth year]  Age: 67 y.o. MRN: [id number]  CC: Hypertension; Hyperlipidemia; Depression; and Annual Exam   HPI [facility] presents for a CPX.  Over the last few months she has had a few muscle aches in her hips and lower back so she stopped taking atorvastatin 3 weeks ago.  She tells me the symptoms have improved.  She also wants to change her antidepressant from Wellbutrin to Cymbalta.  She tells me her daughter is taking Cymbalta and has had a good response to it.  She complains of mild anxiety, anhedonia, and sleep disturbance.  Outpatient Medications Prior to Visit  Medication Sig Dispense Refill  . clindamycin (CLEOCIN) 300 MG capsule Take by mouth.    . calcium citrate-vitamin D (CITRACAL+D) 315-200 MG-UNIT per tablet Take 1 tablet by mouth every other day.     . estradiol (VIVELLE-DOT) 0.0375 MG/24HR Place 1 patch onto the skin 2 (two) times a week. Place on Monday and Thursday    . fluticasone (FLONASE) 50 MCG/ACT nasal spray USE 2 SPRAYS IN EACH NOSTRIL DAILY 48 g 1  . Fluticasone-Salmeterol (ADVAIR DISKUS) 100-50 MCG/DOSE AEPB Inhale 1 puff into the lungs 2 (two) times daily as needed. 3 each 3  . LORazepam (ATIVAN) 0.5 MG tablet Take 1 tablet (0.5 mg total) by mouth every 8 (eight) hours. 30 tablet 5  . losartan (COZAAR) 100 MG tablet Take 0.5 tablets (50 mg total) by mouth daily. 90 tablet 1  . PROAIR HFA 108 (90 Base) MCG/ACT inhaler USE 2 INHALATIONS INTO THE LUNGS  EVERY 6 HOURS AS NEEDED FOR WHEEZING OR SHORTNESS OF BREATH 25.5 g 1  . amoxicillin (AMOXIL) 500 MG capsule Take 2 capsules (1,000 mg total) by mouth 2 (two) times daily. Fill if fever, change in hearing, worsening ear pain, or zyrtec-d fails to alleviate your symptoms in 7 days. 40 capsule 0  . atorvastatin (LIPITOR) 20 MG tablet TAKE 1 TABLET DAILY 90 tablet 0  . atovaquone-proguanil (MALARONE) 250-100 MG TABS tablet Take 1 tablet by mouth daily. Start 2  days prior to travel to malaria area, throughout travel and for 7 days upon return. 23 tablet 0  . atovaquone-proguanil (MALARONE) 250-100 MG TABS tablet Take 1 tablet by mouth daily. Start 2 days prior to travel to malaria area, throughout travel and for 7 days upon return. 23 tablet 0  . buPROPion (WELLBUTRIN XL) 300 MG 24 hr tablet TAKE 1 TABLET DAILY 90 tablet 0  . enoxaparin (LOVENOX) 60 MG/0.6ML injection Inject 0.6 mLs (60 mg total) into the skin once for 1 dose. 60 mg 4 hours prior to airplane travel x 2 1.2 mL 0  . predniSONE (DELTASONE) 10 MG tablet 2 tabs by mouth per day for 7 days 14 tablet 0  . progesterone (PROMETRIUM) 200 MG capsule Take 1 capsule (200 mg total) by mouth daily. Take for 12 days every 3rd month 30 capsule 0  . zolpidem (AMBIEN) 10 MG tablet Take 1 tablet (10 mg total) by mouth at bedtime as needed for sleep. 10 tablet 0   No facility-administered medications prior to visit.     ROS Review of Systems  Constitutional: Negative for diaphoresis, fatigue and unexpected weight change.  HENT: Negative.   Eyes: Negative for visual disturbance.  Respiratory: Negative for cough, chest tightness, shortness of breath and wheezing.   Cardiovascular: Negative for chest pain, palpitations and leg swelling.  Gastrointestinal: Negative for abdominal pain, constipation, diarrhea,  nausea and vomiting.  Genitourinary: Negative.  Negative for difficulty urinating and dysuria.  Musculoskeletal: Negative.  Negative for arthralgias and myalgias.  Skin: Negative.   Neurological: Negative.  Negative for dizziness, weakness, light-headedness and headaches.  Hematological: Negative for adenopathy. Does not bruise/bleed easily.  Psychiatric/Behavioral: Positive for decreased concentration, dysphoric mood and sleep disturbance. Negative for confusion, hallucinations, self-injury and suicidal ideas. The patient is nervous/anxious. The patient is not hyperactive.     Objective:  BP 112/70  (BP Location: Left Arm, Patient Position: Sitting, Cuff Size: Normal)   Pulse 82   Temp 98 F (36.7 C) (Oral)   Resp 16   Ht 5' 5.5" (1.664 m)   Wt 131 lb 4 oz (59.5 kg)   SpO2 97%   BMI 21.51 kg/m   BP Readings from Last 3 Encounters:  06/07/18 112/70  03/10/18 118/76  03/06/18 (!) 142/90    Wt Readings from Last 3 Encounters:  06/07/18 131 lb 4 oz (59.5 kg)  03/10/18 134 lb (60.8 kg)  09/21/17 134 lb (60.8 kg)    Physical Exam  Constitutional: She is oriented to person, place, and time. No distress.  HENT:  Mouth/Throat: Oropharynx is clear and moist. No oropharyngeal exudate.  Eyes: Conjunctivae are normal. No scleral icterus.  Neck: Normal range of motion. Neck supple. No JVD present. No thyromegaly present.  Cardiovascular: Normal rate, regular rhythm and normal heart sounds. Exam reveals no gallop and no friction rub.  No murmur heard. Pulmonary/Chest: Effort normal and breath sounds normal. She has no wheezes. She has no rales.  Abdominal: Soft. Bowel sounds are normal. She exhibits no mass. There is no hepatosplenomegaly. There is no tenderness.  Musculoskeletal: Normal range of motion. She exhibits no edema, tenderness or deformity.  Lymphadenopathy:    She has no cervical adenopathy.  Neurological: She is alert and oriented to person, place, and time.  Skin: Skin is warm and dry. She is not diaphoretic. No pallor.  Psychiatric: She has a normal mood and affect. Her behavior is normal. Judgment and thought content normal.  Vitals reviewed.   Lab Results  Component Value Date   WBC 8.4 06/07/2018   HGB 15.2 (H) 06/07/2018   HCT 45.4 06/07/2018   PLT 460.0 (H) 06/07/2018   GLUCOSE 97 06/07/2018   CHOL 237 (H) 06/07/2018   TRIG 128.0 06/07/2018   HDL 73.80 06/07/2018   LDLDIRECT 133.7 01/08/2010   LDLCALC 138 (H) 06/07/2018   ALT 12 06/07/2018   AST 17 06/07/2018   NA 137 06/07/2018   K 4.1 06/07/2018   CL 100 06/07/2018   CREATININE 0.75 06/07/2018    BUN 14 06/07/2018   CO2 31 06/07/2018   TSH 2.04 04/13/2017   HGBA1C 5.5 02/07/2013    No results found.  Assessment & Plan:   Lisett was seen today for hypertension, hyperlipidemia, depression and annual exam.  Diagnoses and all orders for this visit:  Essential hypertension- Her blood pressure is well controlled.  Electrolytes and renal function are normal.  Will continue the ARB. -     CBC with Differential/Platelet; Future -     Comprehensive metabolic panel; Future  Vitamin D deficiency -     VITAMIN D 25 Hydroxy (Vit-D Deficiency, Fractures); Future  Hyperlipidemia with target LDL less than 130- She is not tolerating the statin.  Also, she has a low ASCVD risk score so I do not recommend that she take a statin for CV risk reduction. -     Lipid  panel; Future  Current mild episode of major depressive disorder without prior episode ([place]) - At her request she will start the transition from Wellbutrin to Cymbalta.  At this time, will cut the Wellbutrin dose in half and start Cymbalta at 30 mg.  In 1 month we will touch base with each other and consider tapering the Wellbutrin dose again and gradually increasing the Cymbalta dose. -     buPROPion (WELLBUTRIN XL) 150 MG 24 hr tablet; Take 1 tablet (150 mg total) by mouth daily. -     DULoxetine (CYMBALTA) 30 MG capsule; Take 1 capsule (30 mg total) by mouth daily.  Colon cancer screening -     Cologuard  Screening for malignant neoplasm of the rectum -     Cologuard  Routine general medical examination at a health care facility   I have discontinued Kiylah Belote "Jenny"'s progesterone, atovaquone-proguanil, zolpidem, enoxaparin, atovaquone-proguanil, amoxicillin, predniSONE, atorvastatin, and buPROPion. I am also having her start on buPROPion and DULoxetine. Additionally, I am having her maintain her calcium citrate-vitamin D, estradiol, PROAIR HFA, fluticasone, Fluticasone-Salmeterol, losartan, LORazepam, and  clindamycin.  Meds ordered this encounter  Medications  . buPROPion (WELLBUTRIN XL) 150 MG 24 hr tablet    Sig: Take 1 tablet (150 mg total) by mouth daily.    Dispense:  30 tablet    Refill:  0  . DULoxetine (CYMBALTA) 30 MG capsule    Sig: Take 1 capsule (30 mg total) by mouth daily.    Dispense:  30 capsule    Refill:  0     Follow-up: Return in about 4 weeks (around 07/05/2018).  Scarlette Calico, MD

## 2018-06-07 NOTE — Patient Instructions (Signed)

## 2018-06-08 NOTE — Assessment & Plan Note (Signed)

## 2018-07-04 ENCOUNTER — Encounter (HOSPITAL_COMMUNITY): Payer: Self-pay | Admitting: Emergency Medicine

## 2018-07-04 ENCOUNTER — Ambulatory Visit (HOSPITAL_COMMUNITY)
Admission: EM | Admit: 2018-07-04 | Discharge: 2018-07-04 | Disposition: A | Discharge status: Home / Self Care | Payer: Medicare Other | Attending: Family Medicine | Admitting: Family Medicine

## 2018-07-04 DIAGNOSIS — M7021 Olecranon bursitis, right elbow: Secondary | ICD-10-CM

## 2018-07-04 MED ORDER — NAPROXEN 500 MG PO TABS: 500.0000 mg | ORAL_TABLET | Freq: Two times a day (BID) | ORAL | 0 refills | Status: DC

## 2018-07-04 NOTE — ED Notes (Signed)
Ace wrap applied by [employer], pa

## 2018-07-04 NOTE — ED Triage Notes (Signed)
Pt states she woke up Saturday morning with her R elbow red, and swollen. No injury.

## 2018-07-04 NOTE — Discharge Instructions (Addendum)
Use anti-inflammatories for pain/swelling. You may take up to 600-800 mg Ibuprofen every 8 hours OR Naprosyn twice daily with food. You may supplement with Tylenol [phone] mg every 8 hours.   Wear ace wrap for compression  Monitor swelling, redness and range of motion of elbow, please follow-up if redness spreading, developing fever or increased pain with movement

## 2018-07-05 NOTE — ED Provider Notes (Signed)
[place]    CSN: [id number] Arrival date & time: 07/04/18  6734     History   Chief Complaint Chief Complaint  Patient presents with  . Joint Swelling    HPI Debbie Hogan is a 67 y.o. female history of asthma, hypertension, hyperlipidemia, previous DVT presenting today for evaluation of right elbow swelling.  Patient states that for the past 2 days she has had redness around her right elbow and increased swelling.  She denies any specific injury and cannot recall hitting her elbow on anything.  [facility]  Denies fevers.  Denies difficulty moving her elbow.  Denies previous issues with this elbow.  HPI  Past Medical History:  Diagnosis Date  . 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 Anxiety    PMH of  . Colonic polyp 2008  . DVT (deep venous thrombosis) ([place])    LLE 2007 (post trauma and prolonged air travel), Lovenox x 6 months  . Hyperlipemia    see NMR data  . Hypertension   . Kidney stone    X1; spontaneous passgae  . Migraine   . MVP (mitral valve prolapse)    Dr.Brackbill;Cardiology    Patient Active Problem List   Diagnosis Date Noted  . Current mild episode of major depressive disorder without prior episode ([place]) 06/07/2018  . Travel advice encounter 10/22/2017  . Abnormal MRI of head 04/30/2017  . Seasonal and perennial allergic rhinitis 03/18/2017  . Routine general medical examination at a health care facility 05/01/2016  . Pancreatic cyst 02/07/2013  . Kidney stone   . Vitamin D deficiency 01/08/2010  . Hyperlipidemia with target LDL less than 130 12/31/2008  . Essential hypertension 12/31/2008  . History of colonic polyps 12/31/2008  . Asthma, mild persistent 01/09/2008    Past Surgical History:  Procedure Laterality Date  . COLONOSCOPY W/ POLYPECTOMY  2009   Dr Earle Gell ,[facility]  . DILATION AND CURETTAGE OF UTERUS  2014   neg pathology  .  NASAL SINUS SURGERY  2009   polyps;Dr.Rosen  . OOPHORECTOMY     3  benign cysts  . TONSILLECTOMY      OB History   None      Home Medications    Prior to Admission medications   Medication Sig Start Date End Date Taking? Authorizing Provider  buPROPion (WELLBUTRIN XL) 150 MG 24 hr tablet Take 1 tablet (150 mg total) by mouth daily. 06/07/18   Janith Lima, MD  calcium citrate-vitamin D (CITRACAL+D) 315-200 MG-UNIT per tablet Take 1 tablet by mouth every other day.     [provider]  DULoxetine (CYMBALTA) 30 MG capsule Take 1 capsule (30 mg total) by mouth daily. 06/07/18   Janith Lima, MD  estradiol (VIVELLE-DOT) 0.0375 MG/24HR Place 1 patch onto the skin 2 (two) times a week. Place on Monday and Thursday    [provider]  fluticasone [facility]) 50 MCG/ACT nasal spray USE 2 SPRAYS IN [facility] NOSTRIL DAILY 01/29/17   Baird Lyons D, MD  Fluticasone-Salmeterol (ADVAIR DISKUS) 100-50 MCG/DOSE AEPB Inhale 1 puff into the lungs 2 (two) times daily as needed. 02/05/17   Deneise Lever, MD  LORazepam (ATIVAN) 0.5 MG tablet Take 1 tablet (0.5 mg total) by mouth every 8 (eight) hours. 09/21/17   Deneise Lever, MD  losartan (COZAAR) 100 MG tablet Take 0.5 tablets (50 mg total) by mouth daily. 07/28/17  Janith Lima, MD  naproxen (NAPROSYN) 500 MG tablet Take 1 tablet (500 mg total) by mouth 2 (two) times daily. 07/04/18   Oza Oberle C, PA-C  PROAIR HFA 108 (90 Base) MCG/ACT inhaler USE 2 INHALATIONS INTO THE LUNGS  EVERY 6 HOURS AS NEEDED FOR WHEEZING OR SHORTNESS OF BREATH 01/29/17   Deneise Lever, MD    Family History Family History  Problem Relation Age of Onset  . Dementia Father   . Asthma Father   . Depression Father   . Alcohol abuse Father   . Hypotension Father   . Hypertension Mother        MGM   . Depression Mother   . Hypertension Sister   . Alcohol abuse Sister   . Depression Sister        X 2  . Alcohol abuse Sister   . Alzheimer's  disease Paternal Uncle   . Ovarian cancer Maternal Aunt   . Alcohol abuse Sister        X 2  . Diabetes Neg Hx   . Stroke Neg Hx   . Heart attack Neg Hx   . Cancer Neg Hx   . Early death Neg Hx     Social History Social History   Tobacco Use  . Smoking status: Former Smoker    Last attempt to quit: 10/19/1980    Years since quitting: 37.7  . Smokeless tobacco: Never Used  . Tobacco comment: Positive history of passive tobacco smoke exposure until 1990.Smoked [phone], up to 1/2 ppd  Substance Use Topics  . Alcohol use: Yes    Alcohol/week: 5.0 standard drinks    Types: 5 Glasses of wine per week    Comment: Social alcohol  4 / week  . Drug use: No     Allergies   Atorvastatin; Kiwi extract; Other; and Montelukast sodium   Review of Systems Review of Systems  Constitutional: Negative for fatigue and fever.  HENT: Negative for mouth sores.   Eyes: Negative for visual disturbance.  Respiratory: Negative for shortness of breath.   Cardiovascular: Negative for chest pain.  Gastrointestinal: Negative for abdominal pain, nausea and vomiting.  Genitourinary: Negative for genital sores.  Musculoskeletal: Positive for arthralgias and joint swelling.  Skin: Positive for color change. Negative for rash and wound.  Neurological: Negative for dizziness, weakness, light-headedness and headaches.     Physical Exam Triage Vital Signs ED Triage Vitals [07/04/18 1106]  Enc Vitals Group     BP (!) 133/98     Pulse Rate 79     Resp 16     Temp 97.6 F (36.4 C)     Temp src      SpO2 99 %     Weight      Height      Head Circumference      Peak Flow      Pain Score      Pain Loc      Pain Edu?      Excl. in [place]?    No data found.  Updated Vital Signs BP (!) 133/98   Pulse 79   Temp 97.6 F (36.4 C)   Resp 16   SpO2 99%   Visual Acuity Right Eye Distance:   Left Eye Distance:   Bilateral Distance:    Right Eye Near:   Left Eye Near:    Bilateral Near:      Physical Exam  Constitutional: She is oriented to person, place,  and time. She appears well-developed and well-nourished.  No acute distress  HENT:  Head: Normocephalic and atraumatic.  Nose: Nose normal.  Eyes: Conjunctivae are normal.  Neck: Neck supple.  Cardiovascular: Normal rate.  Pulmonary/Chest: Effort normal. No respiratory distress.  Abdominal: She exhibits no distension.  Musculoskeletal: Normal range of motion.  Right elbow with erythema and swelling to posterior aspect, bursa feels enlarged and slightly fluctuant, swelling extending into proximal forearm, relatively full active range of motion at elbow  Radial pulse 2+  Neurological: She is alert and oriented to person, place, and time.  Skin: Skin is warm and dry.  Psychiatric: She has a normal mood and affect.  Nursing note and vitals reviewed.    UC Treatments / Results  Labs (all labs ordered are listed, but only abnormal results are displayed) Labs Reviewed - No data to display  EKG None  Radiology No results found.  Procedures Procedures (including critical care time)  Medications Ordered in UC Medications - No data to display  Initial Impression / Assessment and Plan / UC Course  I have reviewed the triage vital signs and the nursing notes.  Pertinent labs & imaging results that were available during my care of the patient were reviewed by me and considered in my medical decision making (see chart for details).     Patient likely with olecranon bursitis, given location and presentation feel this is more likely than cellulitis.  Will treat with anti-inflammatories, compression.  Will have patient monitor for worsening symptoms for possible cellulitis and return if not improving or worsening.Discussed strict return precautions. Patient verbalized understanding and is agreeable with plan.  Final Clinical Impressions(s) / UC Diagnoses   Final diagnoses:  Olecranon bursitis of right elbow      Discharge Instructions     Use anti-inflammatories for pain/swelling. You may take up to 600-800 mg Ibuprofen every 8 hours OR Naprosyn twice daily with food. You may supplement with Tylenol [phone] mg every 8 hours.   Wear ace wrap for compression  Monitor swelling, redness and range of motion of elbow, please follow-up if redness spreading, developing fever or increased pain with movement   ED Prescriptions    Medication Sig Dispense Auth. Provider   naproxen (NAPROSYN) 500 MG tablet Take 1 tablet (500 mg total) by mouth 2 (two) times daily. 30 tablet Audrianna Driskill, [place] C, PA-C     Controlled Substance Prescriptions [place] Controlled Substance Registry consulted? Not Applicable   Janith Lima, [place] 07/05/18 3300

## 2018-07-12 ENCOUNTER — Other Ambulatory Visit: Payer: Self-pay | Admitting: Internal Medicine

## 2018-07-12 DIAGNOSIS — F320 Major depressive disorder, single episode, mild: Secondary | ICD-10-CM

## 2018-07-12 MED ORDER — BUPROPION HCL ER (XL) 150 MG PO TB24: 150.0000 mg | ORAL_TABLET | Freq: Every day | ORAL | 1 refills | Status: DC

## 2018-07-12 MED ORDER — DULOXETINE HCL 30 MG PO CPEP: 30.0000 mg | ORAL_CAPSULE | Freq: Every day | ORAL | 1 refills | Status: DC

## 2018-07-19 ENCOUNTER — Encounter: Payer: Self-pay | Admitting: Internal Medicine

## 2018-07-19 ENCOUNTER — Ambulatory Visit (INDEPENDENT_AMBULATORY_CARE_PROVIDER_SITE_OTHER): Payer: Medicare Other | Admitting: Internal Medicine

## 2018-07-19 DIAGNOSIS — M7021 Olecranon bursitis, right elbow: Secondary | ICD-10-CM | POA: Diagnosis not present

## 2018-07-19 DIAGNOSIS — Z1211 Encounter for screening for malignant neoplasm of colon: Secondary | ICD-10-CM | POA: Diagnosis not present

## 2018-07-19 DIAGNOSIS — Z1212 Encounter for screening for malignant neoplasm of rectum: Secondary | ICD-10-CM | POA: Diagnosis not present

## 2018-07-20 DIAGNOSIS — M7021 Olecranon bursitis, right elbow: Secondary | ICD-10-CM | POA: Insufficient documentation

## 2018-07-20 NOTE — Progress Notes (Signed)
   Subjective:    Patient ID: Debbie Hogan, female    DOB: 08-09-1951, 67 y.o.   MRN: [id number]  HPI The patient is a 67 YO female coming in for right elbow swelling. Started about 2 weeks ago with swelling in the elbow, redness and swelling below and above the elbow. She was having pain with movement and some restriction of movement. She had been moving her mom's house as well as some body pump class. She went to urgent care and they gave her naproxen and compression. The redness worsened and the pain worsened so she did doc in box and they gave her 1 week doxycycline. This helped a good bit of the swelling to go down and the redness above and below her elbow. She is still having some skin shedding on the elbow and redness. Swelling is there but less. She is still using the naproxen for pain and this helps with her mobility. She is able to have full ROM but it gets stiff with resting too long. She denies fevers or chills. Pain was 7/10 at maximum and 3-4/10 currently. She was advised to get it looked at to see if it needed to be drained or more medicine for it.   Review of Systems  Constitutional: Positive for activity change. Negative for appetite change, chills, fatigue, fever and unexpected weight change.  Respiratory: Negative.   Cardiovascular: Negative.   Gastrointestinal: Negative.   Musculoskeletal: Positive for arthralgias, joint swelling and myalgias. Negative for back pain and gait problem.  Skin: Positive for color change.  Neurological: Negative.       Objective:   Physical Exam  Constitutional: She is oriented to person, place, and time. She appears well-developed and well-nourished.  HENT:  Head: Normocephalic and atraumatic.  Eyes: EOM are normal.  Neck: Normal range of motion.  Cardiovascular: Normal rate and regular rhythm.  Pulmonary/Chest: Effort normal and breath sounds normal. No respiratory distress. She has no wheezes. She has no rales.  Musculoskeletal: She  exhibits edema and tenderness.  Right olecranon bursa with small amount of fluid present, no crepitus or pain on palpation, no redness to the skin proximal or distal to the elbow, there is no swelling in the soft tissue proximal or distal to the elbow. Some redness to the skin on the elbow and skin shedding is apparent over the olecranon bursa. Full ROM passive and active.   Neurological: She is alert and oriented to person, place, and time. Coordination normal.  Skin: Skin is warm and dry.   Vitals:   07/19/18 1500  BP: 124/70  Pulse: 86  Temp: 98.5 F (36.9 C)  TempSrc: Oral  SpO2: 97%  Weight: 133 lb (60.3 kg)  Height: 5' 5.5" (1.664 m)      Assessment & Plan:

## 2018-07-20 NOTE — Assessment & Plan Note (Signed)
Suspect she had a cellulitis which was treated with doxycycline. She now has some mild swelling in the elbow which likely does not need drainage today and given recent infection I would be reluctant to drain since fluid amount is low to avoid introduction of bacteria. No abscess on exam. Advised ice, compression and naproxen for pain. Come back for more swelling, more redness, more pain, less mobility of the arm.

## 2018-07-23 ENCOUNTER — Other Ambulatory Visit: Payer: Self-pay | Admitting: Internal Medicine

## 2018-07-23 LAB — COLOGUARD: COLOGUARD: POSITIVE

## 2018-08-08 ENCOUNTER — Encounter: Payer: Self-pay | Admitting: Internal Medicine

## 2018-08-08 ENCOUNTER — Encounter: Payer: Self-pay | Admitting: Gastroenterology

## 2018-08-08 DIAGNOSIS — R195 Other fecal abnormalities: Secondary | ICD-10-CM

## 2018-08-08 NOTE — Progress Notes (Signed)
Pt informed of positive cologuard result. Referral to GI has been entered and pt has an appt scheduled for December.

## 2018-08-08 NOTE — Telephone Encounter (Signed)
I have contacted the pt with the results from the cologuard. GI contacted pt before I had an opportunity to.

## 2018-09-04 DIAGNOSIS — Z23 Encounter for immunization: Secondary | ICD-10-CM | POA: Diagnosis not present

## 2018-09-08 ENCOUNTER — Other Ambulatory Visit: Payer: Self-pay

## 2018-09-21 ENCOUNTER — Ambulatory Visit: Payer: Self-pay | Admitting: Internal Medicine

## 2018-09-29 ENCOUNTER — Encounter: Payer: Self-pay | Admitting: Internal Medicine

## 2018-09-29 ENCOUNTER — Ambulatory Visit (INDEPENDENT_AMBULATORY_CARE_PROVIDER_SITE_OTHER): Payer: Medicare Other | Admitting: Internal Medicine

## 2018-09-29 VITALS — BP 110/66 | HR 69 | Ht 65.5 in | Wt 134.6 lb

## 2018-09-29 DIAGNOSIS — J4530 Mild persistent asthma, uncomplicated: Secondary | ICD-10-CM

## 2018-09-29 DIAGNOSIS — F320 Major depressive disorder, single episode, mild: Secondary | ICD-10-CM | POA: Diagnosis not present

## 2018-09-29 MED ORDER — FLUTICASONE-SALMETEROL 100-50 MCG/DOSE IN AEPB: 1.0000 | INHALATION_SPRAY | Freq: Two times a day (BID) | RESPIRATORY_TRACT | 3 refills | Status: DC | PRN

## 2018-09-29 MED ORDER — ALBUTEROL SULFATE HFA 108 (90 BASE) MCG/ACT IN AERS: INHALATION_SPRAY | RESPIRATORY_TRACT | 4 refills | Status: DC

## 2018-09-29 MED ORDER — LORAZEPAM 0.5 MG PO TABS: 0.5000 mg | ORAL_TABLET | Freq: Three times a day (TID) | ORAL | 3 refills | Status: DC | PRN

## 2018-09-29 MED ORDER — DOXYCYCLINE HYCLATE 100 MG PO TABS: 100.0000 mg | ORAL_TABLET | Freq: Two times a day (BID) | ORAL | 5 refills | Status: DC

## 2018-09-29 MED ORDER — FLUTICASONE PROPIONATE 50 MCG/ACT NA SUSP: 2.0000 | Freq: Every day | NASAL | 4 refills | Status: DC

## 2018-09-29 NOTE — Progress Notes (Signed)
Subjective:    Patient ID: Debbie Hogan, female    DOB: 1950-10-23, 67 y.o.   MRN: [id number]  HPI F never smoker, followed for asthma, hx DVT, HBP, hx APBA with abnormal cxr. Years ago, presented with lung nodules of retained mucus- Suspected but never proved ABPA. January 28, 2010 after a pneumonia 2 months prior. EOS were 11.6%, down from 28 %.  EOS 05/01/16- 1.4K (H) FENO 03/18/17- 72 (H) Office Spirometry 03/18/17-WNL-FVC 3.21/96%, FEV1 2.34/91%, ratio 0.73, FEF 25-75% 1.81/82%.  -----------------------------------------------------------------------------  09/21/17- 67 year old female former smoker followed for asthma, history DVT, HBP, history ABPA with abnormal CXR, nasal polyps. Originally presented with lung nodules of retained mucus-never proved ABPA, resected nasal polyps ---asthmatic bronchitis: Pt has slight productive cough-clear in color. Otherwise denies any wheezing or SOB.  Advair100, ProAir, Flonase 1 or 2 asthmatic bronchitis episodes per year, typically with infection.  Feels well controlled continuing Advair 100, 1 puff daily.  After a few days without this she begins to feel some chest tightness.  Infrequent need for rescue inhaler and no sleep disturbance.  Occasional cough productive of clear mucous plugs. We have provided lorazepam 0.5 mg at her request  for occasional use for anxiety, and occasional episode of doxycycline, over the years.  Otherwise she feels stable.  A recurrent DVT was recently ruled out.  09/29/2018- 67 year old female former smoker followed for asthma, history DVT, HBP, history ABPA with abnormal CXR, nasal polyps. Originally presented with lung nodules of retained mucus-never proved ABPA, resected nasal polyps -----asthma follow up, feels she is managing asthma well Advair 100, pro-air HFA He feels she is managing her asthma very well with no exacerbations.  Despite dusty home undergoing renovation, she is comfortable with less regular use of Advair.   She does use it for intervals which she thinks risk is increased-travel etc.  Has not used rescue inhaler more than a couple of times in the past year. Not quite ready for medication refills and there was an IT problem with prescriptions this morning so we are updating "No Print" status and she can call when needed.  She keeps doxycycline available, uses lorazepam rarely if needed and asks I continue to be the one to refill it since I have been over the years.  ROS-see HPI    += positive Constitutional:   No-   weight loss, night sweats, fevers, chills, fatigue, lassitude. HEENT:   + headaches, difficulty swallowing, tooth/dental problems, sore throat,       No-  sneezing, itching, ear ache, nasal congestion, post nasal drip,  CV:  No-   chest pain, orthopnea, PND, swelling in lower extremities, anasarca, dizziness,                     palpitations Resp: No-   shortness of breath with exertion or at rest.                productive cough,  no- non-productive cough,  No- coughing up of blood.         No- change in color of mucus.  No- wheezing.   Skin: No-   rash or lesions. GI:  No-   heartburn, indigestion, abdominal pain, nausea, vomiting,  GU:  MS:  No-   joint pain or swelling.   Neuro-     nothing unusual Psych:  No- change in mood or affect. No depression or anxiety.  No memory loss.  OBJ- Physical Exam General- Alert, Oriented, Affect-appropriate, Distress- none acute, WDWN  Skin- rash-none, lesions- none, excoriation- none Lymphadenopathy- none Head- atraumatic            Eyes- Gross vision intact, PERRLA, conjunctivae and secretions clear            Ears- + bilateral cerumen            Nose-, no-Septal dev, mucus, erosion, perforation             Throat- Mallampati II , mucosa clear , drainage- none, tonsils- atrophic Neck- flexible , trachea midline, no stridor , thyroid nl, carotid no bruit Chest - symmetrical excursion , unlabored           Heart/CV- RRR , no murmur , no  gallop  , no rub, nl s1 s2                           - JVD- none , edema- none, stasis changes- none, varices- none           Lung-clear/unlabored, wheeze- none, cough- none , dullness-none, rub- none           Chest wall-  Abd-  Br/ Gen/ Rectal- Not done, not indicated Extrem- cyanosis- none, clubbing, none, atrophy- none, strength- nl Neuro- grossly intact to observation

## 2018-09-29 NOTE — Assessment & Plan Note (Signed)
Her status is becoming mild intermittent well-controlled uncomplicated. Up-to-date on vaccinations-reviewed Plan-we can continue current meds, including doxycycline available if needed.

## 2018-09-29 NOTE — Patient Instructions (Addendum)
Continuing present meds - call for refills as needed  Please call if we can help

## 2018-09-29 NOTE — Assessment & Plan Note (Signed)
I had agreed to refill lorazepam for occasional use, starting years ago.  She understands I do not manage her behavioral health problems and if appropriate, she should go to her mental health person.

## 2018-09-30 ENCOUNTER — Ambulatory Visit (AMBULATORY_SURGERY_CENTER): Payer: Self-pay | Admitting: *Deleted

## 2018-09-30 VITALS — Ht 65.5 in | Wt 134.0 lb

## 2018-09-30 DIAGNOSIS — R195 Other fecal abnormalities: Secondary | ICD-10-CM

## 2018-09-30 NOTE — Progress Notes (Signed)
Patient denies any allergies to eggs or soy. Patient denies any problems with anesthesia/sedation. Patient denies any oxygen use at home. Patient denies taking any diet/weight loss medications or blood thinners. EMMI education assisgned to patient on colonoscopy, this was explained and instructions given to patient. 

## 2018-10-06 ENCOUNTER — Encounter: Payer: Self-pay | Admitting: Gastroenterology

## 2018-10-06 ENCOUNTER — Ambulatory Visit (AMBULATORY_SURGERY_CENTER): Payer: Medicare Other | Admitting: Gastroenterology

## 2018-10-06 VITALS — BP 113/60 | HR 62 | Temp 97.1°F | Resp 15 | Ht 65.5 in | Wt 134.0 lb

## 2018-10-06 DIAGNOSIS — D123 Benign neoplasm of transverse colon: Secondary | ICD-10-CM | POA: Diagnosis not present

## 2018-10-06 DIAGNOSIS — K621 Rectal polyp: Secondary | ICD-10-CM | POA: Diagnosis not present

## 2018-10-06 DIAGNOSIS — Z1211 Encounter for screening for malignant neoplasm of colon: Secondary | ICD-10-CM | POA: Diagnosis not present

## 2018-10-06 DIAGNOSIS — K635 Polyp of colon: Secondary | ICD-10-CM | POA: Diagnosis not present

## 2018-10-06 DIAGNOSIS — R195 Other fecal abnormalities: Secondary | ICD-10-CM

## 2018-10-06 DIAGNOSIS — Z86010 Personal history of colonic polyps: Secondary | ICD-10-CM | POA: Diagnosis not present

## 2018-10-06 LAB — HM COLONOSCOPY

## 2018-10-06 MED ORDER — SODIUM CHLORIDE 0.9 % IV SOLN: 500.0000 mL | Freq: Once | INTRAVENOUS | Status: DC

## 2018-10-06 NOTE — Progress Notes (Signed)
Called to room to assist during endoscopic procedure.  Patient ID and intended procedure confirmed with present staff. Received instructions for my participation in the procedure from the performing physician.  

## 2018-10-06 NOTE — Progress Notes (Signed)
PT taken to PACU. Monitors in place. VSS. Report given to RN. 

## 2018-10-06 NOTE — Op Note (Signed)
[facility] Patient Name: Debbie Hogan Procedure Date: 10/06/2018 9:15 AM MRN: [id number] Endoscopist: Thornton Park MD, MD Age: 67 Referring MD:  Date of Birth: 04-10-1951 Gender: Female Account #: [id number] Procedure:                Colonoscopy Indications:              Screening for colorectal malignant neoplasm.                            Colonoscopy 10 years ago at [facility]. Polyp was                            removed but she was told to continue with routine                            interval. Sister with colon polyps. No known family                            history of colon cancer. No baseline GI symptoms. Medicines:                See the Anesthesia note for documentation of the                            administered medications Procedure:                Pre-Anesthesia Assessment:                           - Prior to the procedure, a History and Physical                            was performed, and patient medications and                            allergies were reviewed. The patient's tolerance of                            previous anesthesia was also reviewed. The risks                            and benefits of the procedure and the sedation                            options and risks were discussed with the patient.                            All questions were answered, and informed consent                            was obtained. Prior Anticoagulants: The patient has                            taken no previous anticoagulant or antiplatelet  agents. ASA Grade Assessment: III - A patient with                            severe systemic disease. After reviewing the risks                            and benefits, the patient was deemed in                            satisfactory condition to undergo the procedure.                           After obtaining informed consent, the colonoscope                            was passed  under direct vision. Throughout the                            procedure, the patient's blood pressure, pulse, and                            oxygen saturations were monitored continuously. The                            Colonoscope was introduced through the anus and                            advanced to the the terminal ileum, with                            identification of the appendiceal orifice and IC                            valve. The colonoscopy was performed without                            difficulty. The patient tolerated the procedure                            well. The quality of the bowel preparation was good. Scope In: 9:30:01 AM Scope Out: 9:44:26 AM Scope Withdrawal Time: 0 hours 11 minutes 9 seconds  Total Procedure Duration: 0 hours 14 minutes 25 seconds  Findings:                 The perianal and digital rectal examinations were                            normal.                           A 4 mm polyp was found in the hepatic flexure. The                            polyp was sessile. The polyp was removed with  a                            piecemeal technique using a cold biopsy forceps.                            Resection and retrieval were complete.                           Two sessile polyps were found in the rectum. The                            polyps were 2 to 3 mm in size. These polyps were                            removed with a cold biopsy forceps. Resection and                            retrieval were complete.                           The exam was otherwise without abnormality on                            direct and retroflexion views. Complications:            No immediate complications. Estimated Blood Loss:     Estimated blood loss: none. Impression:               - One 4 mm polyp at the hepatic flexure, removed                            piecemeal using a cold biopsy forceps. Resected and                            retrieved.                            - Two 2 to 3 mm polyps in the rectum, removed with                            a cold biopsy forceps. Resected and retrieved.                           - The examination was otherwise normal on direct                            and retroflexion views. Recommendation:           - Discharge patient to home.                           - Resume previous diet.                           - Continue present medications.                           -  Await pathology results.                           - Repeat colonoscopy in 5 years for surveillance if                            at least 1 or 2 polyps is adenomatous. Repeat                            colonoscopy in 5 years if one polyp is adenomatous. Thornton Park MD, MD 10/06/2018 9:50:01 AM This report has been signed electronically.

## 2018-10-06 NOTE — Patient Instructions (Signed)
YOU HAD AN ENDOSCOPIC PROCEDURE TODAY AT THE [facility]:   Refer to the procedure report that was given to you for any specific questions about what was found during the examination.  If the procedure report does not answer your questions, please call your gastroenterologist to clarify.  If you requested that your care partner not be given the details of your procedure findings, then the procedure report has been included in a sealed envelope for you to review at your convenience later.  YOU SHOULD EXPECT: Some feelings of bloating in the abdomen. Passage of more gas than usual.  Walking can help get rid of the air that was put into your GI tract during the procedure and reduce the bloating. If you had a lower endoscopy (such as a colonoscopy or flexible sigmoidoscopy) you may notice spotting of blood in your stool or on the toilet paper. If you underwent a bowel prep for your procedure, you may not have a normal bowel movement for a few days.  Please Note:  You might notice some irritation and congestion in your nose or some drainage.  This is from the oxygen used during your procedure.  There is no need for concern and it should clear up in a day or so.  SYMPTOMS TO REPORT IMMEDIATELY:   Following lower endoscopy (colonoscopy or flexible sigmoidoscopy):  Excessive amounts of blood in the stool  Significant tenderness or worsening of abdominal pains  Swelling of the abdomen that is new, acute  Fever of 100F or higher  For urgent or emergent issues, a gastroenterologist can be reached at any hour by calling [phone].   DIET:  We do recommend a small meal at first, but then you may proceed to your regular diet.  Drink plenty of fluids but you should avoid alcoholic beverages for 24 hours.  MEDICATIONS: Continue present medications.  Please see handouts given to you by your recovery nurse.  ACTIVITY:  You should plan to take it easy for the rest of today and you should NOT  DRIVE or use heavy machinery until tomorrow (because of the sedation medicines used during the test).    FOLLOW UP: Our staff will call the number listed on your records the next business day following your procedure to check on you and address any questions or concerns that you may have regarding the information given to you following your procedure. If we do not reach you, we will leave a message.  However, if you are feeling well and you are not experiencing any problems, there is no need to return our call.  We will assume that you have returned to your regular daily activities without incident.  If any biopsies were taken you will be contacted by phone or by letter within the next 1-3 weeks.  Please call us at [phone] if you have not heard about the biopsies in 3 weeks.   Thank you for allowing us to provide for your healthcare needs today.   SIGNATURES/CONFIDENTIALITY: You and/or your care partner have signed paperwork which will be entered into your electronic medical record.  These signatures attest to the fact that that the information above on your After Visit Summary has been reviewed and is understood.  Full responsibility of the confidentiality of this discharge information lies with you and/or your care-partner. 

## 2018-10-07 ENCOUNTER — Telehealth: Payer: Self-pay

## 2018-10-07 NOTE — Telephone Encounter (Signed)
  Follow up Call-  Call back number 10/06/2018  Post procedure Call Back phone  # [phone]  Permission to leave phone message Yes  Some recent data might be hidden     Patient questions:  Do you have a fever, pain , or abdominal swelling? No. Pain Score  0 *  Have you tolerated food without any problems? Yes.    Have you been able to return to your normal activities? Yes.    Do you have any questions about your discharge instructions: Diet   No. Medications  No. Follow up visit  No.   Do you have questions or concerns about your Care? No.  Actions: * If pain score is 4 or above: No action needed, pain <4.

## 2018-10-14 ENCOUNTER — Encounter: Payer: Self-pay | Admitting: Gastroenterology

## 2018-10-17 ENCOUNTER — Other Ambulatory Visit: Payer: Self-pay | Admitting: *Deleted

## 2018-10-17 DIAGNOSIS — F320 Major depressive disorder, single episode, mild: Secondary | ICD-10-CM

## 2018-10-17 MED ORDER — ALBUTEROL SULFATE HFA 108 (90 BASE) MCG/ACT IN AERS: INHALATION_SPRAY | RESPIRATORY_TRACT | 4 refills | Status: DC

## 2018-10-17 MED ORDER — FLUTICASONE PROPIONATE 50 MCG/ACT NA SUSP: 2.0000 | Freq: Every day | NASAL | 4 refills | Status: DC

## 2018-10-17 MED ORDER — FLUTICASONE-SALMETEROL 100-50 MCG/DOSE IN AEPB: 1.0000 | INHALATION_SPRAY | Freq: Two times a day (BID) | RESPIRATORY_TRACT | 3 refills | Status: DC | PRN

## 2018-10-17 MED ORDER — BUPROPION HCL ER (XL) 150 MG PO TB24: 150.0000 mg | ORAL_TABLET | Freq: Every day | ORAL | 1 refills | Status: DC

## 2018-10-20 DIAGNOSIS — J4541 Moderate persistent asthma with (acute) exacerbation: Secondary | ICD-10-CM | POA: Diagnosis not present

## 2018-10-20 DIAGNOSIS — Z6821 Body mass index (BMI) 21.0-21.9, adult: Secondary | ICD-10-CM | POA: Diagnosis not present

## 2018-10-20 DIAGNOSIS — R05 Cough: Secondary | ICD-10-CM | POA: Diagnosis not present

## 2018-10-20 LAB — HM COLONOSCOPY

## 2018-10-25 MED ORDER — ALBUTEROL SULFATE HFA 108 (90 BASE) MCG/ACT IN AERS: INHALATION_SPRAY | RESPIRATORY_TRACT | 3 refills | Status: DC

## 2018-10-25 MED ORDER — FLUTICASONE PROPIONATE 50 MCG/ACT NA SUSP: 2.0000 | Freq: Every day | NASAL | 3 refills | Status: DC

## 2018-10-25 MED ORDER — FLUTICASONE-SALMETEROL 100-50 MCG/DOSE IN AEPB: 1.0000 | INHALATION_SPRAY | Freq: Two times a day (BID) | RESPIRATORY_TRACT | 3 refills | Status: DC | PRN

## 2018-11-03 ENCOUNTER — Other Ambulatory Visit: Payer: Self-pay | Admitting: Internal Medicine

## 2018-11-03 MED ORDER — DOXYCYCLINE HYCLATE 100 MG PO TABS: 100.0000 mg | ORAL_TABLET | Freq: Two times a day (BID) | ORAL | 5 refills | Status: DC

## 2018-11-03 MED ORDER — LORAZEPAM 0.5 MG PO TABS: 0.5000 mg | ORAL_TABLET | Freq: Three times a day (TID) | ORAL | 3 refills | Status: DC | PRN

## 2018-11-03 NOTE — Telephone Encounter (Signed)
Sending both doxy and lorazepam e-Rx

## 2018-11-03 NOTE — Telephone Encounter (Signed)
CDY- please see pt email below and advise if okay to refill   Hi there, [facility] on [address] does not have prescriptions for me for Doxycycline and Lorazepam. Could you call those in please?Your computers were down when I visited Dr Annamaria Boots in December. Thank you.  ----------------------------------------------------------  I will be happy to send the doxy, just need to know how many refills, but I think you have to send the other  Thanks

## 2019-01-06 ENCOUNTER — Encounter: Payer: Self-pay | Admitting: Internal Medicine

## 2019-01-06 NOTE — Telephone Encounter (Signed)
I have written her letter- on C pod. Please ask if she wants it mailed to her, or pick up.

## 2019-01-06 NOTE — Telephone Encounter (Signed)
Patient sent email this morning to CY To: Baird Lyons, MD Subject: Non-Urgent Medical Question  I know you health providers are up to your eyeballs.Thank you for all you do and take care of you. I am well, am sheltering in place at home.Unfortunately I requested to be dismissed from jury duty in April and my request was denied.Would it be possible for you to send me a quick note stating that I have increased risk to Covid-19 because of my age and my pulmonary condition?I could print it out and add it to my appeal.I have worked so hard to stay healthy and the thought of going into a pool of potential jurors scares me.  Forward email to CY this morning to advise. Waiting on response.

## 2019-01-06 NOTE — Telephone Encounter (Signed)
Patient is requesting we send the letter in the mail for patient in today's mail Will place letter in mail today Nothing further needed.

## 2019-02-08 DIAGNOSIS — Z20828 Contact with and (suspected) exposure to other viral communicable diseases: Secondary | ICD-10-CM

## 2019-02-08 DIAGNOSIS — Z20822 Contact with and (suspected) exposure to covid-19: Secondary | ICD-10-CM

## 2019-02-08 NOTE — Telephone Encounter (Signed)
Received the following email from patient:   "----- Message -----  From: Sharee Pimple  Sent: 02/08/2019 11:51 AM EDT  To: Baird Lyons, MD Subject: Non-Urgent Medical Question  Hi, I have been seeing multiple reports of a potential early spread of COVID-19.I wanted to make you aware of my history and request an antibody test when they become available to [facility].On Dec. 25 my son and his family flew in to [place], from [place] for a visit in my home.On December 29, I drove to [place], [place].On Dec. 30 I began coughing constantly (nonproductive), and slept and coughed with no appetite for 12 days.I was seen by a physician at [facility] in [facility] on Jan 2.I was not given a flu test but was put on Doxycycline. I began to feel better on January 11.In the three days after the onset of my illness, my friend, his son, and daughter-in-law developed a fever, exhaustion and coughing.My friend was sick for 2 weeks and tested negative for the flu on 1/2. The next week my daughter and her family (also at my house at Christmas) all had similar symptoms and tested negative for flu. I wonder if my son brought the virus from the [facility]. Thank you."  CY, please advise. Thanks!

## 2019-02-08 NOTE — Addendum Note (Signed)
Addended by: Valerie Salts on: 02/08/2019 03:13 PM   Modules accepted: Orders

## 2019-02-08 NOTE — Telephone Encounter (Signed)
Our lab can now do the Corona virus IgG antibody test. Ok to come by to have this drawn.  Order- lab [phone] Corona IgG  For dx Corona virus exposure  Reliability is uncertain with all of these tests. Can't exclude false positives or false negatives, but this is the best we currently can do.

## 2019-02-09 ENCOUNTER — Other Ambulatory Visit: Payer: Medicare Other

## 2019-02-09 DIAGNOSIS — Z20828 Contact with and (suspected) exposure to other viral communicable diseases: Secondary | ICD-10-CM | POA: Diagnosis not present

## 2019-02-09 DIAGNOSIS — Z20822 Contact with and (suspected) exposure to covid-19: Secondary | ICD-10-CM

## 2019-02-10 ENCOUNTER — Telehealth: Payer: Self-pay | Admitting: Internal Medicine

## 2019-02-10 LAB — SAR COV2 SEROLOGY (COVID19)AB(IGG),IA: SARS CoV2 AB IGG: NEGATIVE

## 2019-02-10 NOTE — Telephone Encounter (Signed)
Debbie Lever, MD  P Lbpu Triage Pool        Lab result- The antibody test for Covid exposure is negative, meaning we have no indication of Covid virus exposure. This test is what we have for now. It will take time to get confident about how reliable it is. It doesn't rule out the possibility of very recent exposure, since it seems to take about 10 days to develop antibodies.    Called and spoke with pt letting her know the results of the COVID antibody lab test pt expressed understanding. Nothing further needed.

## 2019-03-28 ENCOUNTER — Other Ambulatory Visit: Payer: Self-pay | Admitting: Internal Medicine

## 2019-03-28 DIAGNOSIS — F320 Major depressive disorder, single episode, mild: Secondary | ICD-10-CM

## 2019-06-20 ENCOUNTER — Other Ambulatory Visit: Payer: Self-pay

## 2019-06-20 DIAGNOSIS — R6889 Other general symptoms and signs: Secondary | ICD-10-CM | POA: Diagnosis not present

## 2019-06-20 DIAGNOSIS — Z20822 Contact with and (suspected) exposure to covid-19: Secondary | ICD-10-CM

## 2019-06-22 LAB — NOVEL CORONAVIRUS, NAA: SARS-CoV-2, NAA: NOT DETECTED

## 2019-06-22 NOTE — Progress Notes (Addendum)
Subjective:   Debbie Hogan is a 68 y.o. female who presents for Medicare Annual (Subsequent) preventive examination. I connected with patient by a telephone and verified that I am speaking with the correct person using two identifiers. Patient stated full name and DOB. Patient gave permission to continue with telephonic visit. Patient's location was at home and Nurse's location was at [place] office.   Review of Systems:   Cardiac Risk Factors include: advanced age (>74men, >64 women);dyslipidemia Sleep patterns: feels rested on waking, gets up 1 times nightly to void and sleeps 6-7 hours nightly.    Home Safety/Smoke Alarms: Feels safe in home. Smoke alarms in place.  Living environment; residence and Firearm Safety: 1-story house/ trailer. Lives alone, no needs for DME, good support system Seat Belt Safety/Bike Helmet: Wears seat belt.     Objective:     Vitals: There were no vitals taken for this visit.  There is no height or weight on file to calculate BMI.  Advanced Directives 06/27/2019 05/23/2017 02/02/2016 08/06/2015 02/09/2013  Does Patient Have a Medical Advance Directive? Yes Yes No Yes Patient has advance directive, copy not in chart  Type of Advance Directive [place];Living will [place];Living will - Living will [place];Living will  Copy of [place] in Chart? No - copy requested Yes - - Copy requested from family  Would patient like information on creating a medical advance directive? - - No - patient declined information - -  Pre-existing out of facility DNR order (yellow form or pink MOST form) - - - - No    Tobacco Social History   Tobacco Use  Smoking Status Former Smoker  . Quit date: 10/19/1980  . Years since quitting: 38.7  Smokeless Tobacco Never Used  Tobacco Comment   Positive history of passive tobacco smoke exposure until 1990.Smoked [phone], up to 1/2 ppd     Counseling  given: Not Answered Comment: Positive history of passive tobacco smoke exposure until 1990.Smoked [phone], up to 1/2 ppd  Past Medical History:  Diagnosis Date  . 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 Anxiety    PMH of  . Colonic polyp 2008  . DVT (deep venous thrombosis) ([place])    LLE 2007 (post trauma and prolonged air travel), Lovenox x 6 months  . Hyperlipemia    see NMR data  . Hypertension   . Kidney stone    X1; spontaneous passgae  . Migraine   . MVP (mitral valve prolapse)    Dr.Brackbill;Cardiology   Past Surgical History:  Procedure Laterality Date  . COLONOSCOPY  last 11/21/2007   Dr.Medoff-hyperplastic polyp x 1   . COLONOSCOPY W/ POLYPECTOMY  2009   Dr Earle Gell ,[facility]  . DILATION AND CURETTAGE OF UTERUS  2014   neg pathology  . NASAL SINUS SURGERY  2009   polyps;Dr.Rosen  . OOPHORECTOMY     3  benign cysts  . TONSILLECTOMY     Family History  Problem Relation Age of Onset  . Dementia Father   . Asthma Father   . Depression Father   . Alcohol abuse Father   . Hypotension Father   . Hypertension Mother        MGM   . Depression Mother   . Hypertension Sister   . Alcohol abuse Sister   . Colon polyps Sister   . Depression Sister        X  2  . Alcohol abuse Sister   . Alzheimer's disease Paternal Uncle   . Ovarian cancer Maternal Aunt   . Alcohol abuse Sister        X 2  . Diabetes Neg Hx   . Stroke Neg Hx   . Heart attack Neg Hx   . Cancer Neg Hx   . Early death Neg Hx   . Colon cancer Neg Hx   . Esophageal cancer Neg Hx   . Rectal cancer Neg Hx   . Stomach cancer Neg Hx    Social History   Socioeconomic History  . Marital status: Widowed    Spouse name: Not on file  . Number of children: 2  . Years of education: Not on file  . Highest education level: Not on file  Occupational History  . Occupation: retired  Scientific laboratory technician  . Financial resource strain: Not hard at all  . Food insecurity     Worry: Never true    Inability: Never true  . Transportation needs    Medical: No    Non-medical: No  Tobacco Use  . Smoking status: Former Smoker    Quit date: 10/19/1980    Years since quitting: 38.7  . Smokeless tobacco: Never Used  . Tobacco comment: Positive history of passive tobacco smoke exposure until 1990.Smoked [phone], up to 1/2 ppd  Substance and Sexual Activity  . Alcohol use: Yes    Alcohol/week: 5.0 standard drinks    Types: 5 Glasses of wine per week    Comment: Social alcohol    . Drug use: No  . Sexual activity: Not Currently  Lifestyle  . Physical activity    Days per week: 6 days    Minutes per session: 40 min  . Stress: Only a little  Relationships  . Social connections    Talks on phone: More than three times a week    Gets together: More than three times a week    Attends religious service: 1 to 4 times per year    Active member of club or organization: Yes    Attends meetings of clubs or organizations: More than 4 times per year    Relationship status: Widowed  Other Topics Concern  . Not on file  Social History Narrative   widowed    Outpatient Encounter Medications as of 06/27/2019  Medication Sig  . albuterol (PROAIR HFA) 108 (90 Base) MCG/ACT inhaler USE 2 INHALATIONS INTO THE LUNGS  EVERY 6 HOURS AS NEEDED FOR WHEEZING OR SHORTNESS OF BREATH  . buPROPion (WELLBUTRIN XL) 150 MG 24 hr tablet TAKE 1 TABLET DAILY  . calcium citrate-vitamin D (CITRACAL+D) 315-200 MG-UNIT per tablet Take 1 tablet by mouth every other day.   Marland Kitchen doxycycline (VIBRA-TABS) 100 MG tablet Take 1 tablet (100 mg total) by mouth 2 (two) times daily.  Marland Kitchen estradiol (VIVELLE-DOT) 0.0375 MG/24HR Place 1 patch onto the skin 2 (two) times a week. Place on Monday and Thursday  . fluticasone (FLONASE) 50 MCG/ACT nasal spray Place 2 sprays into both nostrils daily.  . Fluticasone-Salmeterol (ADVAIR DISKUS) 100-50 MCG/DOSE AEPB Inhale 1 puff into the lungs 2 (two) times daily as  needed.  Marland Kitchen LORazepam (ATIVAN) 0.5 MG tablet Take 1 tablet (0.5 mg total) by mouth every 8 (eight) hours as needed for anxiety.  Marland Kitchen losartan (COZAAR) 100 MG tablet TAKE ONE-HALF (1/2) TABLET DAILY  . [DISCONTINUED] Omega-3 Fatty Acids (FISH OIL PO) Take by mouth.  . [DISCONTINUED] TURMERIC PO Take 482  mg by mouth daily.   Facility-Administered Encounter Medications as of 06/27/2019  Medication  . 0.9 %  sodium chloride infusion    Activities of Daily Living In your present state of health, do you have any difficulty performing the following activities: 06/27/2019  Hearing? N  Vision? N  Difficulty concentrating or making decisions? N  Walking or climbing stairs? N  Dressing or bathing? N  Doing errands, shopping? N  Preparing Food and eating ? N  Using the Toilet? N  In the past six months, have you accidently leaked urine? N  Do you have problems with loss of bowel control? N  Managing your Medications? N  Managing your Finances? N  Housekeeping or managing your Housekeeping? N  Some recent data might be hidden    Patient Care Team: Janith Lima, MD as PCP - General (Internal Medicine)    Assessment:   This is a routine wellness examination for Debbie Hogan. Physical assessment deferred to PCP.  Exercise Activities and Dietary recommendations Current Exercise Habits: Home exercise routine;Structured exercise class, Type of exercise: yoga;walking, Time (Minutes): 50, Frequency (Times/Week): 6, Weekly Exercise (Minutes/Week): 300, Intensity: Mild  Diet (meal preparation, eat out, water intake, caffeinated beverages, dairy products, fruits and vegetables): in general, a "healthy" diet  , well balanced. eats a variety of fruits and vegetables daily, limits salt, fat/cholesterol, sugar,carbohydrates,caffeine, drinks 6-8 glasses of water daily.  Goals   None     Fall Risk Fall Risk  06/27/2019 09/08/2018 08/11/2017 05/23/2017 05/19/2017  Falls in the past year? 0 0 No No No  Comment -  Emmi Telephone Survey: data to providers prior to load - - -  Number falls in past yr: 0 - - - -    Depression Screen PHQ 2/9 Scores 06/27/2019 06/07/2018 05/23/2017 05/19/2017  PHQ - 2 Score 0 0 0 1  PHQ- 9 Score 1 3 - 2     Cognitive Function       Ad8 score reviewed for issues:  Issues making decisions: no  Less interest in hobbies / activities: no  Repeats questions, stories (family complaining): no  Trouble using ordinary gadgets (microwave, computer, phone):no  Forgets the month or year: no  Mismanaging finances: no  Remembering appts: no  Daily problems with thinking and/or memory: no Ad8 score is= 0  Immunization History  Administered Date(s) Administered  . Hep A / Hep B 06/25/2005, 07/23/2005, 10/22/2008  . IPV 06/25/2005  . Influenza Split 08/09/2012, 09/07/2017, 09/05/2018  . Influenza Whole 07/11/2008, 07/19/2010, 07/20/2011  . Influenza,inj,Quad PF,6+ Mos 08/28/2013, 10/22/2014  . Influenza-Unspecified 08/23/2015, 08/12/2016  . Meningococcal Polysaccharide 11/25/2012  . Pneumococcal Conjugate-13 09/19/2015  . Pneumococcal Polysaccharide-23 10/20/2007, 05/01/2016  . Td 10/20/2003  . Tdap 11/25/2012  . Typhoid Inactivated 10/22/2008, 10/22/2017  . Yellow Fever 06/25/2005  . Zoster 07/19/2012   Screening Tests Health Maintenance  Topic Date Due  . INFLUENZA VACCINE  05/20/2019  . MAMMOGRAM  05/24/2020  . PNA vac Low Risk Adult (2 of 2 - PPSV23) 05/01/2021  . TETANUS/TDAP  11/25/2022  . COLONOSCOPY  10/21/2023  . DEXA SCAN  Completed  . Hepatitis C Screening  Completed      Plan:     Reviewed health maintenance screenings with patient today and relevant education, vaccines, and/or referrals were provided.   Continue to eat heart healthy diet (full of fruits, vegetables, whole grains, lean protein, water--limit salt, fat, and sugar intake) and increase physical activity as tolerated.  Continue doing brain stimulating activities (  puzzles, reading,  adult coloring books, staying active) to keep memory sharp.   I have personally reviewed and noted the following in the patient's chart:   . Medical and social history . Use of alcohol, tobacco or illicit drugs  . Current medications and supplements . Functional ability and status . Nutritional status . Physical activity . Advanced directives . List of other physicians . Screenings to include cognitive, depression, and falls . Referrals and appointments  In addition, I have reviewed and discussed with patient certain preventive protocols, quality metrics, and best practice recommendations. A written personalized care plan for preventive services as well as general preventive health recommendations were provided to patient.     Michiel Cowboy, RN  06/27/2019   Medical screening examination/treatment/procedure(s) were performed by non-physician practitioner and as supervising physician I was immediately available for consultation/collaboration. I agree with above. Scarlette Calico, MD

## 2019-06-27 ENCOUNTER — Ambulatory Visit (INDEPENDENT_AMBULATORY_CARE_PROVIDER_SITE_OTHER): Payer: Medicare Other | Admitting: *Deleted

## 2019-06-27 DIAGNOSIS — Z0000 Encounter for general adult medical examination without abnormal findings: Secondary | ICD-10-CM | POA: Diagnosis not present

## 2019-06-29 ENCOUNTER — Ambulatory Visit (INDEPENDENT_AMBULATORY_CARE_PROVIDER_SITE_OTHER): Payer: Medicare Other | Admitting: Internal Medicine

## 2019-06-29 ENCOUNTER — Other Ambulatory Visit (INDEPENDENT_AMBULATORY_CARE_PROVIDER_SITE_OTHER): Payer: Medicare Other

## 2019-06-29 ENCOUNTER — Encounter: Payer: Self-pay | Admitting: Internal Medicine

## 2019-06-29 ENCOUNTER — Other Ambulatory Visit: Payer: Self-pay

## 2019-06-29 VITALS — BP 130/80 | HR 69 | Temp 98.2°F | Resp 16 | Ht 65.5 in | Wt 133.0 lb

## 2019-06-29 DIAGNOSIS — I10 Essential (primary) hypertension: Secondary | ICD-10-CM | POA: Diagnosis not present

## 2019-06-29 DIAGNOSIS — D473 Essential (hemorrhagic) thrombocythemia: Secondary | ICD-10-CM | POA: Diagnosis not present

## 2019-06-29 DIAGNOSIS — E785 Hyperlipidemia, unspecified: Secondary | ICD-10-CM | POA: Diagnosis not present

## 2019-06-29 DIAGNOSIS — H6123 Impacted cerumen, bilateral: Secondary | ICD-10-CM

## 2019-06-29 DIAGNOSIS — D75839 Thrombocytosis, unspecified: Secondary | ICD-10-CM

## 2019-06-29 DIAGNOSIS — Z23 Encounter for immunization: Secondary | ICD-10-CM | POA: Diagnosis not present

## 2019-06-29 DIAGNOSIS — E559 Vitamin D deficiency, unspecified: Secondary | ICD-10-CM

## 2019-06-29 LAB — LIPID PANEL
Cholesterol: 273 mg/dL — ABNORMAL HIGH (ref 0–200)
HDL: 72.8 mg/dL (ref >39.00)
LDL Cholesterol: 181 mg/dL — ABNORMAL HIGH (ref 0–99)
NonHDL: 199.9
Total CHOL/HDL Ratio: 4
Triglycerides: 93 mg/dL (ref 0.0–149.0)
VLDL: 18.6 mg/dL (ref 0.0–40.0)

## 2019-06-29 LAB — CBC WITH DIFFERENTIAL/PLATELET
Basophils Absolute: 0.1 10*3/uL (ref 0.0–0.1)
Basophils Relative: 1.5 % (ref 0.0–3.0)
Eosinophils Absolute: 0.7 10*3/uL (ref 0.0–0.7)
Eosinophils Relative: 10.2 % — ABNORMAL HIGH (ref 0.0–5.0)
HCT: 44.2 % (ref 36.0–46.0)
Hemoglobin: 15 g/dL (ref 12.0–15.0)
Lymphocytes Relative: 30.4 % (ref 12.0–46.0)
Lymphs Abs: 2.1 10*3/uL (ref 0.7–4.0)
MCHC: 33.9 g/dL (ref 30.0–36.0)
MCV: 92 fl (ref 78.0–100.0)
Monocytes Absolute: 0.5 10*3/uL (ref 0.1–1.0)
Monocytes Relative: 6.9 % (ref 3.0–12.0)
Neutro Abs: 3.5 10*3/uL (ref 1.4–7.7)
Neutrophils Relative %: 51 % (ref 43.0–77.0)
Platelets: 391 10*3/uL (ref 150.0–400.0)
RBC: 4.8 Mil/uL (ref 3.87–5.11)
RDW: 13.2 % (ref 11.5–15.5)
WBC: 6.8 10*3/uL (ref 4.0–10.5)

## 2019-06-29 LAB — HEPATIC FUNCTION PANEL
ALT: 11 U/L (ref 0–35)
AST: 17 U/L (ref 0–37)
Albumin: 4.2 g/dL (ref 3.5–5.2)
Alkaline Phosphatase: 43 U/L (ref 39–117)
Bilirubin, Direct: 0.1 mg/dL (ref 0.0–0.3)
Total Bilirubin: 0.6 mg/dL (ref 0.2–1.2)
Total Protein: 7.2 g/dL (ref 6.0–8.3)

## 2019-06-29 LAB — BASIC METABOLIC PANEL
BUN: 13 mg/dL (ref 6–23)
CO2: 30 mEq/L (ref 19–32)
Calcium: 9.4 mg/dL (ref 8.4–10.5)
Chloride: 102 mEq/L (ref 96–112)
Creatinine, Ser: 0.65 mg/dL (ref 0.40–1.20)
GFR: 90.73 mL/min (ref >60.00)
Glucose, Bld: 92 mg/dL (ref 70–99)
Potassium: 4.2 mEq/L (ref 3.5–5.1)
Sodium: 139 mEq/L (ref 135–145)

## 2019-06-29 LAB — IBC PANEL
Iron: 125 ug/dL (ref 42–145)
Saturation Ratios: 38.8 % (ref 20.0–50.0)
Transferrin: 230 mg/dL (ref 212.0–360.0)

## 2019-06-29 LAB — TSH: TSH: 1.36 u[IU]/mL (ref 0.35–4.50)

## 2019-06-29 LAB — FERRITIN: Ferritin: 57.9 ng/mL (ref 10.0–291.0)

## 2019-06-29 LAB — VITAMIN D 25 HYDROXY (VIT D DEFICIENCY, FRACTURES): VITD: 59.28 ng/mL (ref 30.00–100.00)

## 2019-06-29 MED ORDER — ZOSTER VAC RECOMB ADJUVANTED 50 MCG/0.5ML IM SUSR: 0.5000 mL | Freq: Once | INTRAMUSCULAR | 1 refills | Status: AC

## 2019-06-29 NOTE — Progress Notes (Signed)
Patient consent obtained. Irrigation with water and peroxide performed. Full view of bilateral tympanic membranes after procedure.  Patient tolerated procedure well.   

## 2019-06-29 NOTE — Progress Notes (Addendum)
Subjective:  Patient ID: Debbie Hogan, female    DOB: July 04, 1951  Age: 68 y.o. MRN: [id number]  CC: Hypertension and Hyperlipidemia   HPI Beneta Weichman presents for f/up - She tells me her blood pressure has been well controlled.  She is active and denies any recent episodes of headache, blurred vision, chest pain, shortness of breath, palpitations, edema, or fatigue.  She has a history of an elevated platelet count.  She denies any recent episodes of bleeding or bruising.  She complains of a several week history of declining hearing on both sides.  Outpatient Medications Prior to Visit  Medication Sig Dispense Refill  . albuterol (PROAIR HFA) 108 (90 Base) MCG/ACT inhaler USE 2 INHALATIONS INTO THE LUNGS  EVERY 6 HOURS AS NEEDED FOR WHEEZING OR SHORTNESS OF BREATH 3 Inhaler 3  . buPROPion (WELLBUTRIN XL) 150 MG 24 hr tablet TAKE 1 TABLET DAILY 90 tablet 3  . calcium citrate-vitamin D (CITRACAL+D) 315-200 MG-UNIT per tablet Take 1 tablet by mouth every other day.     Marland Kitchen doxycycline (VIBRA-TABS) 100 MG tablet Take 1 tablet (100 mg total) by mouth 2 (two) times daily. 14 tablet 5  . estradiol (VIVELLE-DOT) 0.0375 MG/24HR Place 1 patch onto the skin 2 (two) times a week. Place on Monday and Thursday    . fluticasone (FLONASE) 50 MCG/ACT nasal spray Place 2 sprays into both nostrils daily. 48 g 3  . Fluticasone-Salmeterol (ADVAIR DISKUS) 100-50 MCG/DOSE AEPB Inhale 1 puff into the lungs 2 (two) times daily as needed. 3 each 3  . LORazepam (ATIVAN) 0.5 MG tablet Take 1 tablet (0.5 mg total) by mouth every 8 (eight) hours as needed for anxiety. 90 tablet 3  . losartan (COZAAR) 100 MG tablet TAKE ONE-HALF (1/2) TABLET DAILY 90 tablet 1   Facility-Administered Medications Prior to Visit  Medication Dose Route Frequency Provider Last Rate Last Dose  . 0.9 %  sodium chloride infusion  500 mL Intravenous Once [place], MD        ROS Review of Systems  Constitutional: Negative for  diaphoresis and fatigue.  HENT: Positive for hearing loss. Negative for ear pain, sinus pressure, sore throat and trouble swallowing.   Eyes: Negative for visual disturbance.  Respiratory: Negative for cough, chest tightness and shortness of breath.   Cardiovascular: Negative for chest pain, palpitations and leg swelling.  Gastrointestinal: Negative for abdominal pain, constipation, diarrhea, nausea and vomiting.  Endocrine: Negative.   Genitourinary: Negative.  Negative for hematuria.  Musculoskeletal: Negative for arthralgias and myalgias.  Skin: Negative.  Negative for color change.  Neurological: Negative for dizziness, weakness and light-headedness.  Hematological: Negative for adenopathy. Does not bruise/bleed easily.  Psychiatric/Behavioral: Negative.     Objective:  BP 130/80 (BP Location: Left Arm, Patient Position: Sitting, Cuff Size: Normal)   Pulse 69   Temp 98.2 F (36.8 C) (Oral)   Resp 16   Ht 5' 5.5" (1.664 m)   Wt 133 lb (60.3 kg)   SpO2 97%   BMI 21.80 kg/m   BP Readings from Last 3 Encounters:  06/29/19 130/80  10/06/18 113/60  09/29/18 110/66    Wt Readings from Last 3 Encounters:  06/29/19 133 lb (60.3 kg)  10/06/18 134 lb (60.8 kg)  09/30/18 134 lb (60.8 kg)    Physical Exam Vitals signs reviewed.  Constitutional:      Appearance: Normal appearance.  HENT:     Right Ear: Tympanic membrane, ear canal and external ear normal. Decreased hearing noted.  There is impacted cerumen.     Left Ear: Tympanic membrane, ear canal and external ear normal. Decreased hearing noted. There is impacted cerumen.     Ears:     Comments: I put Colace in both ears and then irrigated it with an ear pick.  The cerumen was successfully removed and her hearing has returned to normal.  The exam afterwards is normal and she tolerated the procedure well.    Nose: Nose normal.     Mouth/Throat:     Mouth: Mucous membranes are moist.  Eyes:     General: No scleral icterus.     Conjunctiva/sclera: Conjunctivae normal.  Neck:     Musculoskeletal: Normal range of motion and neck supple.  Cardiovascular:     Rate and Rhythm: Normal rate and regular rhythm.     Heart sounds: No murmur.  Pulmonary:     Effort: Pulmonary effort is normal. No respiratory distress.     Breath sounds: No stridor. No wheezing, rhonchi or rales.  Abdominal:     General: Abdomen is flat. Bowel sounds are normal. There is no distension.     Palpations: Abdomen is soft. There is no hepatomegaly or splenomegaly.     Tenderness: There is no abdominal tenderness.  Musculoskeletal: Normal range of motion.     Right lower leg: No edema.     Left lower leg: No edema.  Lymphadenopathy:     Cervical: No cervical adenopathy.  Skin:    General: Skin is warm and dry.  Neurological:     General: No focal deficit present.     Mental Status: She is alert and oriented to person, place, and time. Mental status is at baseline.     Lab Results  Component Value Date   WBC 6.8 06/29/2019   HGB 15.0 06/29/2019   HCT 44.2 06/29/2019   PLT 391.0 06/29/2019   GLUCOSE 92 06/29/2019   CHOL 273 (H) 06/29/2019   TRIG 93.0 06/29/2019   HDL 72.80 06/29/2019   LDLDIRECT 133.7 01/08/2010   LDLCALC 181 (H) 06/29/2019   ALT 11 06/29/2019   AST 17 06/29/2019   NA 139 06/29/2019   K 4.2 06/29/2019   CL 102 06/29/2019   CREATININE 0.65 06/29/2019   BUN 13 06/29/2019   CO2 30 06/29/2019   TSH 1.36 06/29/2019   HGBA1C 5.5 02/07/2013    No results found.  Assessment & Plan:   Shaunette was seen today for hypertension and hyperlipidemia.  Diagnoses and all orders for this visit:  Hyperlipidemia with target LDL less than 130- She has a mildly elevated ASCVD risk score.  She has a history of intolerance to atorvastatin.  I have asked her to try pitavastatin as it is known to have a better side effect profile. -     Lipid panel; Future -     TSH; Future -     Hepatic function panel; Future -      Pitavastatin Calcium (LIVALO) 2 MG TABS; Take 1 tablet (2 mg total) by mouth daily.  Vitamin D deficiency -     VITAMIN D 25 Hydroxy (Vit-D Deficiency, Fractures); Future  Thrombocytosis ([place])- Her platelet count is normal now.  Labs to screen for secondary causes are normal. -     CBC with Differential/Platelet; Future -     Basic metabolic panel; Future -     IBC panel; Future -     Ferritin; Future -     Hepatic function panel; Future  Need for influenza vaccination -     Flu Vaccine QUAD High Dose(Fluad)  Need for shingles vaccine -     Zoster Vaccine Adjuvanted [facility]) injection; Inject 0.5 mLs into the muscle once for 1 dose.   I am having Nakya Rogerson "Sonia Baller" start on Zoster Vaccine Adjuvanted and Livalo. I am also having her maintain her calcium citrate-vitamin D, estradiol, losartan, fluticasone, Fluticasone-Salmeterol, albuterol, doxycycline, LORazepam, and buPROPion. We will continue to administer sodium chloride.  Meds ordered this encounter  Medications  . Zoster Vaccine Adjuvanted [facility]) injection    Sig: Inject 0.5 mLs into the muscle once for 1 dose.    Dispense:  0.5 mL    Refill:  1  . Pitavastatin Calcium (LIVALO) 2 MG TABS    Sig: Take 1 tablet (2 mg total) by mouth daily.    Dispense:  90 tablet    Refill:  1     Follow-up: No follow-ups on file.  Scarlette Calico, MD

## 2019-06-30 ENCOUNTER — Encounter: Payer: Self-pay | Admitting: Internal Medicine

## 2019-06-30 MED ORDER — LIVALO 2 MG PO TABS: 1.0000 | ORAL_TABLET | Freq: Every day | ORAL | 1 refills | Status: DC

## 2019-06-30 NOTE — Assessment & Plan Note (Signed)
Her blood pressure is adequately well-controlled 

## 2019-07-01 DIAGNOSIS — H6123 Impacted cerumen, bilateral: Secondary | ICD-10-CM | POA: Insufficient documentation

## 2019-07-04 NOTE — Telephone Encounter (Signed)
Key: [id number]

## 2019-07-07 ENCOUNTER — Other Ambulatory Visit: Payer: Self-pay | Admitting: Internal Medicine

## 2019-07-07 DIAGNOSIS — E785 Hyperlipidemia, unspecified: Secondary | ICD-10-CM

## 2019-07-07 MED ORDER — LIVALO 2 MG PO TABS: 1.0000 | ORAL_TABLET | Freq: Every day | ORAL | 1 refills | Status: DC

## 2019-07-14 DIAGNOSIS — K625 Hemorrhage of anus and rectum: Secondary | ICD-10-CM | POA: Diagnosis not present

## 2019-07-14 DIAGNOSIS — Z6822 Body mass index (BMI) 22.0-22.9, adult: Secondary | ICD-10-CM | POA: Diagnosis not present

## 2019-07-14 DIAGNOSIS — Z1231 Encounter for screening mammogram for malignant neoplasm of breast: Secondary | ICD-10-CM | POA: Diagnosis not present

## 2019-07-14 DIAGNOSIS — Z01419 Encounter for gynecological examination (general) (routine) without abnormal findings: Secondary | ICD-10-CM | POA: Diagnosis not present

## 2019-07-14 DIAGNOSIS — N959 Unspecified menopausal and perimenopausal disorder: Secondary | ICD-10-CM | POA: Diagnosis not present

## 2019-08-03 ENCOUNTER — Encounter: Payer: Self-pay | Admitting: Gastroenterology

## 2019-08-03 ENCOUNTER — Ambulatory Visit (INDEPENDENT_AMBULATORY_CARE_PROVIDER_SITE_OTHER): Payer: Medicare Other | Admitting: Gastroenterology

## 2019-08-03 VITALS — BP 142/94 | HR 66 | Temp 98.2°F | Ht 65.0 in | Wt 134.0 lb

## 2019-08-03 DIAGNOSIS — K625 Hemorrhage of anus and rectum: Secondary | ICD-10-CM

## 2019-08-03 DIAGNOSIS — R6889 Other general symptoms and signs: Secondary | ICD-10-CM

## 2019-08-03 NOTE — Progress Notes (Signed)
Error

## 2019-08-03 NOTE — Progress Notes (Signed)
Referring Provider: Janith Lima, MD Primary Care Physician:  Janith Lima, MD  Reason for Consultation:  Blood in the stools   IMPRESSION:  Intermittent rectal bleeding occurring primarily with exercise Hemorrhoids on rectal exam Small, pearly perianal papule of unclear clinical significance.  Sessile serrated polyp on screening colonoscopy 10/06/2018    - no polyps on colonoscopy at [facility] in 2009    - Surveillance colonoscopy recommended in 2024  Rectal bleeding may be due to hemorrhoids, exericise-induced colitis, or possibly exercise-induced bleeding from a rectocele.  Her symptoms sound atypical for ischemia.  We will maximize conservative therapy in an effort to minimize symptoms.  We will refer to Dr. Dema Severin for further evaluation of the perianal papule.  PLAN: - Conservative therapy for hemorrhoids recommended including adequate water intake, high-fiber diet, a daily stool bulking agent such as psyllium, sitz bath's, and Anusol HC - Avoid all NSAIDs - Referral to Dr. Nadeen Landau - Follow-up after her consultation with Dr. Dema Severin - Surveillance colonoscopy 2024  Please see the "Patient Instructions" section for addition details about the plan.  HPI: Debbie Hogan is a 68 y.o. female . We initially met at the time of a screening colonoscopy 10/06/2018.  She had a 4 mm sessile polyp removed from the hepatic flexure and 2 hyperplastic polyps removed from the rectum.  She returns now at her request to discuss a 65-monthhistory of intermittent rectal bleeding.  Walks 3-4 miles every day. She feels a sense of urgency and irritation in her rectum. By the time she is home there is blood on top of the stool. Has happened 4 times over the last 4-5 months.   Has had similar symptoms with bleeding and associated urgency twice when drinking wine not related to any exercise.  Rare AlkaSeltzer chewable when she eats a sandwich due to gluten sensitivity.  Denies the use of any  other NSAIDs.  Has a cup of coffee daily followed by a soft, formed BM daily. She feels like it's smaller than diameter than when she was young. Less density.   Her PCP was concerned about possible hemorrhoids.   Sister with colon polyps.  No other known family history of colon cancer or polyps. No family history of uterine/endometrial cancer, pancreatic cancer or gastric/stomach cancer.   Past Medical History:  Diagnosis Date  . [place]   with eosinophilia  . Anxiety    PMH of  . Colonic polyp 2008  . [place]    [place] 2007 (post trauma and prolonged air travel), Lovenox x 6 months  . Hyperlipemia    see NMR data  . Hypertension   . Kidney stone    X1; spontaneous passgae  . Migraine   . MVP (mitral valve prolapse)    Dr.Brackbill;Cardiology    Past Surgical History:  Procedure Laterality Date  . COLONOSCOPY  last 11/21/2007   Dr.Medoff-hyperplastic polyp x 1   . COLONOSCOPY W/ POLYPECTOMY  2009   Dr MEarle Gell,[facility]  . DILATION AND CURETTAGE OF UTERUS  2014   neg pathology  . NASAL SINUS SURGERY  2009   polyps;Dr.Rosen  . OOPHORECTOMY     3  benign cysts  . TONSILLECTOMY      Current Outpatient Medications  Medication Sig Dispense Refill  . albuterol (PROAIR HFA) 108 (90 Base) MCG/ACT inhaler USE 2 INHALATIONS INTO THE LUNGS  EVERY 6 HOURS AS NEEDED FOR WHEEZING OR SHORTNESS OF BREATH 3  Inhaler 3  . buPROPion (WELLBUTRIN XL) 150 MG 24 hr tablet TAKE 1 TABLET DAILY 90 tablet 3  . calcium citrate-vitamin D (CITRACAL+D) 315-200 MG-UNIT per tablet Take 1 tablet by mouth every other day.     Marland Kitchen doxycycline (VIBRA-TABS) 100 MG tablet Take 1 tablet (100 mg total) by mouth 2 (two) times daily. 14 tablet 5  . estradiol (VIVELLE-DOT) 0.0375 MG/24HR Place 1 patch onto the skin 2 (two) times a week. Place on Monday and Thursday    . fluticasone (FLONASE) 50 MCG/ACT nasal spray Place 2 sprays into both  nostrils daily. 48 g 3  . Fluticasone-Salmeterol (ADVAIR DISKUS) 100-50 MCG/DOSE AEPB Inhale 1 puff into the lungs 2 (two) times daily as needed. 3 each 3  . guaiFENesin (MUCINEX) 600 MG 12 hr tablet Take 600 mg by mouth daily.    Marland Kitchen LORazepam (ATIVAN) 0.5 MG tablet Take 1 tablet (0.5 mg total) by mouth every 8 (eight) hours as needed for anxiety. 90 tablet 3  . losartan (COZAAR) 100 MG tablet TAKE ONE-HALF (1/2) TABLET DAILY 90 tablet 1  . Pitavastatin Calcium (LIVALO) 2 MG TABS Take 1 tablet (2 mg total) by mouth daily. 90 tablet 1   No current facility-administered medications for this visit.     Allergies as of 08/03/2019 - Review Complete 08/03/2019  Allergen Reaction Noted  . Atorvastatin Other (See Comments) 06/08/2018  . Kiwi extract Other (See Comments) 06/05/2014  . Other Other (See Comments) 06/05/2014  . Montelukast sodium Palpitations     Family History  Problem Relation Age of Onset  . Dementia Father   . Asthma Father   . Depression Father   . Alcohol abuse Father   . Hypotension Father   . Hypertension Mother        MGM   . Depression Mother   . Hypertension Sister   . Alcohol abuse Sister   . Colon polyps Sister   . Depression Sister        X 2  . Alcohol abuse Sister   . Alzheimer's disease Paternal Uncle   . Ovarian cancer Maternal Aunt   . Alcohol abuse Sister        X 2  . Diabetes Neg Hx   . Stroke Neg Hx   . Heart attack Neg Hx   . Cancer Neg Hx   . Early death Neg Hx   . Colon cancer Neg Hx   . Esophageal cancer Neg Hx   . Rectal cancer Neg Hx   . Stomach cancer Neg Hx     Social History   Socioeconomic History  . Marital status: Widowed    Spouse name: Not on file  . Number of children: 2  . Years of education: Not on file  . Highest education level: Not on file  Occupational History  . Occupation: retired  Scientific laboratory technician  . Financial resource strain: Not hard at all  . Food insecurity    Worry: Never true    Inability: Never true   . Transportation needs    Medical: No    Non-medical: No  Tobacco Use  . Smoking status: Former Smoker    Quit date: 10/19/1980    Years since quitting: 38.8  . Smokeless tobacco: Never Used  . Tobacco comment: Positive history of passive tobacco smoke exposure until 1990.Smoked [phone], up to 1/2 ppd  Substance and Sexual Activity  . Alcohol use: Yes    Alcohol/week: 5.0 standard drinks    Types: 5  Glasses of wine per week    Comment: Social alcohol    . Drug use: No  . Sexual activity: Not Currently  Lifestyle  . Physical activity    Days per week: 6 days    Minutes per session: 40 min  . Stress: Only a little  Relationships  . Social connections    Talks on phone: More than three times a week    Gets together: More than three times a week    Attends religious service: 1 to 4 times per year    Active member of club or organization: Yes    Attends meetings of clubs or organizations: More than 4 times per year    Relationship status: Widowed  . Intimate partner violence    Fear of current or ex partner: Not on file    Emotionally abused: Not on file    Physically abused: Not on file    Forced sexual activity: Not on file  Other Topics Concern  . Not on file  Social History Narrative   widowed   Physical Exam: General:   Alert,  well-nourished, pleasant and cooperative in NAD Head:  Normocephalic and atraumatic. Eyes:  Sclera clear, no icterus.   Conjunctiva pink. Ears:  Normal auditory acuity. Nose:  No deformity, discharge,  or lesions. Mouth:  No deformity or lesions.   Neck:  Supple; no masses or thyromegaly. Lungs:  Clear throughout to auscultation.   No wheezes. Heart:  Regular rate and rhythm; no murmurs. Abdomen:  Soft,nontender, nondistended, normal bowel sounds, no rebound or guarding. No hepatosplenomegaly.   Rectal:   No chemical dermatitis. External hemorrhoids seen. Small pearly papule of unclear clinical significance identified. No obvious fissure or  fistula. Posterior skin tag.  No prolapsing hemorrhoids. No rectal prolapse. Normal anocutaneous reflex. No stool in the rectal vault. No mass or fecal impaction. Normal anal resting tone.  Chaperone: Desiree Msk:  Symmetrical. No boney deformities LAD: No inguinal or umbilical LAD Extremities:  No clubbing or edema. Neurologic:  Alert and  oriented x4;  grossly nonfocal Skin:  Intact without significant lesions or rashes. Psych:  Alert and cooperative. Normal mood and affect.      Erion Hermans L. Tarri Glenn, MD, MPH 08/03/2019, 10:12 AM

## 2019-08-03 NOTE — Patient Instructions (Addendum)
Make sure that you are well hydrated. Drink at least 1.5-2 liters of water every day.  Be careful about using anti-inflammatory medications, such as ibuprofen or aspirin, as these medications may contribute to the bleeding.  High fiber diet recommended.  I recommend adding a daily stool bulking agent with psyllium or methylcellulose.  I have prescribed  Anusol HC 2.5% applied sparingly to your rectum twice daily.   Sitz Baths may provide some additional relief.  I would like for you to see Dr. Nadeen Landau.  Let's plan to follow-up again after that appointment.    How to Take a [employer] A sitz bath is a warm water bath that may be used to care for your rectum, genital area, or the area between your rectum and genitals (perineum). For a sitz bath, the water only comes up to your hips and covers your buttocks. A sitz bath may done at home in a bathtub or with a portable sitz bath that fits over the toilet. Your health care provider may recommend a sitz bath to help:  Relieve pain and discomfort after delivering a baby.  Relieve pain and itching from hemorrhoids or anal fissures.  Relieve pain after certain surgeries.  Relax muscles that are sore or tight. How to take a sitz bath Take 3-4 sitz baths a day, or as many as told by your health care provider. Bathtub sitz bath To take a sitz bath in a bathtub: 1. Partially fill a bathtub with warm water. The water should be deep enough to cover your hips and buttocks when you are sitting in the tub. 2. If your health care provider told you to put medicine in the water, follow his or her instructions. 3. Sit in the water. 4. Open the tub drain a little, and leave it open during your bath. 5. Turn on the warm water again, enough to replace the water that is draining out. Keep the water running throughout your bath. This helps keep the water at the right level and the right temperature. 6. Soak in the water for 15-20 minutes, or as long  as told by your health care provider. 7. When you are done, be careful when you stand up. You may feel dizzy. 8. After the sitz bath, pat yourself dry. Do not rub your skin to dry it.  Over-the-toilet sitz bath To take a sitz bath with an over-the-toilet basin: 1. Follow the manufacturer's instructions. 2. Fill the basin with warm water. 3. If your health care provider told you to put medicine in the water, follow his or her instructions. 4. Sit on the seat. Make sure the water covers your buttocks and perineum. 5. Soak in the water for 15-20 minutes, or as long as told by your health care provider. 6. After the sitz bath, pat yourself dry. Do not rub your skin to dry it. 7. Clean and dry the basin between uses. 8. Discard the basin if it cracks, or according to the manufacturer's instructions. Contact a health care provider if:  Your symptoms get worse. Do not continue with sitz baths if your symptoms get worse.  You have new symptoms. If this happens, do not continue with sitz baths until you talk with your health care provider. Summary  A sitz bath is a warm water bath in which the water only comes up to your hips and covers your buttocks.  A sitz bath may help relieve itching, relieve pain, and relax muscles that are sore or tight in  the lower part of your body, including your genital area.  Take 3-4 sitz baths a day, or as many as told by your health care provider. Soak in the water for 15-20 minutes.  Do not continue with sitz baths if your symptoms get worse. This information is not intended to replace advice given to you by your health care provider. Make sure you discuss any questions you have with your health care provider. Document Released: 06/27/2004 Document Revised: 10/07/2017 Document Reviewed: 10/07/2017 Elsevier Patient Education  2020 [employer].

## 2019-08-04 MED ORDER — HYDROCORTISONE ACETATE 25 MG RE SUPP: 25.0000 mg | Freq: Two times a day (BID) | RECTAL | 1 refills | Status: DC

## 2019-08-07 ENCOUNTER — Other Ambulatory Visit: Payer: Self-pay | Admitting: *Deleted

## 2019-08-07 MED ORDER — HYDROCORTISONE (PERIANAL) 2.5 % EX CREA: 1.0000 "application " | TOPICAL_CREAM | Freq: Two times a day (BID) | CUTANEOUS | 1 refills | Status: DC

## 2019-08-09 ENCOUNTER — Other Ambulatory Visit: Payer: Self-pay | Admitting: Internal Medicine

## 2019-08-09 DIAGNOSIS — I10 Essential (primary) hypertension: Secondary | ICD-10-CM

## 2019-09-05 DIAGNOSIS — K011 Impacted teeth: Secondary | ICD-10-CM | POA: Diagnosis not present

## 2019-10-02 ENCOUNTER — Encounter: Payer: Self-pay | Admitting: Internal Medicine

## 2019-10-02 ENCOUNTER — Ambulatory Visit (INDEPENDENT_AMBULATORY_CARE_PROVIDER_SITE_OTHER): Payer: Medicare Other | Admitting: Internal Medicine

## 2019-10-02 ENCOUNTER — Other Ambulatory Visit: Payer: Self-pay

## 2019-10-02 DIAGNOSIS — J4530 Mild persistent asthma, uncomplicated: Secondary | ICD-10-CM

## 2019-10-02 DIAGNOSIS — D721 Eosinophilia, unspecified: Secondary | ICD-10-CM | POA: Diagnosis not present

## 2019-10-02 DIAGNOSIS — R030 Elevated blood-pressure reading, without diagnosis of hypertension: Secondary | ICD-10-CM | POA: Diagnosis not present

## 2019-10-02 DIAGNOSIS — Z87891 Personal history of nicotine dependence: Secondary | ICD-10-CM | POA: Diagnosis not present

## 2019-10-02 DIAGNOSIS — Z86718 Personal history of other venous thrombosis and embolism: Secondary | ICD-10-CM

## 2019-10-02 DIAGNOSIS — E559 Vitamin D deficiency, unspecified: Secondary | ICD-10-CM

## 2019-10-02 MED ORDER — FLUTICASONE PROPIONATE 50 MCG/ACT NA SUSP: 2.0000 | Freq: Every day | NASAL | 3 refills | Status: DC

## 2019-10-02 MED ORDER — FLUTICASONE-SALMETEROL 100-50 MCG/DOSE IN AEPB: 1.0000 | INHALATION_SPRAY | Freq: Two times a day (BID) | RESPIRATORY_TRACT | 3 refills | Status: DC | PRN

## 2019-10-02 MED ORDER — ALBUTEROL SULFATE HFA 108 (90 BASE) MCG/ACT IN AERS: INHALATION_SPRAY | RESPIRATORY_TRACT | 4 refills | Status: DC

## 2019-10-02 MED ORDER — LORAZEPAM 0.5 MG PO TABS: 0.5000 mg | ORAL_TABLET | Freq: Three times a day (TID) | ORAL | 3 refills | Status: DC | PRN

## 2019-10-02 MED ORDER — DOXYCYCLINE HYCLATE 100 MG PO TABS: 100.0000 mg | ORAL_TABLET | Freq: Two times a day (BID) | ORAL | 5 refills | Status: DC

## 2019-10-02 NOTE — Assessment & Plan Note (Signed)
Discussed VitD advice during Covid pandemic. She continues supplement.

## 2019-10-02 NOTE — Assessment & Plan Note (Signed)
Well controlled. Infrequent need for rescue inhaler. 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 If asthma were worse she might be candidate for Biologic. Uncertain relationship to her remote hx of ? ABPA.  Plan- continue current meds. CXR prn. Watch Eos counts over time.

## 2019-10-02 NOTE — Patient Instructions (Signed)
Glad you are doing well. We can continue current meds- refills were sent as requested.  Please call if we can help

## 2019-10-02 NOTE — Progress Notes (Signed)
Subjective:    Patient ID: Debbie Hogan, female    DOB: 06-07-1951, 68 y.o.   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 Years ago, presented with lung nodules of retained mucus- Suspected but never proved ABPA. January 28, 2010 after a pneumonia 2 months prior. EOS were 11.6%, down from 28 %.  EOS 05/01/16- 1.4K (H) FENO 03/18/17- 72 (H) Office Spirometry 03/18/17-WNL-FVC 3.21/96%, FEV1 2.34/91%, ratio 0.73, FEF 25-75% 1.81/82%.  -----------------------------------------------------------------------------   09/29/2018- 68 year old female former smoker followed for asthma, history DVT, HBP, history ABPA with abnormal CXR, nasal polyps. Originally presented with lung nodules of retained mucus-never proved ABPA, resected nasal polyps -----asthma follow up, feels she is managing asthma well Advair 100, pro-air HFA He feels she is managing her asthma very well with no exacerbations.  Despite dusty home undergoing renovation, she is comfortable with less regular use of Advair.  She does use it for intervals which she thinks risk is increased-travel etc.  Has not used rescue inhaler more than a couple of times in the past year. Not quite ready for medication refills and there was an IT problem with prescriptions this morning so we are updating "No Print" status and she can call when needed.  She keeps doxycycline available, uses lorazepam rarely if needed and asks I continue to be the one to refill it since I have been over the years.  10/02/2019-  Virtual Visit via Telephone Note  I connected with Sharee Pimple on 10/02/19 at  9:00 AM EST by telephone and verified that I am speaking with the correct person using two identifiers.  Location: Patient: H Provider: O   I discussed the limitations, risks, security and privacy concerns of performing an evaluation and management service by telephone and the availability of in person appointments.  I also discussed with the patient that there may be a patient responsible charge related to this service. The patient expressed understanding and agreed to proceed.   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 Originally presented with lung nodules of retained mucus-never proved ABPA, resected nasal polyps Albuterol hfa, flonase, Advair 100 Last used rescue 2 months ago. Had flu vax. Feels well controlled. Very Covid careful and discussed vaccine. Lab 06/29/2019- Eos 10.2% H relative,  absolute 0.7 wnl              Discussed Had flu vax Observations/Objective:   Assessment and Plan: Asthma mild persistent- Well controlled. Refill meds. Eosinophilia- mild and often just elevated relative to WBC. Not a concern, but watch.   Follow Up Instructions: 1 year   I discussed the assessment and treatment plan with the patient. The patient was provided an opportunity to ask questions and all were answered. The patient agreed with the plan and demonstrated an understanding of the instructions.   The patient was advised to call back or seek an in-person evaluation if the symptoms worsen or if the condition fails to improve as anticipated.  I provided 21 minutes of non-face-to-face time during this encounter.   Baird Lyons, MD      ROS-see HPI    += positive Constitutional:   No-   weight loss, night sweats, fevers, chills, fatigue, lassitude. HEENT:   + headaches, difficulty swallowing, tooth/dental problems, sore throat,       No-  sneezing, itching, ear ache, nasal congestion, post nasal  drip,  CV:  No-   chest pain, orthopnea, PND, swelling in lower extremities, anasarca, dizziness,                     palpitations Resp: No-   shortness of breath with exertion or at rest.                productive cough,  no- non-productive cough,  No- coughing up of blood.         No- change in  color of mucus.  No- wheezing.   Skin: No-   rash or lesions. GI:  No-   heartburn, indigestion, abdominal pain, nausea, vomiting,  GU:  MS:  No-   joint pain or swelling.   Neuro-     nothing unusual Psych:  No- change in mood or affect. No depression or anxiety.  No memory loss.  OBJ- Physical Exam General- Alert, Oriented, Affect-appropriate, Distress- none acute, WDWN Skin- rash-none, lesions- none, excoriation- none Lymphadenopathy- none Head- atraumatic            Eyes- Gross vision intact, PERRLA, conjunctivae and secretions clear            Ears- + bilateral cerumen            Nose-, no-Septal dev, mucus, erosion, perforation             Throat- Mallampati II , mucosa clear , drainage- none, tonsils- atrophic Neck- flexible , trachea midline, no stridor , thyroid nl, carotid no bruit Chest - symmetrical excursion , unlabored           Heart/CV- RRR , no murmur , no gallop  , no rub, nl s1 s2                           - JVD- none , edema- none, stasis changes- none, varices- none           Lung-clear/unlabored, wheeze- none, cough- none , dullness-none, rub- none           Chest wall-  Abd-  Br/ Gen/ Rectal- Not done, not indicated Extrem- cyanosis- none, clubbing, none, atrophy- none, strength- nl Neuro- grossly intact to observation

## 2019-11-07 ENCOUNTER — Ambulatory Visit: Payer: Medicare Other | Attending: Internal Medicine

## 2019-11-07 DIAGNOSIS — Z23 Encounter for immunization: Secondary | ICD-10-CM | POA: Diagnosis not present

## 2019-11-07 NOTE — Progress Notes (Signed)
   Covid-19 Vaccination Clinic  Name:  Quayla Lubitz    MRN: [id number] DOB: [birth year]  11/07/2019  Ms. Gauvin was observed post Covid-19 immunization for 15 minutes without incidence. She was provided with Vaccine Information Sheet and instruction to access the V-Safe system.   Ms. Meidinger was instructed to call 911 with any severe reactions post vaccine: Marland Kitchen Difficulty breathing  . Swelling of your face and throat  . A fast heartbeat  . A bad rash all over your body  . Dizziness and weakness    Immunizations Administered    Name Date Dose VIS Date Route   Pfizer COVID-19 Vaccine 11/07/2019 12:23 PM 0.3 mL 09/29/2019 Intramuscular   Manufacturer: [place]   Lot: [id number]   [place]: [id number]

## 2019-11-20 ENCOUNTER — Ambulatory Visit: Payer: Medicare Other

## 2019-11-27 ENCOUNTER — Ambulatory Visit: Payer: Medicare Other | Attending: Internal Medicine

## 2019-11-27 DIAGNOSIS — Z23 Encounter for immunization: Secondary | ICD-10-CM | POA: Insufficient documentation

## 2019-11-27 NOTE — Progress Notes (Signed)
   Covid-19 Vaccination Clinic  Name:  Debbie Hogan    MRN: [id number] DOB: January 15, 1951  11/27/2019  Debbie Hogan was observed post Covid-19 immunization for 15 minutes without incidence. She was provided with Vaccine Information Sheet and instruction to access the V-Safe system.   Debbie Hogan was instructed to call 911 with any severe reactions post vaccine: Marland Kitchen Difficulty breathing  . Swelling of your face and throat  . A fast heartbeat  . A bad rash all over your body  . Dizziness and weakness    Immunizations Administered    Name Date Dose VIS Date Route   Pfizer COVID-19 Vaccine 11/27/2019  2:59 PM 0.3 mL 09/29/2019 Intramuscular   Manufacturer: [place]   Lot: [id number]   [place]: [id number]

## 2019-11-28 ENCOUNTER — Ambulatory Visit: Payer: Medicare Other

## 2019-12-06 ENCOUNTER — Telehealth: Payer: Self-pay | Admitting: Internal Medicine

## 2019-12-06 NOTE — Progress Notes (Signed)
°  Chronic Care Management   Outreach Note  12/06/2019 Name: Debbie Hogan MRN: [id number] DOB: 17-Dec-1950  Referred by: Janith Lima, MD Reason for referral : No chief complaint on file.   An unsuccessful telephone outreach was attempted today. The patient was referred to the pharmacist for assistance with care management and care coordination.   Follow Up Plan:   [name] [employer] Scheduler

## 2020-01-01 ENCOUNTER — Encounter: Payer: Self-pay | Admitting: Internal Medicine

## 2020-01-04 ENCOUNTER — Encounter: Payer: Self-pay | Admitting: Internal Medicine

## 2020-01-04 ENCOUNTER — Ambulatory Visit (INDEPENDENT_AMBULATORY_CARE_PROVIDER_SITE_OTHER): Payer: Medicare Other | Admitting: Internal Medicine

## 2020-01-04 ENCOUNTER — Other Ambulatory Visit: Payer: Self-pay

## 2020-01-04 VITALS — BP 132/72 | HR 69 | Temp 97.8°F | Resp 16 | Ht 65.0 in | Wt 138.0 lb

## 2020-01-04 DIAGNOSIS — M5416 Radiculopathy, lumbar region: Secondary | ICD-10-CM | POA: Diagnosis not present

## 2020-01-04 MED ORDER — RELAFEN DS 1000 MG PO TABS: 2.0000 | ORAL_TABLET | Freq: Every day | ORAL | 0 refills | Status: DC

## 2020-01-04 NOTE — Patient Instructions (Signed)

## 2020-01-04 NOTE — Progress Notes (Addendum)
Subjective:  Patient ID: Debbie Hogan, female    DOB: 1950/10/29  Age: 69 y.o. MRN: [id number]  CC: Back Pain  This visit occurred during the SARS-CoV-2 public health emergency.  Safety protocols were in place, including screening questions prior to the visit, additional usage of staff PPE, and extensive cleaning of exam room while observing appropriate contact time as indicated for disinfecting solutions.    HPI Debbie Hogan presents for concerns about a 1 month history of nontraumatic right lower back pain.  She says the pain radiates into her right lower extremity and increases with activity but she denies paresthesias.  She is not getting much symptom relief with over-the-counter doses of Advil.  Outpatient Medications Prior to Visit  Medication Sig Dispense Refill  . albuterol (PROAIR HFA) 108 (90 Base) MCG/ACT inhaler USE 2 INHALATIONS INTO THE LUNGS  EVERY 6 HOURS AS NEEDED FOR WHEEZING OR SHORTNESS OF BREATH 54 g 4  . buPROPion (WELLBUTRIN XL) 150 MG 24 hr tablet TAKE 1 TABLET DAILY 90 tablet 3  . calcium citrate-vitamin D (CITRACAL+D) 315-200 MG-UNIT per tablet Take 1 tablet by mouth every other day.     Marland Kitchen doxycycline (VIBRA-TABS) 100 MG tablet Take 1 tablet (100 mg total) by mouth 2 (two) times daily. 14 tablet 5  . estradiol (VIVELLE-DOT) 0.0375 MG/24HR Place 1 patch onto the skin 2 (two) times a week. Place on Monday and Thursday    . fluticasone (FLONASE) 50 MCG/ACT nasal spray Place 2 sprays into both nostrils daily. 48 g 3  . Fluticasone-Salmeterol (ADVAIR DISKUS) 100-50 MCG/DOSE AEPB Inhale 1 puff into the lungs 2 (two) times daily as needed. 3 each 3  . guaiFENesin (MUCINEX) 600 MG 12 hr tablet Take 600 mg by mouth daily.    Marland Kitchen LORazepam (ATIVAN) 0.5 MG tablet Take 1 tablet (0.5 mg total) by mouth every 8 (eight) hours as needed for anxiety. 90 tablet 3  . losartan (COZAAR) 100 MG tablet Take 0.5 tablets (50 mg total) by mouth daily. 90 tablet 1  . Pitavastatin Calcium  (LIVALO) 2 MG TABS Livalo 2 mg tablet    . progesterone (PROMETRIUM) 200 MG capsule progesterone micronized 200 mg capsule  TAKE 1 CAPSULE ON DAYS 1 TO 12 OF EVERY MONTH     No facility-administered medications prior to visit.    ROS Review of Systems  Constitutional: Negative for chills, fatigue and fever.  HENT: Negative.   Eyes: Negative for visual disturbance.  Respiratory: Negative for cough, chest tightness, shortness of breath and wheezing.   Cardiovascular: Negative for chest pain, palpitations and leg swelling.  Gastrointestinal: Negative for abdominal pain, constipation, diarrhea, nausea and vomiting.  Endocrine: Negative.   Genitourinary: Negative.  Negative for difficulty urinating.  Musculoskeletal: Positive for back pain. Negative for arthralgias, myalgias and neck pain.  Skin: Negative.  Negative for color change and pallor.  Neurological: Negative.  Negative for dizziness, weakness, light-headedness, numbness and headaches.  Hematological: Negative for adenopathy. Does not bruise/bleed easily.  Psychiatric/Behavioral: Negative.     Objective:  BP 132/72 (BP Location: Left Arm, Patient Position: Sitting, Cuff Size: Normal)   Pulse 69   Temp 97.8 F (36.6 C) (Oral)   Resp 16   Ht 5\' 5"  (1.651 m)   Wt 138 lb (62.6 kg)   SpO2 99%   BMI 22.96 kg/m   BP Readings from Last 3 Encounters:  01/04/20 132/72  08/03/19 (!) 142/94  06/29/19 130/80    Wt Readings from Last 3 Encounters:  01/04/20 138 lb (62.6 kg)  08/03/19 134 lb (60.8 kg)  06/29/19 133 lb (60.3 kg)    Physical Exam Vitals reviewed.  Constitutional:      Appearance: Normal appearance.  HENT:     Mouth/Throat:     Mouth: Mucous membranes are moist.  Eyes:     General: No scleral icterus.    Conjunctiva/sclera: Conjunctivae normal.  Cardiovascular:     Rate and Rhythm: Normal rate and regular rhythm.     Heart sounds: No murmur.  Pulmonary:     Effort: Pulmonary effort is normal.      Breath sounds: No stridor. No wheezing, rhonchi or rales.  Abdominal:     General: Abdomen is flat. Bowel sounds are normal. There is no distension.     Palpations: Abdomen is soft. There is no hepatomegaly, splenomegaly or mass.     Tenderness: There is no abdominal tenderness.  Musculoskeletal:        General: Normal range of motion.     Cervical back: Neck supple.     Thoracic back: Normal.     Lumbar back: Normal. No edema, deformity, spasms, tenderness or bony tenderness. Normal range of motion. Negative right straight leg raise test and negative left straight leg raise test.     Right lower leg: No edema.     Left lower leg: No edema.  Lymphadenopathy:     Cervical: No cervical adenopathy.  Skin:    General: Skin is warm and dry.  Neurological:     General: No focal deficit present.     Mental Status: She is alert.     Cranial Nerves: Cranial nerves are intact.     Sensory: Sensation is intact.     Motor: Motor function is intact. No weakness.     Coordination: Coordination is intact.     Deep Tendon Reflexes: Reflexes normal.     Reflex Scores:      Tricep reflexes are 1+ on the right side and 1+ on the left side.      Bicep reflexes are 1+ on the right side and 1+ on the left side.      Brachioradialis reflexes are 1+ on the right side and 1+ on the left side.      Patellar reflexes are 1+ on the right side and 1+ on the left side.      Achilles reflexes are 1+ on the right side and 1+ on the left side. Psychiatric:        Mood and Affect: Mood normal.        Behavior: Behavior normal.     Lab Results  Component Value Date   WBC 6.8 06/29/2019   HGB 15.0 06/29/2019   HCT 44.2 06/29/2019   PLT 391.0 06/29/2019   GLUCOSE 92 06/29/2019   CHOL 273 (H) 06/29/2019   TRIG 93.0 06/29/2019   HDL 72.80 06/29/2019   LDLDIRECT 133.7 01/08/2010   LDLCALC 181 (H) 06/29/2019   ALT 11 06/29/2019   AST 17 06/29/2019   NA 139 06/29/2019   K 4.2 06/29/2019   CL 102 06/29/2019    CREATININE 0.65 06/29/2019   BUN 13 06/29/2019   CO2 30 06/29/2019   TSH 1.36 06/29/2019   HGBA1C 5.5 02/07/2013    DG Lumbar Spine Complete  Result Date: 01/05/2020 CLINICAL DATA:  Right-sided low back pain radiating down the right leg for 1 month. No injury. EXAM: LUMBAR SPINE - COMPLETE 4+ VIEW COMPARISON:  None. FINDINGS: No fracture.  No bone lesion. Grade 1 anterolisthesis of L5 on S1. No other spondylolisthesis. Mild loss of disc height L5-S1. Remaining lumbar discs well preserved in height facet degenerative changes noted bilaterally at L5-S1. Soft tissues are unremarkable. IMPRESSION: 1. No fracture or acute finding. 2. Disc and facet degenerative changes at L5-S1 with grade 1 anterolisthesis. Electronically Signed   By: Lajean Manes M.D.   On: 01/05/2020 09:08    Assessment & Plan:   Chastine was seen today for back pain.  Diagnoses and all orders for this visit:  Right lumbar radiculitis- She has a 1 month history of low back pain that radiates into her right lower extremity but she is neurologically intact.  Her plain films show disc and facet degenerative changes at L5-S1 with a grade 1 anterolisthesis.  I gave her some information about physical modalities to treat the back pain and I recommended that she take nabumetone for the pain.  If the pain does not resolve soon then will consider doing an MRI of the lumbar spine to see if there is spinal stenosis, nerve impingement, or disc herniation. -     DG Lumbar Spine Complete; Future -     Discontinue: Nabumetone (RELAFEN DS) 1000 MG TABS; Take 2 tablets by mouth daily. -     Nabumetone (RELAFEN DS) 1000 MG TABS; Take 2 tablets by mouth daily.   I have discontinued Debbie Hogan "Jenny"'s Relafen DS. I am also having her start on Relafen DS. Additionally, I am having her maintain her calcium citrate-vitamin D, estradiol, buPROPion, guaiFENesin, losartan, Livalo, progesterone, Fluticasone-Salmeterol, fluticasone, albuterol,  LORazepam, and doxycycline.  Meds ordered this encounter  Medications  . DISCONTD: Nabumetone (RELAFEN DS) 1000 MG TABS    Sig: Take 2 tablets by mouth daily.    Dispense:  16 tablet    Refill:  0  . Nabumetone (RELAFEN DS) 1000 MG TABS    Sig: Take 2 tablets by mouth daily.    Dispense:  16 tablet    Refill:  0     Follow-up: Return in about 3 weeks (around 01/25/2020).  Scarlette Calico, MD

## 2020-01-05 ENCOUNTER — Encounter: Payer: Self-pay | Admitting: Internal Medicine

## 2020-01-05 ENCOUNTER — Ambulatory Visit (INDEPENDENT_AMBULATORY_CARE_PROVIDER_SITE_OTHER): Payer: Medicare Other

## 2020-01-05 DIAGNOSIS — M545 Low back pain: Secondary | ICD-10-CM | POA: Diagnosis not present

## 2020-01-05 DIAGNOSIS — M5416 Radiculopathy, lumbar region: Secondary | ICD-10-CM

## 2020-01-10 ENCOUNTER — Other Ambulatory Visit: Payer: Self-pay | Admitting: Internal Medicine

## 2020-01-10 DIAGNOSIS — M5416 Radiculopathy, lumbar region: Secondary | ICD-10-CM

## 2020-01-10 MED ORDER — RELAFEN DS 1000 MG PO TABS: 2.0000 | ORAL_TABLET | Freq: Every day | ORAL | 1 refills | Status: DC

## 2020-01-11 NOTE — Telephone Encounter (Signed)
   Patient requesting additional samples of Nabumetone (RELAFEN DS) 1000 MG TABS while waiting on approval to be filled . Requesting call from [facility]

## 2020-01-19 ENCOUNTER — Other Ambulatory Visit: Payer: Self-pay | Admitting: Internal Medicine

## 2020-01-19 DIAGNOSIS — E785 Hyperlipidemia, unspecified: Secondary | ICD-10-CM

## 2020-01-22 ENCOUNTER — Encounter: Payer: Self-pay | Admitting: Internal Medicine

## 2020-01-23 ENCOUNTER — Other Ambulatory Visit: Payer: Self-pay | Admitting: Internal Medicine

## 2020-01-23 DIAGNOSIS — M51369 Other intervertebral disc degeneration, lumbar region without mention of lumbar back pain or lower extremity pain: Secondary | ICD-10-CM | POA: Insufficient documentation

## 2020-01-23 DIAGNOSIS — M5416 Radiculopathy, lumbar region: Secondary | ICD-10-CM

## 2020-01-23 DIAGNOSIS — M5136 Other intervertebral disc degeneration, lumbar region: Secondary | ICD-10-CM

## 2020-01-23 MED ORDER — METHYLPREDNISOLONE 4 MG PO TBPK: ORAL_TABLET | ORAL | 0 refills | Status: DC

## 2020-01-23 MED ORDER — NABUMETONE 750 MG PO TABS: 750.0000 mg | ORAL_TABLET | Freq: Two times a day (BID) | ORAL | 1 refills | Status: DC | PRN

## 2020-01-30 ENCOUNTER — Encounter: Payer: Self-pay | Admitting: Internal Medicine

## 2020-01-31 ENCOUNTER — Other Ambulatory Visit: Payer: Self-pay | Admitting: Internal Medicine

## 2020-01-31 DIAGNOSIS — M5136 Other intervertebral disc degeneration, lumbar region: Secondary | ICD-10-CM

## 2020-01-31 DIAGNOSIS — M5416 Radiculopathy, lumbar region: Secondary | ICD-10-CM

## 2020-02-21 ENCOUNTER — Encounter: Payer: Self-pay | Admitting: Internal Medicine

## 2020-02-22 ENCOUNTER — Ambulatory Visit
Admission: RE | Admit: 2020-02-22 | Discharge: 2020-02-22 | Disposition: A | Discharge status: Home / Self Care | Payer: Medicare Other | Source: Ambulatory Visit | Attending: Internal Medicine | Admitting: Internal Medicine

## 2020-02-22 ENCOUNTER — Other Ambulatory Visit: Payer: Self-pay | Admitting: Internal Medicine

## 2020-02-22 ENCOUNTER — Other Ambulatory Visit: Payer: Self-pay

## 2020-02-22 ENCOUNTER — Encounter: Payer: Self-pay | Admitting: Internal Medicine

## 2020-02-22 DIAGNOSIS — M5136 Other intervertebral disc degeneration, lumbar region: Secondary | ICD-10-CM

## 2020-02-22 DIAGNOSIS — M48061 Spinal stenosis, lumbar region without neurogenic claudication: Secondary | ICD-10-CM | POA: Diagnosis not present

## 2020-02-22 DIAGNOSIS — M5416 Radiculopathy, lumbar region: Secondary | ICD-10-CM

## 2020-02-24 ENCOUNTER — Other Ambulatory Visit: Payer: Medicare Other

## 2020-02-26 DIAGNOSIS — H4311 Vitreous hemorrhage, right eye: Secondary | ICD-10-CM | POA: Diagnosis not present

## 2020-03-01 NOTE — Telephone Encounter (Signed)
Referral has been entered as requested.  

## 2020-03-12 DIAGNOSIS — M4317 Spondylolisthesis, lumbosacral region: Secondary | ICD-10-CM | POA: Diagnosis not present

## 2020-03-12 DIAGNOSIS — M47817 Spondylosis without myelopathy or radiculopathy, lumbosacral region: Secondary | ICD-10-CM | POA: Diagnosis not present

## 2020-03-13 DIAGNOSIS — M545 Low back pain: Secondary | ICD-10-CM | POA: Diagnosis not present

## 2020-03-21 DIAGNOSIS — M545 Low back pain: Secondary | ICD-10-CM | POA: Diagnosis not present

## 2020-03-23 ENCOUNTER — Other Ambulatory Visit: Payer: Self-pay | Admitting: Internal Medicine

## 2020-03-23 DIAGNOSIS — F320 Major depressive disorder, single episode, mild: Secondary | ICD-10-CM

## 2020-03-25 DIAGNOSIS — M545 Low back pain: Secondary | ICD-10-CM | POA: Diagnosis not present

## 2020-03-27 ENCOUNTER — Encounter: Payer: Self-pay | Admitting: Internal Medicine

## 2020-03-27 ENCOUNTER — Ambulatory Visit (INDEPENDENT_AMBULATORY_CARE_PROVIDER_SITE_OTHER): Payer: Medicare Other | Admitting: Internal Medicine

## 2020-03-27 ENCOUNTER — Other Ambulatory Visit: Payer: Self-pay

## 2020-03-27 VITALS — BP 160/90 | HR 65 | Temp 99.0°F | Resp 16 | Ht 65.0 in | Wt 134.2 lb

## 2020-03-27 DIAGNOSIS — M5416 Radiculopathy, lumbar region: Secondary | ICD-10-CM

## 2020-03-27 DIAGNOSIS — I10 Essential (primary) hypertension: Secondary | ICD-10-CM | POA: Diagnosis not present

## 2020-03-27 DIAGNOSIS — M545 Low back pain: Secondary | ICD-10-CM | POA: Diagnosis not present

## 2020-03-27 MED ORDER — EDARBI 40 MG PO TABS: 1.0000 | ORAL_TABLET | Freq: Every day | ORAL | 0 refills | Status: DC

## 2020-03-27 MED ORDER — GRALISE 300 MG PO TABS: 1.0000 | ORAL_TABLET | Freq: Every evening | ORAL | 0 refills | Status: DC

## 2020-03-27 NOTE — Patient Instructions (Signed)

## 2020-03-27 NOTE — Progress Notes (Signed)
Subjective:  Patient ID: Debbie Hogan, female    DOB: 07-30-1951  Age: 69 y.o. MRN: [id number]  CC: Hypertension and Back Pain  This visit occurred during the SARS-CoV-2 public health emergency.  Safety protocols were in place, including screening questions prior to the visit, additional usage of staff PPE, and extensive cleaning of exam room while observing appropriate contact time as indicated for disinfecting solutions.    HPI Debbie Hogan presents for ongoing concerns about low back pain.  She continues to complain of a low back pain that radiates into her right leg with a burning sensation in her right lower extremity.  She recently saw neurosurgery and was told that she does not have anything that would be amenable to surgery.  She has an upcoming appointment with pain management.  She tells me that she gets through the day pretty well but then during the night she wakes up with pain and spasms in her lower back that causes her to curl up into a turtle position to try to get discomfort.  She has tried several medications to relieve this and has not gotten much symptom relief with anti-inflammatories, methylprednisolone, and her neurosurgeon recently prescribed hydrocodone and acetaminophen.  Outpatient Medications Prior to Visit  Medication Sig Dispense Refill  . albuterol (PROAIR HFA) 108 (90 Base) MCG/ACT inhaler USE 2 INHALATIONS INTO THE LUNGS  EVERY 6 HOURS AS NEEDED FOR WHEEZING OR SHORTNESS OF BREATH 54 g 4  . buPROPion (WELLBUTRIN XL) 150 MG 24 hr tablet TAKE 1 TABLET DAILY 90 tablet 1  . calcium citrate-vitamin D (CITRACAL+D) 315-200 MG-UNIT per tablet Take 1 tablet by mouth every other day.     . estradiol (VIVELLE-DOT) 0.0375 MG/24HR Place 1 patch onto the skin 2 (two) times a week. Place on Monday and Thursday    . fluticasone (FLONASE) 50 MCG/ACT nasal spray Place 2 sprays into both nostrils daily. 48 g 3  . Fluticasone-Salmeterol (ADVAIR DISKUS) 100-50 MCG/DOSE AEPB Inhale  1 puff into the lungs 2 (two) times daily as needed. 3 each 3  . guaiFENesin (MUCINEX) 600 MG 12 hr tablet Take 600 mg by mouth daily.    Marland Kitchen HYDROcodone-acetaminophen (NORCO/VICODIN) 5-325 MG tablet Take 1 tablet by mouth every 4 (four) hours as needed.    Marland Kitchen LIVALO 2 MG TABS TAKE 1 TABLET DAILY 90 tablet 1  . LORazepam (ATIVAN) 0.5 MG tablet Take 1 tablet (0.5 mg total) by mouth every 8 (eight) hours as needed for anxiety. 90 tablet 3  . progesterone (PROMETRIUM) 200 MG capsule progesterone micronized 200 mg capsule  TAKE 1 CAPSULE ON DAYS 1 TO 12 OF EVERY MONTH    . losartan (COZAAR) 100 MG tablet Take 0.5 tablets (50 mg total) by mouth daily. 90 tablet 1  . nabumetone (RELAFEN) 750 MG tablet Take 1 tablet (750 mg total) by mouth 2 (two) times daily as needed. 180 tablet 1  . doxycycline (VIBRA-TABS) 100 MG tablet Take 1 tablet (100 mg total) by mouth 2 (two) times daily. 14 tablet 5  . methylPREDNISolone (MEDROL DOSEPAK) 4 MG TBPK tablet TAKE AS DIRECTED 21 tablet 0   No facility-administered medications prior to visit.    ROS Review of Systems  Constitutional: Negative for appetite change, diaphoresis and fatigue.  HENT: Negative.   Eyes: Negative for visual disturbance.  Respiratory: Negative for cough, chest tightness, shortness of breath and wheezing.   Cardiovascular: Negative for chest pain, palpitations and leg swelling.  Gastrointestinal: Negative for abdominal pain, constipation, diarrhea,  nausea and vomiting.  Endocrine: Negative.   Genitourinary: Negative.  Negative for difficulty urinating, dysuria and hematuria.  Musculoskeletal: Positive for back pain. Negative for myalgias and neck pain.  Skin: Negative.  Negative for color change, pallor and rash.  Neurological: Negative.  Negative for weakness, numbness and headaches.  Hematological: Negative for adenopathy. Does not bruise/bleed easily.  Psychiatric/Behavioral: Negative.     Objective:  BP (!) 160/90 (BP Location:  Left Arm, Patient Position: Sitting, Cuff Size: Normal)   Pulse 65   Temp 99 F (37.2 C) (Oral)   Resp 16   Ht 5\' 5"  (1.651 m)   Wt 134 lb 4 oz (60.9 kg)   SpO2 98%   BMI 22.34 kg/m   BP Readings from Last 3 Encounters:  03/27/20 (!) 160/90  01/04/20 132/72  08/03/19 (!) 142/94    Wt Readings from Last 3 Encounters:  03/27/20 134 lb 4 oz (60.9 kg)  01/04/20 138 lb (62.6 kg)  08/03/19 134 lb (60.8 kg)    Physical Exam Vitals reviewed.  Constitutional:      Appearance: Normal appearance.  HENT:     Nose: Nose normal.     Mouth/Throat:     Mouth: Mucous membranes are moist.  Eyes:     General: No scleral icterus.    Conjunctiva/sclera: Conjunctivae normal.  Cardiovascular:     Rate and Rhythm: Normal rate and regular rhythm.     Pulses: Normal pulses.  Pulmonary:     Effort: Pulmonary effort is normal.     Breath sounds: No wheezing, rhonchi or rales.  Abdominal:     General: Abdomen is flat.     Palpations: There is no mass.     Tenderness: There is no abdominal tenderness.  Musculoskeletal:        General: Normal range of motion.     Cervical back: Normal and neck supple.     Thoracic back: Normal.     Lumbar back: Normal. No swelling, edema, deformity, spasms or bony tenderness. Normal range of motion. Negative right straight leg raise test and negative left straight leg raise test.     Right lower leg: No edema.     Left lower leg: No edema.  Lymphadenopathy:     Cervical: No cervical adenopathy.  Skin:    General: Skin is warm and dry.  Neurological:     General: No focal deficit present.     Mental Status: She is alert and oriented to person, place, and time. Mental status is at baseline.  Psychiatric:        Attention and Perception: Attention normal.        Mood and Affect: Affect is tearful.        Speech: Speech normal.        Behavior: Behavior normal.     Lab Results  Component Value Date   WBC 6.8 06/29/2019   HGB 15.0 06/29/2019   HCT  44.2 06/29/2019   PLT 391.0 06/29/2019   GLUCOSE 92 06/29/2019   CHOL 273 (H) 06/29/2019   TRIG 93.0 06/29/2019   HDL 72.80 06/29/2019   LDLDIRECT 133.7 01/08/2010   LDLCALC 181 (H) 06/29/2019   ALT 11 06/29/2019   AST 17 06/29/2019   NA 139 06/29/2019   K 4.2 06/29/2019   CL 102 06/29/2019   CREATININE 0.65 06/29/2019   BUN 13 06/29/2019   CO2 30 06/29/2019   TSH 1.36 06/29/2019   HGBA1C 5.5 02/07/2013    MR Lumbar Spine  Wo Contrast  Result Date: 02/22/2020 CLINICAL DATA:  Low back pain and right hip pain extending down the right leg. EXAM: MRI LUMBAR SPINE WITHOUT CONTRAST TECHNIQUE: Multiplanar, multisequence MR imaging of the lumbar spine was performed. No intravenous contrast was administered. COMPARISON:  Radiographs from 01/05/2020 FINDINGS: Segmentation: The lowest lumbar type non-rib-bearing vertebra is labeled as L5. Alignment:  5 mm of degenerative anterolisthesis at L5-S1 Vertebrae: Disc desiccation at L5-S1 with loss of disc height. Degenerative facet edema bilaterally at L5-S1 Conus medullaris and cauda equina: Conus extends to the L1 level. Conus and cauda equina appear normal. Paraspinal and other soft tissues: Unremarkable Disc levels: L1-2: Unremarkable. L2-3: Unremarkable. L3-4: No impingement. Mild disc bulge. L4-5: Mild left subarticular lateral recess stenosis and borderline bilateral foraminal stenosis as well as borderline central narrowing of the thecal sac due to disc bulge and facet arthropathy. L5-S1: Mild bilateral foraminal stenosis and mild central narrowing of the thecal sac due to disc uncovering, disc bulge, and intervertebral spurring. There is also left greater than right degenerative facet arthropathy with facet edema. IMPRESSION: 1. Lumbar spondylosis and degenerative disc disease, causing mild impingement at L4-5 and L5-S1. 2. Degenerative facet edema bilaterally at L5-S1 with facet edema. Electronically Signed   By: Van Clines M.D.   On: 02/22/2020  15:30    Assessment & Plan:   Debbie Hogan was seen today for hypertension and back pain.  Diagnoses and all orders for this visit:  Essential hypertension- Her blood pressure is not adequately well controlled.  I recommended that she upgrade to a more potent ARB. -     Azilsartan Medoxomil (EDARBI) 40 MG TABS; Take 1 tablet by mouth daily.  Right lumbar radiculitis- Will try to control her discomfort with gabapentin.  She will continue taking the other meds as needed. -     Gabapentin, Once-Daily, (GRALISE) 300 MG TABS; Take 1 tablet by mouth every evening.   I have discontinued Debbie Mulhall "Jenny"'s losartan, doxycycline, methylPREDNISolone, and nabumetone. I am also having her start on Gralise and Edarbi. Additionally, I am having her maintain her calcium citrate-vitamin D, estradiol, guaiFENesin, progesterone, Fluticasone-Salmeterol, fluticasone, albuterol, LORazepam, Livalo, buPROPion, and HYDROcodone-acetaminophen.  Meds ordered this encounter  Medications  . Gabapentin, Once-Daily, (GRALISE) 300 MG TABS    Sig: Take 1 tablet by mouth every evening.    Dispense:  9 tablet    Refill:  0  . Azilsartan Medoxomil (EDARBI) 40 MG TABS    Sig: Take 1 tablet by mouth daily.    Dispense:  70 tablet    Refill:  0     Follow-up: Return in about 2 months (around 05/27/2020).  Scarlette Calico, MD

## 2020-04-02 DIAGNOSIS — H5203 Hypermetropia, bilateral: Secondary | ICD-10-CM | POA: Diagnosis not present

## 2020-04-02 DIAGNOSIS — H43811 Vitreous degeneration, right eye: Secondary | ICD-10-CM | POA: Diagnosis not present

## 2020-04-02 DIAGNOSIS — H2513 Age-related nuclear cataract, bilateral: Secondary | ICD-10-CM | POA: Diagnosis not present

## 2020-04-04 ENCOUNTER — Encounter: Payer: Self-pay | Admitting: Internal Medicine

## 2020-04-30 ENCOUNTER — Ambulatory Visit (INDEPENDENT_AMBULATORY_CARE_PROVIDER_SITE_OTHER): Payer: Medicare Other | Admitting: Psychology

## 2020-04-30 DIAGNOSIS — F4323 Adjustment disorder with mixed anxiety and depressed mood: Secondary | ICD-10-CM | POA: Diagnosis not present

## 2020-05-07 ENCOUNTER — Ambulatory Visit: Payer: Medicare Other | Admitting: Psychology

## 2020-05-07 DIAGNOSIS — M545 Low back pain: Secondary | ICD-10-CM | POA: Diagnosis not present

## 2020-05-07 DIAGNOSIS — M4317 Spondylolisthesis, lumbosacral region: Secondary | ICD-10-CM | POA: Diagnosis not present

## 2020-05-08 DIAGNOSIS — D1801 Hemangioma of skin and subcutaneous tissue: Secondary | ICD-10-CM | POA: Diagnosis not present

## 2020-05-08 DIAGNOSIS — L814 Other melanin hyperpigmentation: Secondary | ICD-10-CM | POA: Diagnosis not present

## 2020-05-08 DIAGNOSIS — L308 Other specified dermatitis: Secondary | ICD-10-CM | POA: Diagnosis not present

## 2020-05-08 DIAGNOSIS — L821 Other seborrheic keratosis: Secondary | ICD-10-CM | POA: Diagnosis not present

## 2020-05-08 DIAGNOSIS — D485 Neoplasm of uncertain behavior of skin: Secondary | ICD-10-CM | POA: Diagnosis not present

## 2020-05-08 DIAGNOSIS — L738 Other specified follicular disorders: Secondary | ICD-10-CM | POA: Diagnosis not present

## 2020-05-08 DIAGNOSIS — L812 Freckles: Secondary | ICD-10-CM | POA: Diagnosis not present

## 2020-05-08 DIAGNOSIS — L570 Actinic keratosis: Secondary | ICD-10-CM | POA: Diagnosis not present

## 2020-05-13 DIAGNOSIS — M545 Low back pain: Secondary | ICD-10-CM | POA: Diagnosis not present

## 2020-05-14 ENCOUNTER — Ambulatory Visit: Payer: Medicare Other | Admitting: Psychology

## 2020-05-15 DIAGNOSIS — M545 Low back pain: Secondary | ICD-10-CM | POA: Diagnosis not present

## 2020-05-27 DIAGNOSIS — M545 Low back pain: Secondary | ICD-10-CM | POA: Diagnosis not present

## 2020-05-29 DIAGNOSIS — M545 Low back pain: Secondary | ICD-10-CM | POA: Diagnosis not present

## 2020-05-30 DIAGNOSIS — N950 Postmenopausal bleeding: Secondary | ICD-10-CM | POA: Diagnosis not present

## 2020-06-06 DIAGNOSIS — N950 Postmenopausal bleeding: Secondary | ICD-10-CM | POA: Diagnosis not present

## 2020-06-11 DIAGNOSIS — M5136 Other intervertebral disc degeneration, lumbar region: Secondary | ICD-10-CM | POA: Diagnosis not present

## 2020-06-11 DIAGNOSIS — M4317 Spondylolisthesis, lumbosacral region: Secondary | ICD-10-CM | POA: Diagnosis not present

## 2020-06-11 DIAGNOSIS — M5416 Radiculopathy, lumbar region: Secondary | ICD-10-CM | POA: Diagnosis not present

## 2020-06-12 ENCOUNTER — Other Ambulatory Visit: Payer: Self-pay | Admitting: Internal Medicine

## 2020-06-12 MED ORDER — LORAZEPAM 0.5 MG PO TABS: ORAL_TABLET | ORAL | 5 refills | Status: AC

## 2020-06-12 NOTE — Telephone Encounter (Signed)
Alprazolam refill as requested

## 2020-06-12 NOTE — Telephone Encounter (Signed)
Refill request for Lorazepam  Last OV: 09/2019 Next OV: 1 year  Last ordered by : Dr. Annamaria Boots Quantity: 83   Dr. Annamaria Boots please advise  Allergies  Allergen Reactions  . Atorvastatin Other (See Comments)    Muscle aches  . Kiwi Extract Other (See Comments)    Other Reaction: Other reaction  . Montelukast Sodium   . Other Other (See Comments)    Uncoded Allergy. Allergen: CANTALOPE, Other Reaction: GI Upset Uncoded Allergy. Allergen: bananas, Other Reaction: GI Upset Uncoded Allergy. Allergen: honey dew, Other Reaction: GI Upset  . Montelukast Sodium Palpitations    Irregular heart beat    Current Outpatient Medications on File Prior to Visit  Medication Sig Dispense Refill  . albuterol (PROAIR HFA) 108 (90 Base) MCG/ACT inhaler USE 2 INHALATIONS INTO THE LUNGS  EVERY 6 HOURS AS NEEDED FOR WHEEZING OR SHORTNESS OF BREATH 54 g 4  . Azilsartan Medoxomil (EDARBI) 40 MG TABS Take 1 tablet by mouth daily. 70 tablet 0  . buPROPion (WELLBUTRIN XL) 150 MG 24 hr tablet TAKE 1 TABLET DAILY 90 tablet 1  . calcium citrate-vitamin D (CITRACAL+D) 315-200 MG-UNIT per tablet Take 1 tablet by mouth every other day.     . estradiol (VIVELLE-DOT) 0.0375 MG/24HR Place 1 patch onto the skin 2 (two) times a week. Place on Monday and Thursday    . fluticasone (FLONASE) 50 MCG/ACT nasal spray Place 2 sprays into both nostrils daily. 48 g 3  . Fluticasone-Salmeterol (ADVAIR DISKUS) 100-50 MCG/DOSE AEPB Inhale 1 puff into the lungs 2 (two) times daily as needed. 3 each 3  . Gabapentin, Once-Daily, (GRALISE) 300 MG TABS Take 1 tablet by mouth every evening. 9 tablet 0  . guaiFENesin (MUCINEX) 600 MG 12 hr tablet Take 600 mg by mouth daily.    Marland Kitchen HYDROcodone-acetaminophen (NORCO/VICODIN) 5-325 MG tablet Take 1 tablet by mouth every 4 (four) hours as needed.    Marland Kitchen LIVALO 2 MG TABS TAKE 1 TABLET DAILY 90 tablet 1  . LORazepam (ATIVAN) 0.5 MG tablet TAKE 1 TABLET(0.5 MG) BY MOUTH EVERY 8 HOURS AS NEEDED FOR ANXIETY  90 tablet 0  . progesterone (PROMETRIUM) 200 MG capsule progesterone micronized 200 mg capsule  TAKE 1 CAPSULE ON DAYS 1 TO 12 OF EVERY MONTH    . [DISCONTINUED] buPROPion (WELLBUTRIN XL) 150 MG 24 hr tablet TAKE 1 TABLET DAILY 90 tablet 3   No current facility-administered medications on file prior to visit.

## 2020-07-02 DIAGNOSIS — M5416 Radiculopathy, lumbar region: Secondary | ICD-10-CM | POA: Diagnosis not present

## 2020-07-02 DIAGNOSIS — M5136 Other intervertebral disc degeneration, lumbar region: Secondary | ICD-10-CM | POA: Diagnosis not present

## 2020-07-03 ENCOUNTER — Ambulatory Visit (INDEPENDENT_AMBULATORY_CARE_PROVIDER_SITE_OTHER): Payer: Medicare Other

## 2020-07-03 ENCOUNTER — Ambulatory Visit (INDEPENDENT_AMBULATORY_CARE_PROVIDER_SITE_OTHER): Payer: Medicare Other | Admitting: Internal Medicine

## 2020-07-03 ENCOUNTER — Other Ambulatory Visit: Payer: Self-pay

## 2020-07-03 ENCOUNTER — Encounter: Payer: Self-pay | Admitting: Internal Medicine

## 2020-07-03 VITALS — BP 110/60 | HR 66 | Temp 98.3°F | Resp 16 | Ht 65.0 in | Wt 128.6 lb

## 2020-07-03 VITALS — BP 122/78 | HR 60 | Temp 98.3°F | Resp 16 | Ht 65.0 in | Wt 128.0 lb

## 2020-07-03 DIAGNOSIS — Z23 Encounter for immunization: Secondary | ICD-10-CM | POA: Diagnosis not present

## 2020-07-03 DIAGNOSIS — I10 Essential (primary) hypertension: Secondary | ICD-10-CM

## 2020-07-03 DIAGNOSIS — F320 Major depressive disorder, single episode, mild: Secondary | ICD-10-CM | POA: Diagnosis not present

## 2020-07-03 DIAGNOSIS — Z1231 Encounter for screening mammogram for malignant neoplasm of breast: Secondary | ICD-10-CM

## 2020-07-03 DIAGNOSIS — Z0000 Encounter for general adult medical examination without abnormal findings: Secondary | ICD-10-CM

## 2020-07-03 DIAGNOSIS — E785 Hyperlipidemia, unspecified: Secondary | ICD-10-CM | POA: Diagnosis not present

## 2020-07-03 MED ORDER — BUPROPION HCL ER (XL) 300 MG PO TB24: 300.0000 mg | ORAL_TABLET | Freq: Every day | ORAL | 1 refills | Status: DC

## 2020-07-03 MED ORDER — LOSARTAN POTASSIUM 100 MG PO TABS: 100.0000 mg | ORAL_TABLET | Freq: Every day | ORAL | 1 refills | Status: DC

## 2020-07-03 MED ORDER — LIVALO 2 MG PO TABS: 1.0000 | ORAL_TABLET | Freq: Every day | ORAL | 1 refills | Status: AC

## 2020-07-03 NOTE — Patient Instructions (Signed)

## 2020-07-03 NOTE — Patient Instructions (Addendum)
[name] , Thank you for taking time to come for your Medicare Wellness Visit. I appreciate your ongoing commitment to your health goals. Please review the following plan we discussed and let me know if I can assist you in the future.   Screening recommendations/referrals: Colonoscopy: 10/20/2018; due every 5 years Mammogram: 06/2019 Bone Density: 06/2019 Recommended yearly ophthalmology/optometry visit for glaucoma screening and checkup Recommended yearly dental visit for hygiene and checkup  Vaccinations: Influenza vaccine: 07/03/2020 Pneumococcal vaccine: completed Tdap vaccine: 11/25/2012; due every 10 years Shingles vaccine: completed Covid-19: completed  Advanced directives: Please bring a copy of your health care power of attorney and living will to the office at your convenience.  Conditions/risks identified: Yes; Reviewed health maintenance screenings with patient today and relevant education, vaccines, and/or referrals were provided. Please continue to do your personal lifestyle choices by: daily care of teeth and gums, regular physical activity (goal should be 5 days a week for 30 minutes), eat a healthy diet, avoid tobacco and drug use, limiting any alcohol intake, taking a low-dose aspirin (if not allergic or have been advised by your provider otherwise) and taking vitamins and minerals as recommended by your provider. Continue doing brain stimulating activities (puzzles, reading, adult coloring books, staying active) to keep memory sharp. Continue to eat heart healthy diet (full of fruits, vegetables, whole grains, lean protein, water--limit salt, fat, and sugar intake) and increase physical activity as tolerated.  Next appointment: Please schedule your next Medicare Wellness Visit with your Nurse Health Advisor in 1 year.   Preventive Care 68 Years and Older, Female Preventive care refers to lifestyle choices and visits with your health care provider that can promote health and  wellness. What does preventive care include?  A yearly physical exam. This is also called an annual well check.  Dental exams once or twice a year.  Routine eye exams. Ask your health care provider how often you should have your eyes checked.  Personal lifestyle choices, including:  Daily care of your teeth and gums.  Regular physical activity.  Eating a healthy diet.  Avoiding tobacco and drug use.  Limiting alcohol use.  Practicing safe sex.  Taking low-dose aspirin every day.  Taking vitamin and mineral supplements as recommended by your health care provider. What happens during an annual well check? The services and screenings done by your health care provider during your annual well check will depend on your age, overall health, lifestyle risk factors, and family history of disease. Counseling  Your health care provider may ask you questions about your:  Alcohol use.  Tobacco use.  Drug use.  Emotional well-being.  Home and relationship well-being.  Sexual activity.  Eating habits.  History of falls.  Memory and ability to understand (cognition).  Work and work Statistician.  Reproductive health. Screening  You may have the following tests or measurements:  Height, weight, and BMI.  Blood pressure.  Lipid and cholesterol levels. These may be checked every 5 years, or more frequently if you are over 68 years old.  Skin check.  Lung cancer screening. You may have this screening every year starting at age 68 if you have a 30-pack-year history of smoking and currently smoke or have quit within the past 15 years.  Fecal occult blood test (FOBT) of the stool. You may have this test every year starting at age 68.  Flexible sigmoidoscopy or colonoscopy. You may have a sigmoidoscopy every 5 years or a colonoscopy every 10 years starting at age  68.  Hepatitis C blood test.  Hepatitis B blood test.  Sexually transmitted disease (STD)  testing.  Diabetes screening. This is done by checking your blood sugar (glucose) after you have not eaten for a while (fasting). You may have this done every 1-3 years.  Bone density scan. This is done to screen for osteoporosis. You may have this done starting at age 68.  Mammogram. This may be done every 1-2 years. Talk to your health care provider about how often you should have regular mammograms. Talk with your health care provider about your test results, treatment options, and if necessary, the need for more tests. Vaccines  Your health care provider may recommend certain vaccines, such as:  Influenza vaccine. This is recommended every year.  Tetanus, diphtheria, and acellular pertussis (Tdap, Td) vaccine. You may need a Td booster every 10 years.  Zoster vaccine. You may need this after age 68.  Pneumococcal 13-valent conjugate (PCV13) vaccine. One dose is recommended after age 74.  Pneumococcal polysaccharide (PPSV23) vaccine. One dose is recommended after age 67. Talk to your health care provider about which screenings and vaccines you need and how often you need them. This information is not intended to replace advice given to you by your health care provider. Make sure you discuss any questions you have with your health care provider. Document Released: 11/01/2015 Document Revised: 06/24/2016 Document Reviewed: 08/06/2015 Elsevier Interactive Patient Education  2017 [place] Prevention in the Home Falls can cause injuries. They can happen to people of all ages. There are many things you can do to make your home safe and to help prevent falls. What can I do on the outside of my home?  Regularly fix the edges of walkways and driveways and fix any cracks.  Remove anything that might make you trip as you walk through a door, such as a raised step or threshold.  Trim any bushes or trees on the path to your home.  Use bright outdoor lighting.  Clear any walking  paths of anything that might make someone trip, such as rocks or tools.  Regularly check to see if handrails are loose or broken. Make sure that both sides of any steps have handrails.  Any raised decks and porches should have guardrails on the edges.  Have any leaves, snow, or ice cleared regularly.  Use sand or salt on walking paths during winter.  Clean up any spills in your garage right away. This includes oil or grease spills. What can I do in the bathroom?  Use night lights.  Install grab bars by the toilet and in the tub and shower. Do not use towel bars as grab bars.  Use non-skid mats or decals in the tub or shower.  If you need to sit down in the shower, use a plastic, non-slip stool.  Keep the floor dry. Clean up any water that spills on the floor as soon as it happens.  Remove soap buildup in the tub or shower regularly.  Attach bath mats securely with double-sided non-slip rug tape.  Do not have throw rugs and other things on the floor that can make you trip. What can I do in the bedroom?  Use night lights.  Make sure that you have a light by your bed that is easy to reach.  Do not use any sheets or blankets that are too big for your bed. They should not hang down onto the floor.  Have a firm chair that  has side arms. You can use this for support while you get dressed.  Do not have throw rugs and other things on the floor that can make you trip. What can I do in the kitchen?  Clean up any spills right away.  Avoid walking on wet floors.  Keep items that you use a lot in easy-to-reach places.  If you need to reach something above you, use a strong step stool that has a grab bar.  Keep electrical cords out of the way.  Do not use floor polish or wax that makes floors slippery. If you must use wax, use non-skid floor wax.  Do not have throw rugs and other things on the floor that can make you trip. What can I do with my stairs?  Do not leave any items  on the stairs.  Make sure that there are handrails on both sides of the stairs and use them. Fix handrails that are broken or loose. Make sure that handrails are as long as the stairways.  Check any carpeting to make sure that it is firmly attached to the stairs. Fix any carpet that is loose or worn.  Avoid having throw rugs at the top or bottom of the stairs. If you do have throw rugs, attach them to the floor with carpet tape.  Make sure that you have a light switch at the top of the stairs and the bottom of the stairs. If you do not have them, ask someone to add them for you. What else can I do to help prevent falls?  Wear shoes that:  Do not have high heels.  Have rubber bottoms.  Are comfortable and fit you well.  Are closed at the toe. Do not wear sandals.  If you use a stepladder:  Make sure that it is fully opened. Do not climb a closed stepladder.  Make sure that both sides of the stepladder are locked into place.  Ask someone to hold it for you, if possible.  Clearly mark and make sure that you can see:  Any grab bars or handrails.  First and last steps.  Where the edge of each step is.  Use tools that help you move around (mobility aids) if they are needed. These include:  Canes.  Walkers.  Scooters.  Crutches.  Turn on the lights when you go into a dark area. Replace any light bulbs as soon as they burn out.  Set up your furniture so you have a clear path. Avoid moving your furniture around.  If any of your floors are uneven, fix them.  If there are any pets around you, be aware of where they are.  Review your medicines with your doctor. Some medicines can make you feel dizzy. This can increase your chance of falling. Ask your doctor what other things that you can do to help prevent falls. This information is not intended to replace advice given to you by your health care provider. Make sure you discuss any questions you have with your health care  provider. Document Released: 08/01/2009 Document Revised: 03/12/2016 Document Reviewed: 11/09/2014 Elsevier Interactive Patient Education  2017 [employer].

## 2020-07-03 NOTE — Progress Notes (Signed)
Subjective:  Patient ID: Debbie Hogan, female    DOB: [birth year]  Age: 69 y.o. MRN: [id number]  CC: Hypertension, Depression, and Hyperlipidemia  This visit occurred during the SARS-CoV-2 public health emergency.  Safety protocols were in place, including screening questions prior to the visit, additional usage of staff PPE, and extensive cleaning of exam room while observing appropriate contact time as indicated for disinfecting solutions.    HPI Debbie Hogan presents for f/up - She wants to increase the dose of bupropion.  She tells me she is taking losartan at 100 mg a day to control her blood pressure.  She is tolerating the statin well with no muscle or joint aches.  Her back pain has improved significantly.  She is active and denies any recent episodes of chest pain, shortness of breath, palpitations, or edema.  Outpatient Medications Prior to Visit  Medication Sig Dispense Refill  . albuterol (PROAIR HFA) 108 (90 Base) MCG/ACT inhaler USE 2 INHALATIONS INTO THE LUNGS  EVERY 6 HOURS AS NEEDED FOR WHEEZING OR SHORTNESS OF BREATH 54 g 4  . calcium citrate-vitamin D (CITRACAL+D) 315-200 MG-UNIT per tablet Take 1 tablet by mouth every other day.     . estradiol (VIVELLE-DOT) 0.0375 MG/24HR Place 1 patch onto the skin 2 (two) times a week. Place on Monday and Thursday    . fluticasone (FLONASE) 50 MCG/ACT nasal spray Place 2 sprays into both nostrils daily. 48 g 3  . Fluticasone-Salmeterol (ADVAIR DISKUS) 100-50 MCG/DOSE AEPB Inhale 1 puff into the lungs 2 (two) times daily as needed. 3 each 3  . Gabapentin, Once-Daily, (GRALISE) 300 MG TABS Take 1 tablet by mouth every evening. (Patient taking differently: Take 2 tablets by mouth every evening. ) 9 tablet 0  . guaiFENesin (MUCINEX) 600 MG 12 hr tablet Take 600 mg by mouth daily.    Marland Kitchen HYDROcodone-acetaminophen (NORCO/VICODIN) 5-325 MG tablet Take 0.5 tablets by mouth every 4 (four) hours as needed.     Marland Kitchen LORazepam (ATIVAN) 0.5 MG tablet  TAKE 1 TABLET(0.5 MG) BY MOUTH EVERY 8 HOURS AS NEEDED FOR ANXIETY 90 tablet 5  . progesterone (PROMETRIUM) 200 MG capsule progesterone micronized 200 mg capsule  TAKE 1 CAPSULE ON DAYS 1 TO 12 OF EVERY MONTH    . vitamin B-12 (CYANOCOBALAMIN) 100 MCG tablet Take 100 mcg by mouth daily.    . Azilsartan Medoxomil (EDARBI) 40 MG TABS Take 1 tablet by mouth daily. 70 tablet 0  . buPROPion (WELLBUTRIN XL) 150 MG 24 hr tablet TAKE 1 TABLET DAILY 90 tablet 1  . LIVALO 2 MG TABS TAKE 1 TABLET DAILY 90 tablet 1   No facility-administered medications prior to visit.    ROS Review of Systems  Constitutional: Negative.  Negative for diaphoresis and fatigue.  HENT: Negative.   Eyes: Negative.   Respiratory: Negative for cough, chest tightness, shortness of breath and wheezing.   Cardiovascular: Negative for chest pain, palpitations and leg swelling.  Gastrointestinal: Negative for abdominal pain, constipation, diarrhea, nausea and vomiting.  Endocrine: Negative.   Genitourinary: Negative.  Negative for difficulty urinating.  Musculoskeletal: Positive for back pain. Negative for arthralgias and myalgias.  Skin: Negative.  Negative for color change and pallor.  Neurological: Negative.  Negative for dizziness, weakness, light-headedness and headaches.  Hematological: Negative for adenopathy. Does not bruise/bleed easily.  Psychiatric/Behavioral: Negative.     Objective:  BP 122/78   Pulse 60   Temp 98.3 F (36.8 C) (Oral)   Resp 16   Ht  5\' 5"  (1.651 m)   Wt 128 lb (58.1 kg)   SpO2 98%   BMI 21.30 kg/m   BP Readings from Last 3 Encounters:  07/03/20 122/78  07/03/20 110/60  03/27/20 (!) 160/90    Wt Readings from Last 3 Encounters:  07/03/20 128 lb (58.1 kg)  07/03/20 128 lb 9.6 oz (58.3 kg)  03/27/20 134 lb 4 oz (60.9 kg)    Physical Exam Vitals reviewed.  Constitutional:      Appearance: Normal appearance.  HENT:     Nose: Nose normal.     Mouth/Throat:     Mouth: Mucous  membranes are moist.  Eyes:     General: No scleral icterus.    Conjunctiva/sclera: Conjunctivae normal.  Cardiovascular:     Rate and Rhythm: Normal rate and regular rhythm.     Heart sounds: No murmur heard.   Pulmonary:     Effort: Pulmonary effort is normal.     Breath sounds: No stridor. No wheezing, rhonchi or rales.  Abdominal:     General: Abdomen is flat. Bowel sounds are normal. There is no distension.     Palpations: Abdomen is soft. There is no hepatomegaly, splenomegaly or mass.  Musculoskeletal:        General: Normal range of motion.     Cervical back: Neck supple.     Right lower leg: No edema.     Left lower leg: No edema.  Lymphadenopathy:     Cervical: No cervical adenopathy.  Skin:    General: Skin is warm.  Neurological:     General: No focal deficit present.     Mental Status: She is alert.  Psychiatric:        Mood and Affect: Mood normal.        Behavior: Behavior normal.     Lab Results  Component Value Date   WBC 10.8 07/03/2020   HGB 12.9 07/03/2020   HCT 39.4 07/03/2020   PLT 361 07/03/2020   GLUCOSE 91 07/03/2020   CHOL 202 (H) 07/03/2020   TRIG 97 07/03/2020   HDL 97 07/03/2020   LDLDIRECT 133.7 01/08/2010   LDLCALC 86 07/03/2020   ALT 18 07/03/2020   AST 15 07/03/2020   NA 140 07/03/2020   K 3.8 07/03/2020   CL 104 07/03/2020   CREATININE 0.60 07/03/2020   BUN 18 07/03/2020   CO2 29 07/03/2020   TSH 1.36 06/29/2019   HGBA1C 5.5 02/07/2013    MR Lumbar Spine Wo Contrast  Result Date: 02/22/2020 CLINICAL DATA:  Low back pain and right hip pain extending down the right leg. EXAM: MRI LUMBAR SPINE WITHOUT CONTRAST TECHNIQUE: Multiplanar, multisequence MR imaging of the lumbar spine was performed. No intravenous contrast was administered. COMPARISON:  Radiographs from 01/05/2020 FINDINGS: Segmentation: The lowest lumbar type non-rib-bearing vertebra is labeled as L5. Alignment:  5 mm of degenerative anterolisthesis at L5-S1 Vertebrae:  Disc desiccation at L5-S1 with loss of disc height. Degenerative facet edema bilaterally at L5-S1 Conus medullaris and cauda equina: Conus extends to the L1 level. Conus and cauda equina appear normal. Paraspinal and other soft tissues: Unremarkable Disc levels: L1-2: Unremarkable. L2-3: Unremarkable. L3-4: No impingement. Mild disc bulge. L4-5: Mild left subarticular lateral recess stenosis and borderline bilateral foraminal stenosis as well as borderline central narrowing of the thecal sac due to disc bulge and facet arthropathy. L5-S1: Mild bilateral foraminal stenosis and mild central narrowing of the thecal sac due to disc uncovering, disc bulge, and intervertebral spurring. There  is also left greater than right degenerative facet arthropathy with facet edema. IMPRESSION: 1. Lumbar spondylosis and degenerative disc disease, causing mild impingement at L4-5 and L5-S1. 2. Degenerative facet edema bilaterally at L5-S1 with facet edema. Electronically Signed   By: Van Clines M.D.   On: 02/22/2020 15:30    Assessment & Plan:   Debbie Hogan was seen today for hypertension, depression and hyperlipidemia.  Diagnoses and all orders for this visit:  Flu vaccine need -     Flu Vaccine QUAD High Dose(Fluad)  Essential hypertension- Her blood pressure is well controlled.  Electrolytes and renal function are normal.  Will continue losartan at the current dose. -     CBC with Differential/Platelet; Future -     BASIC METABOLIC PANEL WITH GFR; Future -     losartan (COZAAR) 100 MG tablet; Take 1 tablet (100 mg total) by mouth daily. -     BASIC METABOLIC PANEL WITH GFR -     CBC with Differential/Platelet  Hyperlipidemia with target LDL less than 130- She has achieved her LDL goal and is doing well on the statin. -     Pitavastatin Calcium (LIVALO) 2 MG TABS; Take 1 tablet (2 mg total) by mouth daily. -     Lipid panel; Future -     Hepatic function panel; Future -     Hepatic function panel -      Lipid panel  Current mild episode of major depressive disorder without prior episode (HCC) -     buPROPion (WELLBUTRIN XL) 300 MG 24 hr tablet; Take 1 tablet (300 mg total) by mouth daily.  Visit for screening mammogram -     MM DIGITAL SCREENING BILATERAL; Future   I have discontinued Cyntha Brickman "Jenny"'s buPROPion and Edarbi. I have also changed her Livalo. Additionally, I am having her start on buPROPion and losartan. Lastly, I am having her maintain her calcium citrate-vitamin D, estradiol, guaiFENesin, progesterone, Fluticasone-Salmeterol, fluticasone, albuterol, HYDROcodone-acetaminophen, Gralise, LORazepam, and vitamin B-12.  Meds ordered this encounter  Medications  . Pitavastatin Calcium (LIVALO) 2 MG TABS    Sig: Take 1 tablet (2 mg total) by mouth daily.    Dispense:  90 tablet    Refill:  1  . buPROPion (WELLBUTRIN XL) 300 MG 24 hr tablet    Sig: Take 1 tablet (300 mg total) by mouth daily.    Dispense:  90 tablet    Refill:  1  . losartan (COZAAR) 100 MG tablet    Sig: Take 1 tablet (100 mg total) by mouth daily.    Dispense:  90 tablet    Refill:  1     Follow-up: Return in about 6 months (around 12/31/2020).  Scarlette Calico, MD

## 2020-07-03 NOTE — Progress Notes (Signed)
Subjective:   Debbie Hogan is a 69 y.o. female who presents for Medicare Annual (Subsequent) preventive examination.  Review of Systems    No ROS. Medicare Wellness Visit Cardiac Risk Factors include: advanced age (>29men, >15 women);dyslipidemia;family history of premature cardiovascular disease;hypertension     Objective:    Today's Vitals   07/03/20 0954  BP: 110/60  Pulse: 66  Resp: 16  Temp: 98.3 F (36.8 C)  SpO2: 98%  Weight: 128 lb 9.6 oz (58.3 kg)  Height: 5\' 5"  (1.651 m)  PainSc: 0-No pain   Body mass index is 21.4 kg/m.  Advanced Directives 07/03/2020 06/27/2019 05/23/2017 02/02/2016 08/06/2015 02/09/2013  Does Patient Have a Medical Advance Directive? Yes Yes Yes No Yes Patient has advance directive, copy not in chart  Type of Advance Directive Living will;Healthcare Power of [place];Living will [place];Living will - Living will [place];Living will  Does patient want to make changes to medical advance directive? No - Patient declined - - - - -  Copy of [place] in Chart? No - copy requested No - copy requested Yes - - Copy requested from family  Would patient like information on creating a medical advance directive? - - - No - patient declined information - -  Pre-existing out of facility DNR order (yellow form or pink MOST form) - - - - - No    Current Medications (verified) Outpatient Encounter Medications as of 07/03/2020  Medication Sig  . albuterol (PROAIR HFA) 108 (90 Base) MCG/ACT inhaler USE 2 INHALATIONS INTO THE LUNGS  EVERY 6 HOURS AS NEEDED FOR WHEEZING OR SHORTNESS OF BREATH  . calcium citrate-vitamin D (CITRACAL+D) 315-200 MG-UNIT per tablet Take 1 tablet by mouth every other day.   . fluticasone (FLONASE) 50 MCG/ACT nasal spray Place 2 sprays into both nostrils daily.  . Fluticasone-Salmeterol (ADVAIR DISKUS) 100-50 MCG/DOSE AEPB Inhale 1 puff into the lungs 2  (two) times daily as needed.  . Gabapentin, Once-Daily, (GRALISE) 300 MG TABS Take 1 tablet by mouth every evening. (Patient taking differently: Take 2 tablets by mouth every evening. )  . guaiFENesin (MUCINEX) 600 MG 12 hr tablet Take 600 mg by mouth daily.  Marland Kitchen HYDROcodone-acetaminophen (NORCO/VICODIN) 5-325 MG tablet Take 0.5 tablets by mouth every 4 (four) hours as needed.   Marland Kitchen LORazepam (ATIVAN) 0.5 MG tablet TAKE 1 TABLET(0.5 MG) BY MOUTH EVERY 8 HOURS AS NEEDED FOR ANXIETY  . progesterone (PROMETRIUM) 200 MG capsule progesterone micronized 200 mg capsule  TAKE 1 CAPSULE ON DAYS 1 TO 12 OF EVERY MONTH  . vitamin B-12 (CYANOCOBALAMIN) 100 MCG tablet Take 100 mcg by mouth daily.  . [DISCONTINUED] buPROPion (WELLBUTRIN XL) 150 MG 24 hr tablet TAKE 1 TABLET DAILY  . [DISCONTINUED] LIVALO 2 MG TABS TAKE 1 TABLET DAILY  . estradiol (VIVELLE-DOT) 0.0375 MG/24HR Place 1 patch onto the skin 2 (two) times a week. Place on Monday and Thursday  . [DISCONTINUED] Azilsartan Medoxomil (EDARBI) 40 MG TABS Take 1 tablet by mouth daily.  . [DISCONTINUED] buPROPion (WELLBUTRIN XL) 150 MG 24 hr tablet TAKE 1 TABLET DAILY  . [DISCONTINUED] doxycycline (VIBRA-TABS) 100 MG tablet Take 1 tablet (100 mg total) by mouth 2 (two) times daily.  . [DISCONTINUED] LORazepam (ATIVAN) 0.5 MG tablet Take 1 tablet (0.5 mg total) by mouth every 8 (eight) hours as needed for anxiety.  . [DISCONTINUED] losartan (COZAAR) 100 MG tablet Take 0.5 tablets (50 mg total) by mouth  daily.  . [DISCONTINUED] Nabumetone (RELAFEN DS) 1000 MG TABS Take 2 tablets by mouth daily.  . [DISCONTINUED] Pitavastatin Calcium (LIVALO) 2 MG TABS Livalo 2 mg tablet   No facility-administered encounter medications on file as of 07/03/2020.    Allergies (verified) Atorvastatin, Kiwi extract, Montelukast sodium, Other, and Montelukast sodium   History: Past Medical History:  Diagnosis Date  . 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 Anxiety    PMH of  . Colonic polyp 2008  . DVT (deep venous thrombosis) ([place])    LLE 2007 (post trauma and prolonged air travel), Lovenox x 6 months  . Hyperlipemia    see NMR data  . Hypertension   . Kidney stone    X1; spontaneous passgae  . Migraine   . MVP (mitral valve prolapse)    Dr.Brackbill;Cardiology   Past Surgical History:  Procedure Laterality Date  . COLONOSCOPY  last 11/21/2007   Dr.Medoff-hyperplastic polyp x 1   . COLONOSCOPY W/ POLYPECTOMY  2009   Dr Earle Gell ,[facility]  . DILATION AND CURETTAGE OF UTERUS  2014   neg pathology  . NASAL SINUS SURGERY  2009   polyps;Dr.Rosen  . OOPHORECTOMY     3  benign cysts  . TONSILLECTOMY     Family History  Problem Relation Age of Onset  . Dementia Father   . Asthma Father   . Depression Father   . Alcohol abuse Father   . Hypotension Father   . Hypertension Mother        MGM   . Depression Mother   . Hypertension Sister   . Alcohol abuse Sister   . Colon polyps Sister   . Depression Sister        X 2  . Alcohol abuse Sister   . Alzheimer's disease Paternal Uncle   . Ovarian cancer Maternal Aunt   . Alcohol abuse Sister        X 2  . Diabetes Neg Hx   . Stroke Neg Hx   . Heart attack Neg Hx   . Cancer Neg Hx   . Early death Neg Hx   . Colon cancer Neg Hx   . Esophageal cancer Neg Hx   . Rectal cancer Neg Hx   . Stomach cancer Neg Hx    Social History   Socioeconomic History  . Marital status: Widowed    Spouse name: Not on file  . Number of children: 2  . Years of education: Not on file  . Highest education level: Not on file  Occupational History  . Occupation: retired  Tobacco Use  . Smoking status: Former Smoker    Quit date: 10/19/1980    Years since quitting: 39.7  . Smokeless tobacco: Never Used  . Tobacco comment: Positive history of passive tobacco smoke exposure until 1990.Smoked [phone], up to 1/2 ppd  Vaping Use  . Vaping Use: Never used  Substance and  Sexual Activity  . Alcohol use: Yes    Alcohol/week: 5.0 standard drinks    Types: 5 Glasses of wine per week    Comment: Social alcohol    . Drug use: No  . Sexual activity: Not Currently  Other Topics Concern  . Not on file  Social History Narrative   widowed   Social Determinants of Health   Financial Resource Strain: Low Risk   . Difficulty of Paying Living Expenses: Not hard at all  Food Insecurity: No Food Insecurity  .  Worried About Charity fundraiser in the Last Year: Never true  . Ran Out of Food in the Last Year: Never true  Transportation Needs: No Transportation Needs  . Lack of Transportation (Medical): No  . Lack of Transportation (Non-Medical): No  Physical Activity: Sufficiently Active  . Days of Exercise per Week: 5 days  . Minutes of Exercise per Session: 30 min  Stress: No Stress Concern Present  . Feeling of Stress : Not at all  Social Connections: Moderately Integrated  . Frequency of Communication with Friends and Family: More than three times a week  . Frequency of Social Gatherings with Friends and Family: More than three times a week  . Attends Religious Services: More than 4 times per year  . Active Member of Clubs or Organizations: Yes  . Attends Archivist Meetings: More than 4 times per year  . Marital Status: Widowed    Tobacco Counseling Counseling given: Not Answered Comment: Positive history of passive tobacco smoke exposure until 1990.Smoked [phone], up to 1/2 ppd   Clinical Intake:  Pre-visit preparation completed: Yes  Pain : No/denies pain Pain Score: 0-No pain     BMI - recorded: 21.4 Nutritional Status: BMI of 19-24  Normal Nutritional Risks: None Diabetes: No  How often do you need to have someone help you when you read instructions, pamphlets, or other written materials from your doctor or pharmacy?: 1 - Never What is the last grade level you completed in school?: Master's degree in Business  Diabetic?  no  Interpreter Needed?: No  Information entered by :: Louie Meaders N. Madellyn Denio, LPN   Activities of Daily Living In your present state of health, do you have any difficulty performing the following activities: 07/03/2020  Hearing? N  Vision? N  Difficulty concentrating or making decisions? N  Walking or climbing stairs? N  Dressing or bathing? N  Doing errands, shopping? N  Preparing Food and eating ? N  Using the Toilet? N  In the past six months, have you accidently leaked urine? N  Do you have problems with loss of bowel control? N  Managing your Medications? N  Managing your Finances? N  Housekeeping or managing your Housekeeping? N  Some recent data might be hidden    Patient Care Team: Janith Lima, MD as PCP - General (Internal Medicine)  Indicate any recent Medical Services you may have received from other than [facility] providers in the past year (date may be approximate).     Assessment:   This is a routine wellness examination for Elleanna.  Hearing/Vision screen No exam data present  Dietary issues and exercise activities discussed: Current Exercise Habits: Home exercise routine, Type of exercise: walking, Time (Minutes): 60, Frequency (Times/Week): 4, Weekly Exercise (Minutes/Week): 240, Intensity: Moderate, Exercise limited by: respiratory conditions(s)  Goals    .  Patient Stated (pt-stated)      To maintain my current health status by continuing to eat healthy, stay physically active & socially active.      Depression Screen PHQ 2/9 Scores 07/03/2020 03/27/2020 06/27/2019 06/07/2018 05/23/2017 05/19/2017 04/15/2017  PHQ - 2 Score 0 0 0 0 0 1 0  PHQ- 9 Score - 4 1 3  - 2 2    Fall Risk Fall Risk  07/03/2020 06/29/2019 06/27/2019 09/08/2018 08/11/2017  Falls in the past year? 0 0 0 0 No  Comment - - - Emmi Telephone Survey: data to providers prior to load -  Number falls in past yr: 0  0 0 - -  Injury with Fall? 0 0 - - -  Risk for fall due to : No Fall Risks - - - -   Follow up Falls evaluation completed;Education provided Falls evaluation completed - - -    Any stairs in or around the home? Yes  If so, are there any without handrails? No  Home free of loose throw rugs in walkways, pet beds, electrical cords, etc? Yes  Adequate lighting in your home to reduce risk of falls? Yes   ASSISTIVE DEVICES UTILIZED TO PREVENT FALLS:  Life alert? No  Use of a cane, walker or w/c? No  Grab bars in the bathroom? No  Shower chair or bench in shower? Yes  Elevated toilet seat or a handicapped toilet? Yes   TIMED UP AND GO:  Was the test performed? No .  Length of time to ambulate 10 feet: 0 sec.   Gait steady and fast without use of assistive device  Cognitive Function:     6CIT Screen 07/03/2020  What Year? 0 points  What month? 0 points  What time? 0 points  Count back from 20 0 points  Months in reverse 0 points  Repeat phrase 0 points  Total Score 0    Immunizations Immunization History  Administered Date(s) Administered  . Fluad Quad(high Dose 65+) 06/29/2019, 07/03/2020  . Hep A / Hep B 06/25/2005, 07/23/2005, 10/22/2008  . IPV 06/25/2005  . Influenza Split 08/09/2012, 09/07/2017, 09/05/2018  . Influenza Whole 07/11/2008, 07/19/2010, 07/20/2011  . Influenza,inj,Quad PF,6+ Mos 08/28/2013, 10/22/2014  . Influenza-Unspecified 08/23/2015, 08/12/2016  . Meningococcal Polysaccharide 11/25/2012  . PFIZER SARS-COV-2 Vaccination 11/07/2019, 11/27/2019  . Pneumococcal Conjugate-13 09/19/2015  . Pneumococcal Polysaccharide-23 10/20/2007, 05/01/2016  . Td 10/20/2003  . Tdap 11/25/2012  . Typhoid Inactivated 10/22/2008, 10/22/2017  . Yellow Fever 06/25/2005  . Zoster 07/19/2012  . Zoster Recombinat (Shingrix) 07/03/2019    TDAP status: Up to date Flu Vaccine status: Up to date Pneumococcal vaccine status: Up to date Covid-19 vaccine status: Completed vaccines  Qualifies for Shingles Vaccine? Yes   Zostavax completed Yes   Shingrix  Completed?: Yes  Screening Tests Health Maintenance  Topic Date Due  . MAMMOGRAM  05/24/2020  . PNA vac Low Risk Adult (2 of 2 - PPSV23) 05/01/2021  . TETANUS/TDAP  11/25/2022  . COLONOSCOPY  10/21/2023  . INFLUENZA VACCINE  Completed  . DEXA SCAN  Completed  . COVID-19 Vaccine  Completed  . Hepatitis C Screening  Completed    Health Maintenance  Health Maintenance Due  Topic Date Due  . MAMMOGRAM  05/24/2020    Colorectal cancer screening: Completed 10/20/2018. Repeat every 5 years Mammogram status: Completed 06/2019. Repeat every year Bone Density status: Completed 06/2019. Results reflect: Bone density results: NORMAL. Repeat every 2 years.  Lung Cancer Screening: (Low Dose CT Chest recommended if Age 39-80 years, 30 pack-year currently smoking OR have quit w/in 15years.) does not qualify.   Lung Cancer Screening Referral: no  Additional Screening:  Hepatitis C Screening: does qualify; Completed: yes  Vision Screening: Recommended annual ophthalmology exams for early detection of glaucoma and other disorders of the eye. Is the patient up to date with their annual eye exam?  Yes  Who is the provider or what is the name of the office in which the patient attends annual eye exams? Jola Schmidt, MD If pt is not established with a provider, would they like to be referred to a provider to establish care? No .  Dental Screening: Recommended annual dental exams for proper oral hygiene  Community Resource Referral / Chronic Care Management: CRR required this visit?  No   CCM required this visit?  No      Plan:     I have personally reviewed and noted the following in the patient's chart:   . Medical and social history . Use of alcohol, tobacco or illicit drugs  . Current medications and supplements . Functional ability and status . Nutritional status . Physical activity . Advanced directives . List of other physicians . Hospitalizations, surgeries, and ER visits  in previous 12 months . Vitals . Screenings to include cognitive, depression, and falls . Referrals and appointments  In addition, I have reviewed and discussed with patient certain preventive protocols, quality metrics, and best practice recommendations. A written personalized care plan for preventive services as well as general preventive health recommendations were provided to patient.     Sheral Flow, LPN   04/26/6437   Nurse Notes: n/a

## 2020-07-04 DIAGNOSIS — D259 Leiomyoma of uterus, unspecified: Secondary | ICD-10-CM | POA: Diagnosis not present

## 2020-07-04 DIAGNOSIS — N950 Postmenopausal bleeding: Secondary | ICD-10-CM | POA: Diagnosis not present

## 2020-07-04 LAB — LIPID PANEL
Cholesterol: 202 mg/dL — ABNORMAL HIGH (ref <200)
HDL: 97 mg/dL (ref >=50)
LDL Cholesterol (Calc): 86 mg/dL (calc)
Non-HDL Cholesterol (Calc): 105 mg/dL (calc) (ref <130)
Total CHOL/HDL Ratio: 2.1 (calc) (ref <5.0)
Triglycerides: 97 mg/dL (ref <150)

## 2020-07-04 LAB — CBC WITH DIFFERENTIAL/PLATELET
Absolute Monocytes: 961 cells/uL — ABNORMAL HIGH (ref 200–950)
Basophils Absolute: 32 cells/uL (ref 0–200)
Basophils Relative: 0.3 %
Eosinophils Absolute: 32 cells/uL (ref 15–500)
Eosinophils Relative: 0.3 %
HCT: 39.4 % (ref 35.0–45.0)
Hemoglobin: 12.9 g/dL (ref 11.7–15.5)
Lymphs Abs: 1987 cells/uL (ref 850–3900)
MCH: 31.2 pg (ref 27.0–33.0)
MCHC: 32.7 g/dL (ref 32.0–36.0)
MCV: 95.2 fL (ref 80.0–100.0)
MPV: 10 fL (ref 7.5–12.5)
Monocytes Relative: 8.9 %
Neutro Abs: 7787 cells/uL (ref 1500–7800)
Neutrophils Relative %: 72.1 %
Platelets: 361 10*3/uL (ref 140–400)
RBC: 4.14 10*6/uL (ref 3.80–5.10)
RDW: 12.5 % (ref 11.0–15.0)
Total Lymphocyte: 18.4 %
WBC: 10.8 10*3/uL (ref 3.8–10.8)

## 2020-07-04 LAB — BASIC METABOLIC PANEL WITH GFR
BUN: 18 mg/dL (ref 7–25)
CO2: 29 mmol/L (ref 20–32)
Calcium: 9.9 mg/dL (ref 8.6–10.4)
Chloride: 104 mmol/L (ref 98–110)
Creat: 0.6 mg/dL (ref 0.50–0.99)
GFR, Est African American: 109 mL/min/{1.73_m2} (ref >=60)
GFR, Est Non African American: 94 mL/min/{1.73_m2} (ref >=60)
Glucose, Bld: 91 mg/dL (ref 65–99)
Potassium: 3.8 mmol/L (ref 3.5–5.3)
Sodium: 140 mmol/L (ref 135–146)

## 2020-07-04 LAB — HEPATIC FUNCTION PANEL
AG Ratio: 2.4 (calc) (ref 1.0–2.5)
ALT: 18 U/L (ref 6–29)
AST: 15 U/L (ref 10–35)
Albumin: 4.6 g/dL (ref 3.6–5.1)
Alkaline phosphatase (APISO): 33 U/L — ABNORMAL LOW (ref 37–153)
Bilirubin, Direct: 0.1 mg/dL (ref 0.0–0.2)
Globulin: 1.9 g/dL (calc) (ref 1.9–3.7)
Indirect Bilirubin: 0.5 mg/dL (calc) (ref 0.2–1.2)
Total Bilirubin: 0.6 mg/dL (ref 0.2–1.2)
Total Protein: 6.5 g/dL (ref 6.1–8.1)

## 2020-07-05 DIAGNOSIS — D259 Leiomyoma of uterus, unspecified: Secondary | ICD-10-CM | POA: Diagnosis not present

## 2020-07-19 DIAGNOSIS — Z09 Encounter for follow-up examination after completed treatment for conditions other than malignant neoplasm: Secondary | ICD-10-CM | POA: Diagnosis not present

## 2020-07-19 DIAGNOSIS — Z23 Encounter for immunization: Secondary | ICD-10-CM | POA: Diagnosis not present

## 2020-07-19 DIAGNOSIS — Z1231 Encounter for screening mammogram for malignant neoplasm of breast: Secondary | ICD-10-CM | POA: Diagnosis not present

## 2020-07-24 ENCOUNTER — Other Ambulatory Visit: Payer: Self-pay | Admitting: Obstetrics and Gynecology

## 2020-07-24 DIAGNOSIS — R928 Other abnormal and inconclusive findings on diagnostic imaging of breast: Secondary | ICD-10-CM

## 2020-07-30 DIAGNOSIS — M5136 Other intervertebral disc degeneration, lumbar region: Secondary | ICD-10-CM | POA: Diagnosis not present

## 2020-07-30 DIAGNOSIS — M47817 Spondylosis without myelopathy or radiculopathy, lumbosacral region: Secondary | ICD-10-CM | POA: Diagnosis not present

## 2020-07-30 DIAGNOSIS — M4317 Spondylolisthesis, lumbosacral region: Secondary | ICD-10-CM | POA: Diagnosis not present

## 2020-08-07 ENCOUNTER — Ambulatory Visit
Admission: RE | Admit: 2020-08-07 | Discharge: 2020-08-07 | Disposition: A | Discharge status: Home / Self Care | Payer: Medicare Other | Source: Ambulatory Visit | Attending: Obstetrics and Gynecology | Admitting: Obstetrics and Gynecology

## 2020-08-07 ENCOUNTER — Other Ambulatory Visit: Payer: Self-pay

## 2020-08-07 ENCOUNTER — Ambulatory Visit: Payer: Medicare Other

## 2020-08-07 DIAGNOSIS — R928 Other abnormal and inconclusive findings on diagnostic imaging of breast: Secondary | ICD-10-CM

## 2020-09-04 ENCOUNTER — Other Ambulatory Visit: Payer: Self-pay | Admitting: Internal Medicine

## 2020-09-04 DIAGNOSIS — F320 Major depressive disorder, single episode, mild: Secondary | ICD-10-CM

## 2020-09-23 DIAGNOSIS — M47817 Spondylosis without myelopathy or radiculopathy, lumbosacral region: Secondary | ICD-10-CM | POA: Diagnosis not present

## 2020-10-02 ENCOUNTER — Ambulatory Visit: Payer: Medicare Other | Admitting: Internal Medicine

## 2020-12-03 IMAGING — MG MM DIGITAL DIAGNOSTIC UNILAT*R* W/ TOMO W/ CAD
4 series · 4 of 12 positions shown · non-contrast
Comparison: Previous exam(s).

CLINICAL DATA: 69-year-old female presenting as a recall from
screening for possible right breast asymmetry.

EXAM:
DIGITAL DIAGNOSTIC UNILATERAL RIGHT MAMMOGRAM WITH TOMO AND CAD

[R ML synth-2D]
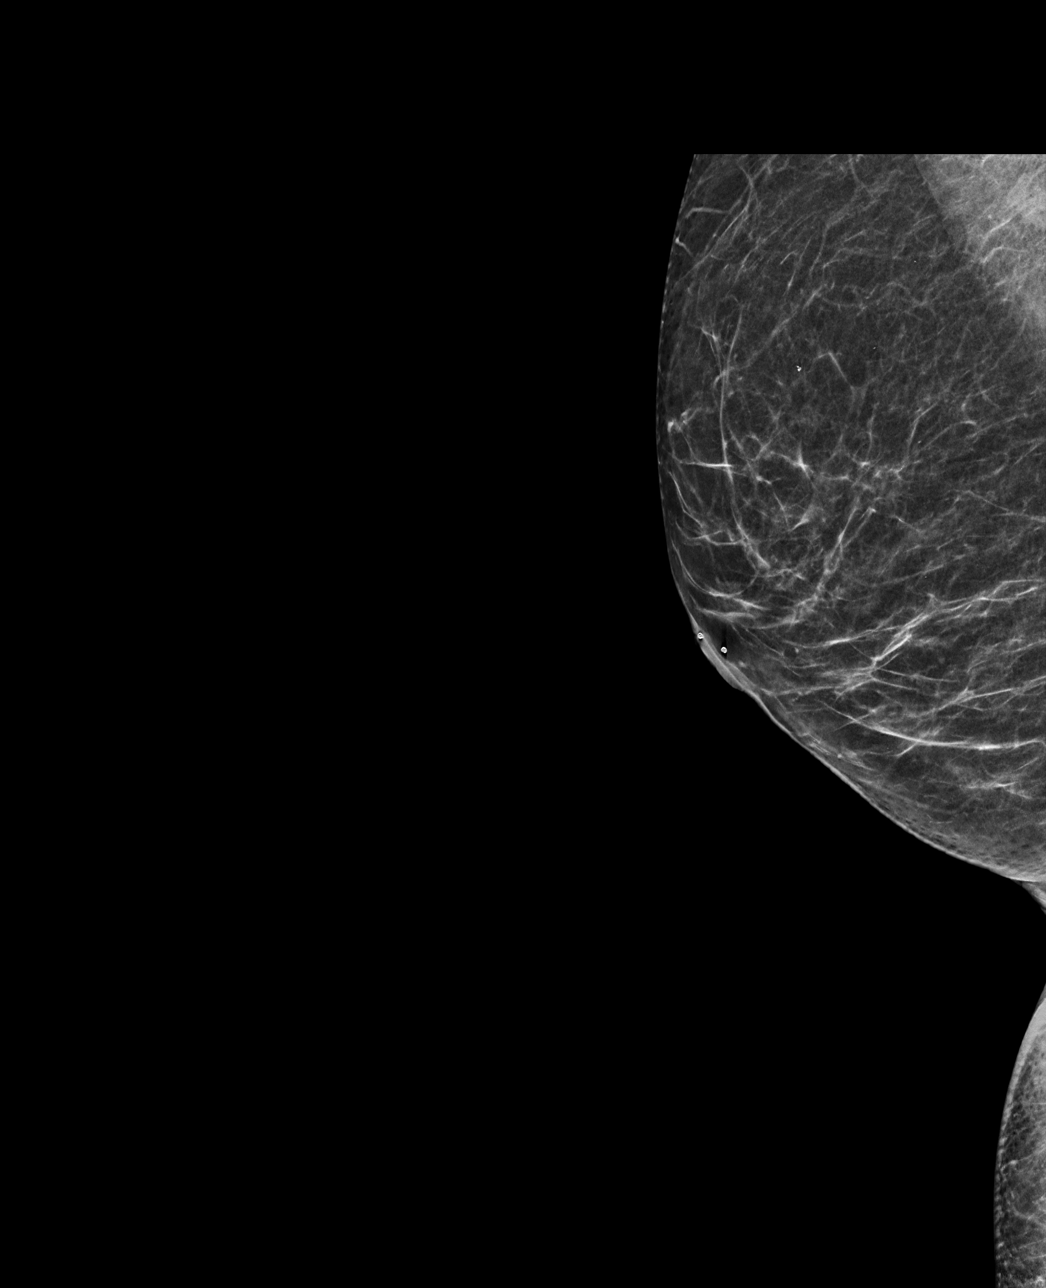

[R MLO synth-2D]
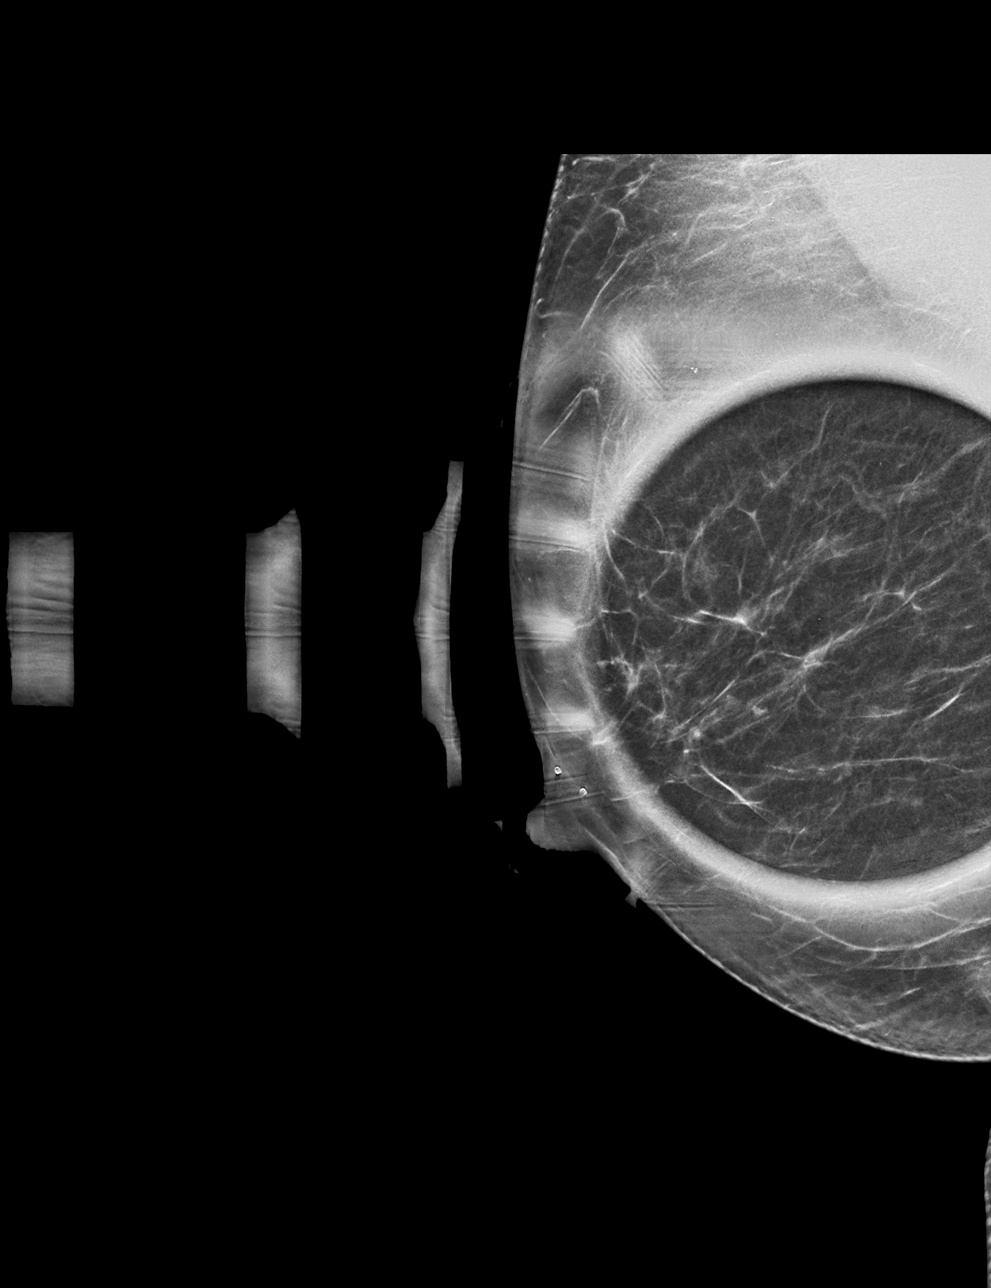

[R MLO tomo · tomo slice 28/55.0]
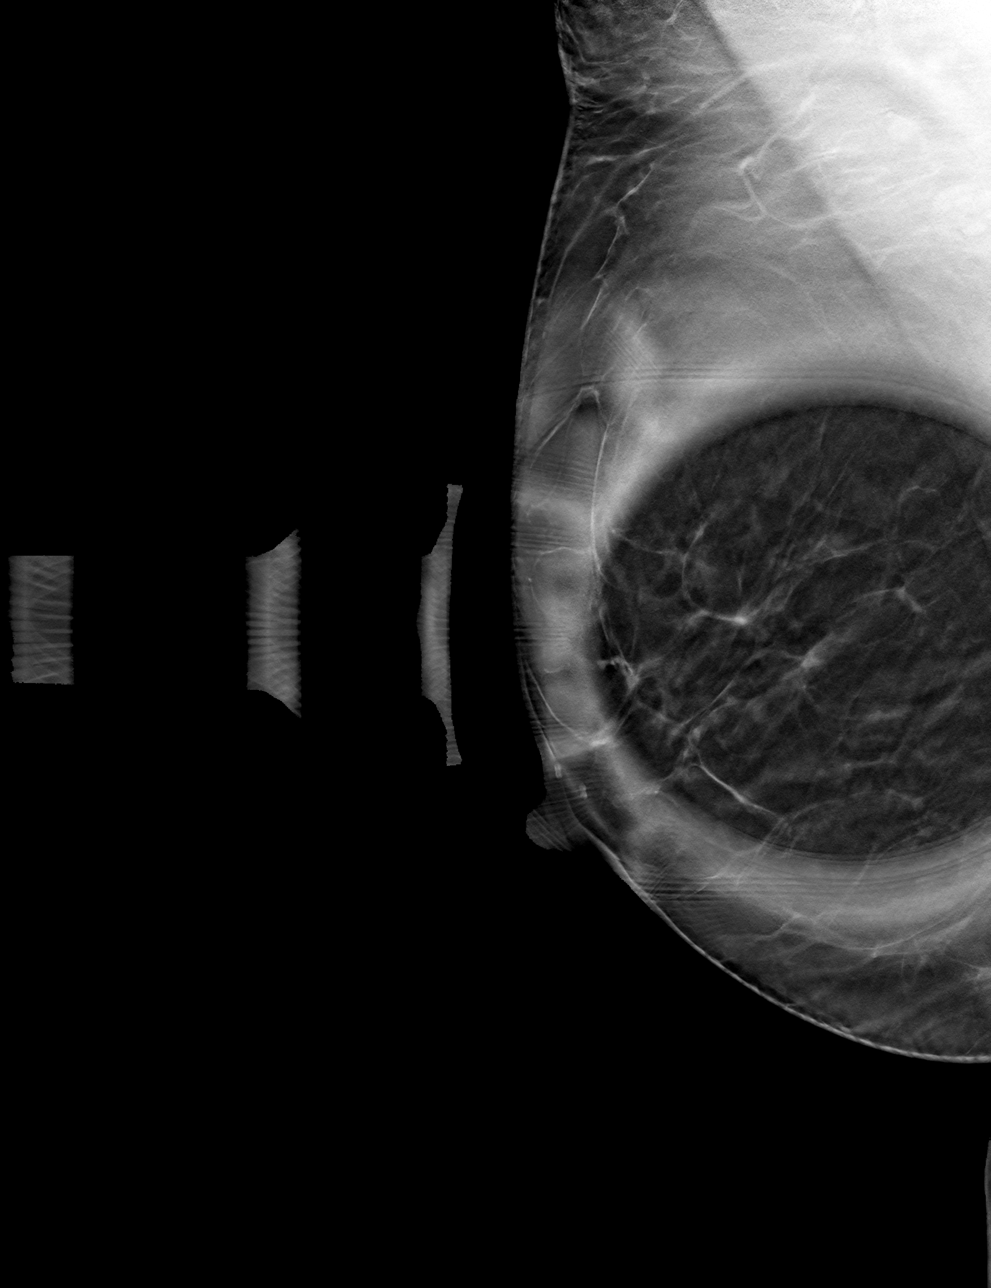

[R ML tomo · tomo slice 29/58.0]
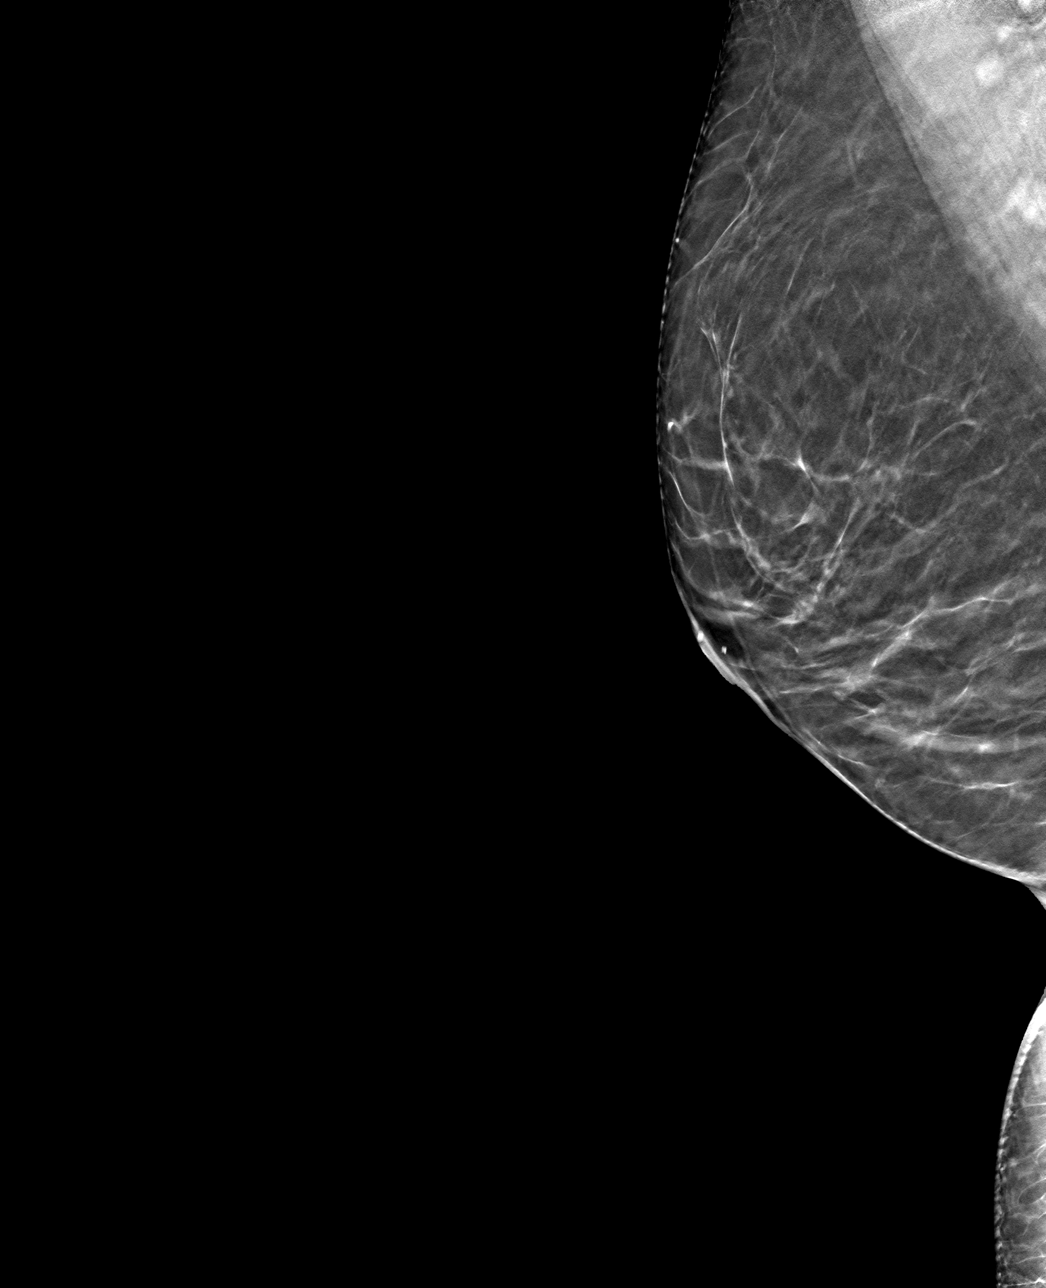

[4 of 12 positions shown; findings below may reference images not displayed]

ACR Breast Density Category b: There are scattered areas of
fibroglandular density.
FINDINGS: Spot compression tomosynthesis and full field mL views of the right
breast were performed for a questioned asymmetry in the slightly
superior right breast. On the additional imaging the tissue in this
area disperses without persistent asymmetry, mass, or distortion.
There are no new findings in the right breast.

Mammographic images were processed with CAD.
IMPRESSION: Resolution of the questioned asymmetry seen on screening mammogram
in the right breast, likely normal overlapping fibroglandular
tissue. No mammographic evidence of malignancy.

RECOMMENDATION:
Screening mammogram in one year.(Code:[id number])

I have discussed the findings and recommendations with the patient.
If applicable, a reminder letter will be sent to the patient
regarding the next appointment.

BI-RADS CATEGORY  1: Negative.

## 2020-12-30 NOTE — Progress Notes (Signed)
Subjective:    Patient ID: Debbie Hogan, female    DOB: 16-Dec-1950, 70 y.o.   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 Years ago, presented with lung nodules of retained mucus- Suspected but never proved ABPA. January 28, 2010 after a pneumonia 2 months prior. EOS were 11.6%, down from 28 %.  EOS 05/01/16- 1.4K (H) FENO 03/18/17- 72 (H) Office Spirometry 03/18/17-WNL-FVC 3.21/96%, FEV1 2.34/91%, ratio 0.73, FEF 25-75% 1.81/82%.  -----------------------------------------------------------------------------   10/02/2019-  Virtual Visit via Telephone Note  History of Present Illness: 70 year old female former smoker followed for asthma, history DVT, HBP, history ? ABPA with abnormal CXR, nasal polyps. Originally presented with lung nodules of retained mucus-never proved ABPA, resected nasal polyps Albuterol hfa, flonase, Advair 100 Last used rescue 2 months ago. Had flu vax. Feels well controlled. Very Covid careful and discussed vaccine. Lab 06/29/2019- Eos 10.2% H relative,  absolute 0.7 wnl              Discussed Had flu vax Observations/Objective:  Assessment and Plan: Asthma mild persistent- Well controlled. Refill meds. Eosinophilia- mild and often just elevated relative to WBC. Not a concern, but watch.  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 Originally presented with lung nodules of retained mucus-never proved ABPA, resected nasal polyps, +Lumbar Spondylolisthesis/ DDD,  -Albuterol hfa, flonase, Advair 100  Covid vax-3 Phizer Flu vax-had ACT score- 24 Now living in [place], [place] near family. Has not yet established medical care there. Feels very well. Using Advair 100 once daily, missing some days. Infrequent need for rescue inhaler and no recent exacerbation.  Significant low back pain last year  much better after injections.  ROS-see HPI    += positive Constitutional:   No-   weight loss, night sweats, fevers, chills, fatigue, lassitude. HEENT:   + headaches, difficulty swallowing, tooth/dental problems, sore throat,       No-  sneezing, itching, ear ache, nasal congestion, post nasal drip,  CV:  No-   chest pain, orthopnea, PND, swelling in lower extremities, anasarca, dizziness,  palpitations Resp: No-   shortness of breath with exertion or at rest.                productive cough,  no- non-productive cough,  No- coughing up of blood.         No- change in color of mucus.  No- wheezing.   Skin: No-   rash or lesions. GI:  No-   heartburn, indigestion, abdominal pain, nausea, vomiting,  GU:  MS:  No-   joint pain or swelling.   Neuro-     nothing unusual Psych:  No- change in mood or affect. No depression or anxiety.  No memory loss.  OBJ- Physical Exam General- Alert, Oriented, Affect-appropriate, Distress- none acute, slender Skin- rash-none, lesions- none, excoriation- none Lymphadenopathy- none Head- atraumatic            Eyes- Gross vision intact, PERRLA, conjunctivae and secretions clear            Ears- +hearing intact            Nose-, no-Septal dev, mucus, erosion, perforation             Throat- Mallampati II , mucosa clear , drainage- none, tonsils- atrophic Neck- flexible , trachea midline, no stridor , thyroid  nl, carotid no bruit Chest - symmetrical excursion , unlabored           Heart/CV- RRR , no murmur , no gallop  , no rub, nl s1 s2                           - JVD- none , edema- none, stasis changes- none, varices- none           Lung-clear/unlabored, wheeze- none, cough- none , dullness-none, rub- none           Chest wall-  Abd-  Br/ Gen/ Rectal- Not done, not indicated Extrem- cyanosis- none, clubbing, none, atrophy- none, strength- nl Neuro- grossly intact to observation

## 2020-12-31 ENCOUNTER — Encounter: Payer: Self-pay | Admitting: Internal Medicine

## 2020-12-31 ENCOUNTER — Other Ambulatory Visit: Payer: Self-pay

## 2020-12-31 ENCOUNTER — Ambulatory Visit (INDEPENDENT_AMBULATORY_CARE_PROVIDER_SITE_OTHER): Payer: Medicare Other | Admitting: Internal Medicine

## 2020-12-31 DIAGNOSIS — J4530 Mild persistent asthma, uncomplicated: Secondary | ICD-10-CM

## 2020-12-31 DIAGNOSIS — B4481 Allergic bronchopulmonary aspergillosis: Secondary | ICD-10-CM

## 2020-12-31 MED ORDER — ALBUTEROL SULFATE HFA 108 (90 BASE) MCG/ACT IN AERS: INHALATION_SPRAY | RESPIRATORY_TRACT | 4 refills | Status: AC

## 2020-12-31 MED ORDER — FLUTICASONE-SALMETEROL 100-50 MCG/DOSE IN AEPB
1.0000 | INHALATION_SPRAY | Freq: Two times a day (BID) | RESPIRATORY_TRACT | 3 refills | Status: AC | PRN
Start: 2020-12-31 — End: ?

## 2020-12-31 MED ORDER — FLUTICASONE PROPIONATE 50 MCG/ACT NA SUSP: 2.0000 | Freq: Every day | NASAL | 3 refills | Status: AC

## 2020-12-31 NOTE — Assessment & Plan Note (Signed)
No recurrence. 

## 2020-12-31 NOTE — Patient Instructions (Signed)
Refills sent for Advair, Proair, Flonase  Please call if we can help and enjoy your life in [place]!

## 2020-12-31 NOTE — Assessment & Plan Note (Signed)
Mild persistent uncomplicated Plan- meds refilled. Ok to try skipping Advair for intervals if feeling well, to see how she does.

## 2021-01-11 ENCOUNTER — Other Ambulatory Visit: Payer: Self-pay | Admitting: Internal Medicine

## 2021-01-11 DIAGNOSIS — I10 Essential (primary) hypertension: Secondary | ICD-10-CM

## 2021-01-11 DIAGNOSIS — F320 Major depressive disorder, single episode, mild: Secondary | ICD-10-CM

## 2021-02-25 ENCOUNTER — Telehealth: Payer: Self-pay | Admitting: Internal Medicine

## 2021-02-25 MED ORDER — PAXLOVID 20 X 150 MG & 10 X 100MG PO TBPK: 1.0000 | ORAL_TABLET | Freq: Two times a day (BID) | ORAL | 0 refills | Status: AC

## 2021-02-25 MED ORDER — PAXLOVID 20 X 150 MG & 10 X 100MG PO TBPK: 1.0000 | ORAL_TABLET | Freq: Two times a day (BID) | ORAL | 0 refills | Status: DC

## 2021-02-25 NOTE — Telephone Encounter (Signed)
I called Debbie Hogan who is currently in [place], [place]. She has nasal congestion and tired, but no fever and not feeling seriously ill.  We discussed Paxlovid and I will try ordering it through her pharmacy there.

## 2021-02-25 NOTE — Telephone Encounter (Signed)
Dr. Annamaria Boots, This message was received this morning.  Good morning - I returned from [place] on Sunday.  I tested negative on Saturday night.  I had coldlike symptoms for several days and decided to test last night and was positive.  Should I start the antiviral meds?  I am in [place], [place].  Drug store is [place], [place], [phone].  I do not have a fever, O2 level is 99%, right ear is stopped up.  Thank you.  Allergies  Allergen Reactions  . Atorvastatin Other (See Comments)    Muscle aches  . Kiwi Extract Other (See Comments)    Other Reaction: Other reaction  . Montelukast Sodium   . Other Other (See Comments)    Uncoded Allergy. Allergen: CANTALOPE, Other Reaction: GI Upset Uncoded Allergy. Allergen: bananas, Other Reaction: GI Upset Uncoded Allergy. Allergen: honey dew, Other Reaction: GI Upset  . Montelukast Sodium Palpitations    Irregular heart beat   Current Outpatient Medications on File Prior to Visit  Medication Sig Dispense Refill  . albuterol (PROAIR HFA) 108 (90 Base) MCG/ACT inhaler USE 2 INHALATIONS INTO THE LUNGS  EVERY 6 HOURS AS NEEDED FOR WHEEZING OR SHORTNESS OF BREATH 54 g 4  . buPROPion (WELLBUTRIN XL) 300 MG 24 hr tablet TAKE 1 TABLET DAILY 90 tablet 1  . calcium citrate-vitamin D (CITRACAL+D) 315-200 MG-UNIT per tablet Take 1 tablet by mouth every other day.     . fluticasone (FLONASE) 50 MCG/ACT nasal spray Place 2 sprays into both nostrils daily. 48 g 3  . Fluticasone-Salmeterol (ADVAIR DISKUS) 100-50 MCG/DOSE AEPB Inhale 1 puff into the lungs 2 (two) times daily as needed. 3 each 3  . guaiFENesin (MUCINEX) 600 MG 12 hr tablet Take 600 mg by mouth daily.    Marland Kitchen LORazepam (ATIVAN) 0.5 MG tablet TAKE 1 TABLET(0.5 MG) BY MOUTH EVERY 8 HOURS AS NEEDED FOR ANXIETY 90 tablet 5  . losartan (COZAAR) 100 MG tablet TAKE 1 TABLET DAILY 90 tablet 1  . Pitavastatin Calcium (LIVALO) 2 MG TABS Take 1 tablet (2 mg total) by mouth daily. 90 tablet 1   No current  facility-administered medications on file prior to visit.   Message routed to Dr. Annamaria Boots to advise

## 2021-02-25 NOTE — Telephone Encounter (Signed)
Called and spoke with Patient. Patient stated [facility] did not have Paxlovid.  Patient stated she contacted [facility] [address] [place], [place] and they do have Paxlovid.  Patient requested [facility] sent to [facility].  [facility] sent to requested [facility]. Nothing further at this time.

## 2021-05-22 ENCOUNTER — Other Ambulatory Visit: Payer: Self-pay | Admitting: Internal Medicine

## 2021-05-22 DIAGNOSIS — E785 Hyperlipidemia, unspecified: Secondary | ICD-10-CM

## 2021-05-29 ENCOUNTER — Encounter: Payer: Self-pay | Admitting: Internal Medicine

## 2021-06-09 ENCOUNTER — Emergency Department: Payer: Medicare Other

## 2021-06-09 ENCOUNTER — Emergency Department
Admission: EM | Admit: 2021-06-09 | Discharge: 2021-06-09 | Disposition: A | Discharge status: Home / Self Care | Payer: Medicare Other | Attending: Emergency Medicine | Admitting: Emergency Medicine

## 2021-06-09 DIAGNOSIS — J40 Bronchitis, not specified as acute or chronic: Secondary | ICD-10-CM | POA: Insufficient documentation

## 2021-06-09 HISTORY — DX: Essential (primary) hypertension: I10

## 2021-06-09 HISTORY — DX: Allergic bronchopulmonary aspergillosis: B44.81

## 2021-06-09 HISTORY — DX: Developmental ovarian cyst: Q50.1

## 2021-06-09 HISTORY — DX: Anxiety disorder, unspecified: F41.9

## 2021-06-09 LAB — COVID-19 (SARS-COV-2) & INFLUENZA  A/B, NAA (ROCHE LIAT)
Influenza A: NOT DETECTED
Influenza B: NOT DETECTED
SARS CoV 2 Overall Result: NOT DETECTED

## 2021-06-09 LAB — COMPREHENSIVE METABOLIC PANEL
ALT: 19 U/L (ref 0–55)
AST (SGOT): 27 U/L (ref 5–34)
Albumin/Globulin Ratio: 1.1 (ref 0.9–2.2)
Albumin: 3.9 g/dL (ref 3.5–5.0)
Alkaline Phosphatase: 58 U/L (ref 37–117)
Anion Gap: 7 (ref 5.0–15.0)
BUN: 16 mg/dL (ref 7.0–19.0)
Bilirubin, Total: 0.4 mg/dL (ref 0.2–1.2)
CO2: 27 mEq/L (ref 22–29)
Calcium: 10 mg/dL (ref 8.5–10.5)
Chloride: 104 mEq/L (ref 100–111)
Creatinine: 0.7 mg/dL (ref 0.6–1.0)
Globulin: 3.5 g/dL (ref 2.0–3.6)
Glucose: 124 mg/dL — ABNORMAL HIGH (ref 70–100)
Potassium: 4.4 mEq/L (ref 3.5–5.1)
Protein, Total: 7.4 g/dL (ref 6.0–8.3)
Sodium: 138 mEq/L (ref 136–145)

## 2021-06-09 LAB — ECG 12-LEAD
Atrial Rate: 96 {beats}/min
P Axis: 69 degrees
P-R Interval: 210 ms
Q-T Interval: 338 ms
QRS Duration: 80 ms
QTC Calculation (Bezet): 427 ms
R Axis: 10 degrees
T Axis: 61 degrees
Ventricular Rate: 96 {beats}/min

## 2021-06-09 LAB — GFR: EGFR: >60

## 2021-06-09 LAB — CBC AND DIFFERENTIAL
Absolute NRBC: 0 10*3/uL (ref 0.00–0.00)
Basophils Absolute Automated: 0.15 10*3/uL — ABNORMAL HIGH (ref 0.00–0.08)
Basophils Automated: 1.5 %
Eosinophils Absolute Automated: 0.72 10*3/uL — ABNORMAL HIGH (ref 0.00–0.44)
Eosinophils Automated: 7.1 %
Hematocrit: 46.1 % — ABNORMAL HIGH (ref 34.7–43.7)
Hgb: 15.2 g/dL — ABNORMAL HIGH (ref 11.4–14.8)
Immature Granulocytes Absolute: 0.02 10*3/uL (ref 0.00–0.07)
Immature Granulocytes: 0.2 %
Lymphocytes Absolute Automated: 1.84 10*3/uL (ref 0.42–3.22)
Lymphocytes Automated: 18.2 %
MCH: 30.3 pg (ref 25.1–33.5)
MCHC: 33 g/dL (ref 31.5–35.8)
MCV: 91.8 fL (ref 78.0–96.0)
MPV: 9.3 fL (ref 8.9–12.5)
Monocytes Absolute Automated: 0.67 10*3/uL (ref 0.21–0.85)
Monocytes: 6.6 %
Neutrophils Absolute: 6.72 10*3/uL — ABNORMAL HIGH (ref 1.10–6.33)
Neutrophils: 66.4 %
Nucleated RBC: 0 /100 WBC (ref 0.0–0.0)
Platelets: 317 10*3/uL (ref 142–346)
RBC: 5.02 10*6/uL (ref 3.90–5.10)
RDW: 14 % (ref 11–15)
WBC: 10.12 10*3/uL — ABNORMAL HIGH (ref 3.10–9.50)

## 2021-06-09 LAB — IHS D-DIMER: D-Dimer: 0.34 ug/mL FEU (ref 0.00–0.60)

## 2021-06-09 LAB — B-TYPE NATRIURETIC PEPTIDE: B-Natriuretic Peptide: 30 pg/mL (ref 0–100)

## 2021-06-09 LAB — TROPONIN I: Troponin I: <0.01 ng/mL (ref 0.00–0.05)

## 2021-06-09 MED ORDER — ALBUTEROL SULFATE HFA 108 (90 BASE) MCG/ACT IN AERS
2.0000 | INHALATION_SPRAY | Freq: Four times a day (QID) | RESPIRATORY_TRACT | 0 refills | Status: AC | PRN
Start: 2021-06-09 — End: 2021-07-09

## 2021-06-09 MED ORDER — IPRATROPIUM BROMIDE 0.02 % IN SOLN
0.5000 mg | Freq: Once | RESPIRATORY_TRACT | Status: AC
Start: 2021-06-09 — End: 2021-06-09
  Administered 2021-06-09: 08:00:00 0.5 mg via RESPIRATORY_TRACT
  Filled 2021-06-09: qty 2.5

## 2021-06-09 MED ORDER — LEVOFLOXACIN IN D5W 750 MG/150ML IV SOLN
750.0000 mg | Freq: Once | INTRAVENOUS | Status: AC
Start: 2021-06-09 — End: 2021-06-09
  Administered 2021-06-09: 10:00:00 750 mg via INTRAVENOUS
  Filled 2021-06-09: qty 150

## 2021-06-09 MED ORDER — METHYLPREDNISOLONE SODIUM SUCC 125 MG IJ SOLR
125.0000 mg | Freq: Once | INTRAMUSCULAR | Status: AC
Start: 2021-06-09 — End: 2021-06-09
  Administered 2021-06-09: 08:00:00 125 mg via INTRAVENOUS
  Filled 2021-06-09: qty 2

## 2021-06-09 MED ORDER — ALBUTEROL-IPRATROPIUM 2.5-0.5 (3) MG/3ML IN SOLN
3.0000 mL | Freq: Once | RESPIRATORY_TRACT | Status: DC
Start: 2021-06-09 — End: 2021-06-09

## 2021-06-09 MED ORDER — SODIUM CHLORIDE 0.9 % IV BOLUS
1000.0000 mL | Freq: Once | INTRAVENOUS | Status: AC
Start: 2021-06-09 — End: 2021-06-09
  Administered 2021-06-09: 09:00:00 1000 mL via INTRAVENOUS

## 2021-06-09 MED ORDER — ALBUTEROL SULFATE (2.5 MG/3ML) 0.083% IN NEBU
5.0000 mg | INHALATION_SOLUTION | Freq: Once | RESPIRATORY_TRACT | Status: AC
Start: 2021-06-09 — End: 2021-06-09
  Administered 2021-06-09: 08:00:00 5 mg via RESPIRATORY_TRACT
  Filled 2021-06-09: qty 6

## 2021-06-09 MED ORDER — LEVOFLOXACIN 750 MG PO TABS
750.0000 mg | ORAL_TABLET | Freq: Every day | ORAL | 0 refills | Status: AC
Start: 2021-06-09 — End: 2021-06-14

## 2021-06-09 MED ORDER — METHYLPREDNISOLONE 4 MG PO TBPK
ORAL_TABLET | ORAL | 0 refills | Status: AC
Start: 2021-06-09 — End: ?

## 2021-06-09 NOTE — ED Notes (Signed)
Pt ambulatory with steady gait. O2 sats remain above 94%, no complaint of SOB

## 2021-06-09 NOTE — ED Triage Notes (Signed)
Pt to ED c/o worsened SOB and wheezing since last night. Reports dx with covid in May and has had lingering chest congestion, SOB upon exertion, and cough. Reports seen by PCP one month ago and given zpack and steroid and improved. Reports today she feels she is breathing fast and SOB even at rest. Reports she drank red wine on Saturday and has had "allergic reaction" to red wine in the past that she describes as coughing and is concerned that may be related. Patient A&Ox4, respirations regular and tachypneic. Color pink. PMH allergic bronchopulmonary aspergillosis, HTN.

## 2021-06-09 NOTE — ED Notes (Signed)
I have assisted in the ordering of diagnostic studies necessary to expedite care. I am not the primary provider for this patient. Please refer to the provider/reassessment/disposition notes for further information about this patients emergency department course.      covid in may  Now with cough, shortness of breath, wheezing    Now with wheezing     Alyson Reedy, MD  06/09/21 [phone]

## 2021-06-09 NOTE — ED Provider Notes (Signed)
[facility] EMERGENCY DEPARTMENT H&P         CLINICAL INFORMATION        HPI:      Chief Complaint: Shortness of Breath and Cough  .    Patty West is a 70 y.o. female with PMHx of asthma and allergic bronchopulmonary aspergillosis who presents with moderate persistent coughing and wheezing since last night.  Patient says she had COVID on May 1 and since then has had a productive cough and congestion.  Had a 5-day course of antibiotics and steroids 4 weeks ago with improvement.  Has been using her inhaler more often in the past 12 hours, last just prior to arrival.  Denies fever, chills, body aches, nausea, vomiting, abdominal pain, and chest pressure.  Taking Advair and fluticasone.    History obtained from: Patient          ROS:      Positive and negative ROS elements as per HPI.  All other systems reviewed and negative.      Physical Exam:      Pulse (!) 119  BP (!) 191/93  Resp 22  SpO2 93 %  Temp 97.3 F (36.3 C)    Physical Exam  Constitutional:       General: She is not in acute distress.     Appearance: She is well-developed. She is not diaphoretic.   HENT:      Head: Normocephalic and atraumatic.   Eyes:      Conjunctiva/sclera: Conjunctivae normal.      Pupils: Pupils are equal, round, and reactive to light.   Cardiovascular:      Rate and Rhythm: Normal rate and regular rhythm.      Heart sounds: Normal heart sounds.   Pulmonary:      Effort: Pulmonary effort is normal. Tachypnea present. No respiratory distress.      Breath sounds: Wheezing present.      Comments: Expiratory wheezing bilaterally.  Abdominal:      General: Bowel sounds are normal.      Palpations: Abdomen is soft.      Tenderness: There is no abdominal tenderness.   Musculoskeletal:         General: No tenderness. Normal range of motion.   Skin:     General: Skin is warm and dry.   Neurological:      Mental Status: She is alert and oriented to person, place, and time.      Cranial Nerves: No cranial  nerve deficit.   Psychiatric:         Behavior: Behavior normal.         Thought Content: Thought content normal.         Judgment: Judgment normal.                PAST HISTORY        Primary Care Provider: Patsy Lager, MD        PMH/PSH:    .     Past Medical History:   Diagnosis Date    Allergic bronchopulmonary aspergillosis     Anxiety     Hypertension     Multiple developmental ovarian cysts        She has a past surgical history that includes Salpingoophorectomy.      Social/Family History:      She reports that she has never smoked. She has never used smokeless tobacco. She reports current alcohol use. She reports that she does not use drugs.  No family history on file.      Listed Medications on Arrival:    .     Home Medications       Med List Status: In Progress Set By: Desma Paganini, RN at 06/09/2021  8:20 AM              albuterol sulfate HFA (PROVENTIL) 108 (90 Base) MCG/ACT inhaler     Inhale 2 puffs into the lungs     buPROPion SR (Wellbutrin SR) 150 MG 12 hr tablet     Take 150 mg by mouth 2 (two) times daily     DULoxetine (CYMBALTA) 30 MG capsule     Take 30 mg by mouth daily     fluticasone (FLONASE) 50 MCG/ACT nasal spray     1 spray by Nasal route daily     fluticasone-salmeterol (ADVAIR DISKUS) 100-50 MCG/ACT Aerosol Pwdr, Breath Activated     Inhale 1 puff into the lungs     guaiFENesin (MUCINEX) 600 MG 12 hr tablet     Take 1,200 mg by mouth 2 (two) times daily     losartan (COZAAR) 100 MG tablet     Take 100 mg by mouth daily     pitavastatin calcium (Livalo) 2 MG tablet     Take 2 mg by mouth nightly           Allergies: She is allergic to singulair [montelukast].           Visit date: 06/09/2021      CLINICAL SUMMARY          Diagnosis:    .     Final diagnoses:   Bronchitis         MDM Notes:        DDx-              Allergic bronchopulmonary aspergillosis, reactive airway disease, pneumonia, PE, CHF, anemia.      Patient is a 70 year old female presents with coughing.  No hypoxia  at rest or with ambulation.  No increased work of breathing or respiratory distress.  Chest x-ray with no lobar pneumonia.  Given wheezing will provide albuterol nebulizer, IV steroids, IV antibiotics for possible atypical pneumonia.  D-dimer negative, no concern for PE.  BNP within normal limits no concern for decompensated CHF.  Troponin within normal limits.  No acute metabolic derangement.  No signs of sepsis.  Will discharge with steroid Dosepak, antibiotic, albuterol inhaler.    All laboratory and imaging results discussed in detail. All potential differential diagnoses discussed, rationale for treatment plan and medical decision making discussed. Home care instructions advised in detail. Return precautions thoroughly explained. Close follow up with PCP and appropriate specialist/consultants advised and arranged if needed. Joint discharge with RN done, all questions answered and pt notes understanding to plan of care.                VISIT INFORMATION        Clinical Course in the ED:                 Medications Given in the ED:    .     ED Medication Orders (From admission, onward)      Start Ordered     Status Ordering Provider    06/09/21 [phone] 06/09/21 0904  levoFLOXacin (LEVAQUIN) 750mg  in D5W IVPB (premix)  Once        Route: Intravenous  Ordered Dose: 750 mg  Last MAR action: Stopped Marlene Beidler A    06/09/21 0801 06/09/21 0800  sodium chloride 0.9 % bolus 1,000 mL  Once        Route: Intravenous  Ordered Dose: 1,000 mL     Last MAR action: Stopped Lyfe Monger A    06/09/21 0801 06/09/21 0800  albuterol (PROVENTIL) (2.5 MG/3ML) 0.083% nebulizer solution 5 mg  RT - Once        Route: Nebulization  Ordered Dose: 5 mg     Last MAR action: Given Emannuel Vise A    06/09/21 0801 06/09/21 0800  ipratropium (ATROVENT) 0.02 % nebulizer solution 0.5 mg  RT - Once        Route: Nebulization  Ordered Dose: 0.5 mg     Last MAR action: Given Kala Ambriz A    06/09/21 0744 06/09/21 0743    RT - Once        Route:  Nebulization  Ordered Dose: 3 mL     Discontinued GETACHEW, BAHRENEGASH    06/09/21 0744 06/09/21 0743  methylPREDNISolone sodium succinate (Solu-MEDROL) injection 125 mg  Once        Route: Intravenous  Ordered Dose: 125 mg     Last MAR action: Given GETACHEW, BAHRENEGASH              Procedures:      Procedures      Interpretations:        O2 sat-           saturation: 93 %; Oxygen use: room air; Interpretation: borderline normal    Radiology -     interpreted by me with the following observations: Bilateral infiltrates.  EKG -             interpreted by me: normal sinus at 96.  Delayed QRS interval.  Artifact at baseline.  Monitor -         interpreted by me: sinus tachycardia at 100.                   RESULTS        Lab Results:      Results       Procedure Component Value Units Date/Time    Troponin I [[id number]] Collected: 06/09/21 0749    Specimen: Blood Updated: 06/09/21 1103     Troponin I <0.01 ng/mL     B-type Natriuretic Peptide [[id number]] Collected: 06/09/21 0749    Specimen: Blood Updated: 06/09/21 0949     B-Natriuretic Peptide 30 pg/mL     D-Dimer [[id number]] Collected: 06/09/21 0827     Updated: 06/09/21 0902     D-Dimer 0.34 ug/mL FEU     COVID-19 (SARS-CoV-2) and Influenza A/B, NAA (Liat Rapid)- Age 60 and above [[id number]] Collected: 06/09/21 0827    Specimen: Culturette from Nasopharyngeal Updated: 06/09/21 0901     Purpose of COVID testing Diagnostic -[facility]     SARS-CoV-2 Specimen Source Nasal Swab     SARS CoV 2 Overall Result Not Detected     Influenza A Not Detected     Influenza B Not Detected    Narrative:      o Collect and clearly label specimen type:  o PREFERRED-Upper respiratory specimen: One Nasal Swab in  Transport Media.  o Hand deliver to laboratory ASAP  Diagnostic -[facility]    Comprehensive metabolic panel [[id number]]  (Abnormal) Collected: 06/09/21 0749    Specimen: Blood Updated: 06/09/21 0825     Glucose 124 mg/dL  BUN 16.0 mg/dL      Creatinine 0.7 mg/dL      Sodium 540 mEq/L       Potassium 4.4 mEq/L      Chloride 104 mEq/L      CO2 27 mEq/L      Calcium 10.0 mg/dL      Protein, Total 7.4 g/dL      Albumin 3.9 g/dL      AST (SGOT) 27 U/L      ALT 19 U/L      Alkaline Phosphatase 58 U/L      Bilirubin, Total 0.4 mg/dL      Globulin 3.5 g/dL      Albumin/Globulin Ratio 1.1     Anion Gap 7.0    GFR [[id number]] Collected: 06/09/21 0749     Updated: 06/09/21 0825     EGFR >60.0       CBC and differential [[id number]]  (Abnormal) Collected: 06/09/21 0749    Specimen: Blood Updated: 06/09/21 0803     WBC 10.12 x10 3/uL      Hgb 15.2 g/dL      Hematocrit 65.7 %      Platelets 317 x10 3/uL      RBC 5.02 x10 6/uL      MCV 91.8 fL      MCH 30.3 pg      MCHC 33.0 g/dL      RDW 14 %      MPV 9.3 fL      Neutrophils 66.4 %      Lymphocytes Automated 18.2 %      Monocytes 6.6 %      Eosinophils Automated 7.1 %      Basophils Automated 1.5 %      Immature Granulocytes 0.2 %      Nucleated RBC 0.0 /100 WBC      Neutrophils Absolute 6.72 x10 3/uL      Lymphocytes Absolute Automated 1.84 x10 3/uL      Monocytes Absolute Automated 0.67 x10 3/uL      Eosinophils Absolute Automated 0.72 x10 3/uL      Basophils Absolute Automated 0.15 x10 3/uL      Immature Granulocytes Absolute 0.02 x10 3/uL      Absolute NRBC 0.00 x10 3/uL                 Radiology Results:          Chest 2 Views   Final Result         1. Bilateral perihilar infiltrates consistent with pneumonia including   Covid   2. Associated peribronchial thickening may reflect superimposed reactive   airway disease or bronchitis      Marty Heck, MD    06/09/2021 8:11 AM              Disposition:        Discharge         Discharge Prescriptions       Medication Sig Dispense Auth. Provider    albuterol sulfate HFA (PROVENTIL) 108 (90 Base) MCG/ACT inhaler Inhale 2 puffs into the lungs every 6 (six) hours as needed for Wheezing or Shortness of Breath Dispense with spacer 18 g Hayden Rasmussen, MD    levoFLOXacin (LEVAQUIN) 750 MG tablet Take 1 tablet (750 mg  total) by mouth daily for 5 days 5 tablet Hayden Rasmussen, MD    methylPREDNISolone (MEDROL DOSEPAK) 4 MG tablet Use as directed 21 tablet Hayden Rasmussen, MD  Scribe Attestation:      I was acting as a Neurosurgeon for No att. providers found on Supple,Jordanne  Llana Aliment    I am the first provider for this patient and I personally performed the services documented. Llana Aliment is scribing for me on [place],Nyjah. This note accurately reflects work and decisions made by me.  No att. providers found              Hayden Rasmussen, MD  06/27/21 [phone]

## 2021-06-09 NOTE — Discharge Instructions (Addendum)
Dear Ms. Patty West:    Thank you for choosing the [facility] Emergency Department, the premier emergency department in the [place] area.  I hope your visit today was EXCELLENT. You will receive a survey via text message that will give you the opportunity to provide feedback to your team about your visit. Please do not hesitate to reach out with any questions!    Specific instructions for your visit today:    Please follow up with [facility] and your pulmonologist.    IF YOU DO NOT CONTINUE TO IMPROVE OR YOUR CONDITION WORSENS, PLEASE CONTACT YOUR DOCTOR OR RETURN IMMEDIATELY TO THE EMERGENCY DEPARTMENT.    Sincerely,  Hayden Rasmussen, MD  Attending Emergency Physician  [facility] Emergency Department      OBTAINING A PRIMARY CARE APPOINTMENT    Primary care physicians (PCPs, also known as primary care doctors) are either internists or family medicine doctors. Both types of PCPs focus on health promotion, disease prevention, patient education and counseling, and treatment of acute and chronic medical conditions.    If you need a primary care doctor, please call the below number and ask who is receiving new patients.     [facility]  Telephone:  [phone]  [email or web address]    DOCTOR REFERRALS  Call [phone] (available 24 hours a day, 7 days a week) if you need any further referrals and we can help you find a primary care doctor or specialist.  Also, available online at:  [email or web address]    YOUR CONTACT INFORMATION  Before leaving please check with registration to make sure we have an up-to-date contact number.  You can call registration at [phone] to update your information.  For questions about your hospital bill, please call [phone].  For questions about your Emergency Dept Physician bill please call [phone].      FREE HEALTH SERVICES  If you need help with health or social services, please call 2-1-1 for a free referral to resources in your  area.  2-1-1 is a free service connecting people with information on health insurance, free clinics, pregnancy, mental health, dental care, food assistance, housing, and substance abuse counseling.  Also, available online at:  http://www.[place].org    ORTHOPEDIC INJURY   Please know that significant injuries can exist even when an initial x-ray is read as normal or negative.  This can occur because some fractures (broken bones) are not initially visible on x-rays.  For this reason, close outpatient follow-up with your primary care doctor or bone specialist (orthopedist) is required.    MEDICATIONS AND FOLLOWUP  Please be aware that some prescription medications can cause drowsiness.  Use caution when driving or operating machinery.    The examination and treatment you have received in our Emergency Department is provided on an emergency basis, and is not intended to be a substitute for your primary care physician.  It is important that your doctor checks you again and that you report any new or remaining problems at that time.      ASSISTANCE WITH INSURANCE    Affordable Care Act  [facility])  Call to start or finish an application, compare plans, enroll or ask a question.  [phone]  TTY: ([phone]  Web:  Healthcare.gov    Help Enrolling in [facility]  [employer] [place]  905-039-9079 (TOLL-FREE)  (801)219-2240 (TTY)  Web:  Http://www.coverva.org    Local Help Enrolling in the [facility]  [place] [facility]  [phone] (MAIN)  Email:  health-help_0 .org  Web:  http://lewis-perez.info/  Address:  [address] [place], [place] [address]    SEDATING MEDICATIONS  Sedating medications include strong pain medications (e.g. narcotics), muscle relaxers, benzodiazepines (used for anxiety and as muscle relaxers), Benadryl/diphenhydramine and other antihistamines for allergic reactions/itching, and other medications.  If you are unsure if you have received a sedating medication, please ask your  physician or nurse.  If you received a sedating medication: DO NOT drive a car. DO NOT operate machinery. DO NOT perform jobs where you need to be alert.  DO NOT drink alcoholic beverages while taking this medicine.     If you get dizzy, sit or lie down at the first signs. Be careful going up and down stairs.  Be extra careful to prevent falls.     Never give this medicine to others.     Keep this medicine out of reach of children.     Do not take or save old medicines. Throw them away when outdated.     Keep all medicines in a cool, dry place. DO NOT keep them in your bathroom medicine cabinet or in a cabinet above the stove.    MEDICATION REFILLS  Please be aware that we cannot refill any prescriptions through the ER. If you need further treatment from what is provided at your ER visit, please follow up with your primary care doctor or your pain management specialist.    Tees Toh  Did you know [facility] has two freestanding ERs located just a few miles away?  [facility] [place] [facility] [place] have short wait times, easy free parking directly in front of the building and top patient satisfaction scores - and the same Board Certified Emergency Medicine doctors as [facility].

## 2021-06-12 ENCOUNTER — Telehealth: Payer: Self-pay | Admitting: *Deleted

## 2021-06-12 NOTE — Telephone Encounter (Signed)
Dear Dr Annamaria Boots, I am now living in [place], [place], and getting established.  I had Covid at the beginning of May 2022 and am still congested and short of breath.  I did a round of doxycycline, later a round of Z-pac and 5 days of predisone, tapered.  I felt like I was improving but the congestion soon returned.  A month later, on Sunday night (8/21) I began wheezing (inhale and exhale) - none of my normal tricks worked.  When my Blood O2 went to 92%, I went to the ER.  EKG, blood work, all looked fine.  Chest Xray says "Associated peribronchial thickening may reflect superimposed reactive airway disease or bronchitis."  I got a breathing treatment, IV antibiotics and prednisone, and 5 day scripts for levofloxacin and methylPREDNISolone.  The wheezing has improved, but I will be surprised if it lasts.  I called an ABPA expert at [facility] to establish a new relationship (I am accustomed to top notch care!), Dr. Modena Slater Diette.  His office wants to see my records before he will accept me as a patient.  The shortness of breath make me feel like I may have a bronchial tube issue again.  Can you please send my records to his office, especially the early ones when you referred me to [facility]?  The fax number is [phone].  Contact is Lum Keas.   Please let me know if I am on the right track or if you need additional information from me to send the records.  Thank you.  I hope you are doing well.  Called and spoke with patient, advised her that she will need to call our Medical Records department to request her records and fill out a release of information form.  I provided her with the phone #:  [phone], [phone].  She verbalized understanding.  Nothing further needed.

## 2021-10-28 ENCOUNTER — Telehealth: Payer: Self-pay

## 2021-10-28 NOTE — Telephone Encounter (Signed)
Returned the patients call from 10/27/21 at 1:59 pm.  Reached out to the patient to review what provider has referred or stated that she needs to follow up with a Hematologist.  The patient states that records should be requested from Dr. Sudie Grumbling [phone]: phone or [phone] : fax) . We reviewed that once records are received I will call the patient back to go over scheduling her new patient hematology appointment.  The patient also stated that Dr.  Laural Benes is requiring a hematology clearance prior to continuing hormone replacement therapy.

## 2021-11-03 NOTE — Telephone Encounter (Signed)
Received the following message from patient:   "Hi Dr. Annamaria Boots, I was able to get all the records I need.  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  I realize that I have an appointment scheduled with you for March 15 at [facility] which I need to cancel.  Thank you again for all the good care you gave me over the years.  I will miss seeing you and enjoying your delightful sense of humor.  Best wishes.   Debbie Hogan"  I have cancelled her appointment for March 2023. Will forward message to Dr. Annamaria Boots.

## 2021-11-07 NOTE — Telephone Encounter (Signed)
Reached out to the patient to review that we have to received records requested from Dr. Sudie Grumbling.  Reviewed with the patient that she would need to reach out to the providers office to request the office notes to be faxed over to my attention.  We reviewed that I will send my contact information to the patient via Mychart .  Once the patients records are received I will call the patient back to get scheduled for her New Patient Appointment.

## 2021-11-13 ENCOUNTER — Telehealth: Payer: Self-pay

## 2021-11-13 NOTE — Telephone Encounter (Signed)
[facility] New Patient Coordinator Note    Physician/Location Preference:    Location Preference: Arty Baumgartner    Physician Preference: First Available    Referral:    Referring Provider: Dr. Sudie Grumbling    Is Referral required per insurance? No      History:    Personal Hx of Cancer: No    Prior Chemotherapy - No  Prior Radiation - No   Prior Surgery related to Cancer - No    Family Hx of Cancer : No    Biopsy History:    No    Imaging History:    Prior Imaging: No      Type of Imaging: N/A  Location Performed: N/A    Other:     Are there patient owned records that will be brought to the first appointment?No    Has the Appointment been scheduled? Yes The patient has been scheduled with Dr. Balinda Quails at [facility] on 12/22/21 at 3 pm .  Reviewed the location of the clinic with the patient and reviewed the current mask policy.    Patient Needs hematology clearance to continue hormone replacement therapy

## 2021-12-22 ENCOUNTER — Ambulatory Visit
Payer: Medicare Other | Attending: Student in an Organized Health Care Education/Training Program | Admitting: Student in an Organized Health Care Education/Training Program

## 2021-12-22 DIAGNOSIS — Z86718 Personal history of other venous thrombosis and embolism: Secondary | ICD-10-CM | POA: Insufficient documentation

## 2021-12-22 DIAGNOSIS — N951 Menopausal and female climacteric states: Secondary | ICD-10-CM | POA: Insufficient documentation
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
## 2021-12-31 ENCOUNTER — Ambulatory Visit: Payer: Medicare Other | Admitting: Internal Medicine

## 2022-11-02 ENCOUNTER — Encounter: Payer: Self-pay | Admitting: Gastroenterology

## 2023-09-24 ENCOUNTER — Encounter (INDEPENDENT_AMBULATORY_CARE_PROVIDER_SITE_OTHER): Payer: Self-pay

## 2023-09-24 ENCOUNTER — Ambulatory Visit (INDEPENDENT_AMBULATORY_CARE_PROVIDER_SITE_OTHER): Payer: Medicare Other | Admitting: Family

## 2023-09-24 VITALS — BP 153/86 | HR 100 | Temp 97.1°F | Resp 17 | Ht 65.0 in | Wt 131.0 lb

## 2023-09-24 DIAGNOSIS — S0181XA Laceration without foreign body of other part of head, initial encounter: Secondary | ICD-10-CM

## 2023-09-24 DIAGNOSIS — S0990XA Unspecified injury of head, initial encounter: Secondary | ICD-10-CM

## 2023-09-24 NOTE — Progress Notes (Signed)
Patient: Patty West   Date: 09/24/2023   MRN: [id number]     Subjective     Chief Complaint   Patient presents with    Head Injury     Hit head on metal light pole, right eye injury/swelling x11pm 09/23/23          Head Injury       Patty West is a 72 y.o. female with with a head injury that she sustained last night around 11 PM.  Patient was walking her dog and her dog lunged at another dog pulling the leash.  Patient hit her right side of the head on the pool.  Patient is wearing her glasses and the glasses broke.  Patient sustained a laceration to the right forehead and there is also some swelling and bruising.  She also feels some pain to the right side of her chest.  She denies any shortness of breath.  The pain to her right thumb with the movement.  Patient has some mild tenderness to her right forehead.  She denies any severe headaches, double vision, blurry vision, dizziness, nausea, vomiting, numbness, tingling or weakness.  Denies pain with eye movement.  No OTC tx.   History obtained from: patient.      History:  Pertinent Past Medical, Surgical, Family and Social History were reviewed.  Current Medications[1]  Allergies[2]  Medications and Allergies reviewed.         Objective   Vitals:    09/24/23 0848   BP: 153/86   BP Site: Left arm   Patient Position: Sitting   Cuff Size: Medium   Pulse: 100   Resp: 17   Temp: 97.1 F (36.2 C)   TempSrc: Tympanic   SpO2: 98%   Weight: 59.4 kg (131 lb)   Height: 1.651 m (5\' 5" )     Body mass index is 21.8 kg/m.    Physical Exam  Constitutional:       General: She is not in acute distress.     Appearance: Normal appearance. She is well-developed. She is not ill-appearing, toxic-appearing or diaphoretic.   HENT:      Head: Laceration present.      Jaw: There is normal jaw occlusion. No trismus or tenderness.        Comments: There is swelling and contusion noted to right periorbital region.  There is 1.5 cm laceration right above the right eyebrow.  Dried blood  noted around the area.  There is also mild swelling to right upper eyebrow.  Patient's EOMs intact.  PERRLA.      Right Ear: No drainage. No mastoid tenderness. Tympanic membrane is not erythematous or bulging.      Left Ear: No drainage. No mastoid tenderness. Tympanic membrane is not erythematous or bulging.      Nose: Nose normal.     Eyes: Conjunctivae, EOM and lids are normal. Pupils are equal, round, and reactive to light. No visual field deficit is present. Neck:      Trachea: Trachea normal.   Cardiovascular:      Rate and Rhythm: Normal rate and regular rhythm.      Heart sounds: Normal heart sounds, S1 normal and S2 normal. No murmur heard.     No gallop.   Pulmonary:      Effort: Pulmonary effort is normal. No accessory muscle usage or respiratory distress.      Breath sounds: Normal breath sounds. No wheezing or rales.   Abdominal:  General: There is no distension.   Musculoskeletal:         General: Normal range of motion.      Cervical back: Normal range of motion and neck supple. No signs of trauma, rigidity or tenderness. No pain with movement, spinous process tenderness or muscular tenderness. Normal range of motion.   Neurological:      Mental Status: She is alert and oriented to person, place, and time.      GCS: GCS eye subscore is 4. GCS verbal subscore is 5. GCS motor subscore is 6.      Cranial Nerves: No cranial nerve deficit, dysarthria or facial asymmetry.      Sensory: Sensation is intact. No sensory deficit.      Motor: Motor function is intact. No weakness.      Coordination: Coordination is intact. Romberg sign negative.      Gait: Gait is intact. Gait normal.      Deep Tendon Reflexes: Reflexes are normal and symmetric.      Reflex Scores:       Patellar reflexes are 2+ on the right side and 2+ on the left side.  Skin:     General: Skin is warm and dry.      Findings: No rash.   Psychiatric:         Speech: Speech normal.         Behavior: Behavior normal. Behavior is cooperative.          Thought Content: Thought content normal.         Judgment: Judgment normal.   Chest:      Chest wall: Tenderness present. No lacerations, deformity, swelling or crepitus.       Vitals and nursing note reviewed.     Generalized tenderness to the right lower ribs.  No step-off or crepitus.         UCC Course   There were no labs reviewed with this patient during the visit.  There were no x-rays reviewed with this patient during the visit.  Current Inpatient Medications with Last Dose Taken[3]       Procedures   Procedures      MDM/Assessment    Bissie is a 72 y.o. female presenting to UC with a complaint of head injury and laceration.  The patient appears clinically well, non-toxic, and is not in acute distress.    Based on patient present history of head trauma and her age with recommend getting a CT.  Patient advised to go to the emergency room for further workup.  Necessary paperwork and the emergency department address were provided to the patient/family.    The patient/family understands the urgency and will proceed to the ED immediately, requesting to go to [facility].  The patient was accompanied by their daughter in law and was transported to the ED POV. The patient remained clinically stable upon discharge and was ambulatory  [facility] called and report given.         Chart Review:  Prior PCP, Specialist and/or ED notes reviewed today: No  Prior labs/images/studies reviewed today: No      Encounter Diagnoses   Name Primary?    Unspecified injury of head, initial encounter Yes    Laceration of forehead, initial encounter  Plan    See mdm     Orders Placed This Encounter   Procedures    Referal to Emergency Department (AKA ED) (EXTERNAL)     Requested Prescriptions      No prescriptions requested or ordered in this encounter          An After Visit Summary  with pertinent information was made available to patient/family via MyChart or in-print.       [1]   Current Outpatient Medications:     albuterol sulfate HFA (PROVENTIL) 108 (90 Base) MCG/ACT inhaler, Inhale 2 puffs into the lungs, Disp: , Rfl:     buPROPion SR (Wellbutrin SR) 150 MG 12 hr tablet, Take 1 tablet (150 mg) by mouth 2 (two) times daily, Disp: , Rfl:     fluticasone-salmeterol (ADVAIR DISKUS) 100-50 MCG/ACT Aerosol Pwdr, Breath Activated, Inhale 1 puff into the lungs, Disp: , Rfl:     guaiFENesin (MUCINEX) 600 MG 12 hr tablet, Take 2 tablets (1,200 mg) by mouth 2 (two) times daily, Disp: , Rfl:     losartan (COZAAR) 100 MG tablet, Take 1 tablet (100 mg) by mouth daily, Disp: , Rfl:     methylPREDNISolone (MEDROL DOSEPAK) 4 MG tablet, Use as directed, Disp: 21 tablet, Rfl: 0    pitavastatin calcium (Livalo) 2 MG tablet, Take 2 mg by mouth nightly, Disp: , Rfl:     DULoxetine (CYMBALTA) 30 MG capsule, Take 30 mg by mouth daily (Patient not taking: Reported on 09/24/2023), Disp: , Rfl:     fluticasone (FLONASE) 50 MCG/ACT nasal spray, 1 spray by Nasal route daily (Patient not taking: Reported on 09/24/2023), Disp: , Rfl:   [2]   Allergies  Allergen Reactions    Montelukast Palpitations, Hives and Other (See Comments)     Other Reaction(s): Bradycardia    Irregular heart beat    Other Reaction: Other reaction    Irregular heart beat   Irregular heart beat   Other Reaction: Other reaction    Atorvastatin Hives     Other Reaction(s): Other (See Comments)    Muscle aches    Kiwi Extract Hives, Nausea And Vomiting and Other (See Comments)     Other Reaction(s): Other (See Comments)    Other Reaction: Other reaction    Other Reaction: Other reaction   Other Reaction: Other reaction   Other Reaction: Other reaction    Other Hives, Nausea And Vomiting and Other (See Comments)     Other Reaction(s): Other (See Comments)    Uncoded Allergy. Allergen: CANTALOPE, Other Reaction: GI Upset   Uncoded Allergy. Allergen:  bananas, Other Reaction: GI Upset   Uncoded Allergy. Allergen: honey dew, Other Reaction: GI Upset    Uncoded Allergy. Allergen: CANTALOPE, Other Reaction: GI Upset   Uncoded Allergy. Allergen: bananas, Other Reaction: GI Upset   Uncoded Allergy. Allergen: honey dew, Other Reaction: GI Upset   Uncoded Allergy. Allergen: CANTALOPE, Other Reaction: GI Upset   Uncoded Allergy. Allergen: bananas, Other Reaction: GI Upset   Uncoded Allergy. Allergen: honey dew, Other Reaction: GI Upset    Other Drug Class Other (See Comments)     Uncoded Allergy. Allergen: honey dew, Other Reaction: GI Upset    Uncoded Allergy. Allergen: bananas, Other Reaction: GI Upset    Uncoded Allergy. Allergen: CANTALOPE, Other Reaction: GI Upset   [3]   No current facility-administered medications for this visit.

## 2023-09-24 NOTE — Patient Instructions (Signed)
Follow up with the Emergency Department for further evaluation and treatment.

## 2023-09-28 ENCOUNTER — Encounter (INDEPENDENT_AMBULATORY_CARE_PROVIDER_SITE_OTHER): Payer: Self-pay

## 2023-09-28 ENCOUNTER — Ambulatory Visit (INDEPENDENT_AMBULATORY_CARE_PROVIDER_SITE_OTHER): Payer: Medicare Other | Admitting: Family

## 2023-09-28 VITALS — BP 138/88 | HR 83 | Temp 97.7°F | Resp 16 | Ht 62.0 in | Wt 132.0 lb

## 2023-09-28 DIAGNOSIS — Z4802 Encounter for removal of sutures: Secondary | ICD-10-CM

## 2023-09-28 DIAGNOSIS — S0181XD Laceration without foreign body of other part of head, subsequent encounter: Secondary | ICD-10-CM

## 2023-09-28 NOTE — Progress Notes (Signed)
[facility]  URGENT  CARE  PROGRESS NOTE     Patient: Patty West   Date: 09/28/2023   MRN: [id number]       An Dozer is a 72 y.o. female      HISTORY     History obtained from: Patient    Chief Complaint   Patient presents with    Suture/Staple Removal          Suture/Staple Removal   Patient presents today for suture removal. Sutures were placed 5 days ago in the ER. She states she was walking her dog and she didn't realize that he was about to start running and he pulled her right in to a light poll.    Review of Systems   All other systems reviewed and are negative.      History:    Pertinent Past Medical, Surgical, Family and Social History were reviewed.      Current Medications[1]    Allergies[2]    Medications and Allergies reviewed.    PHYSICAL EXAM     Vitals:    09/28/23 1034   BP: 138/88   BP Site: Right arm   Patient Position: Sitting   Cuff Size: Medium   Pulse: 83   Resp: 16   Temp: 97.7 F (36.5 C)   TempSrc: Tympanic   SpO2: 98%   Weight: 59.9 kg (132 lb)   Height: 1.575 m (5\' 2" )       Physical Exam  Constitutional:       Appearance: Normal appearance.   HENT:      Head: Normocephalic.   Pulmonary:      Effort: Pulmonary effort is normal.   Neurological:      Mental Status: She is alert and oriented to person, place, and time.   Skin:     General: Skin is warm.   Vitals and nursing note reviewed.         UCC COURSE     There were no labs reviewed with this patient during the visit.    There were no x-rays reviewed with this patient during the visit.    Current Inpatient Medications with Last Dose Taken[3]    PROCEDURES     Suture Removal    Date/Time: 09/28/2023 10:51 AM    Performed by: Valera Castle, FNP  Authorized by: Valera Castle, FNP    Consent:     Consent obtained:  Verbal    Consent given by:  Patient    Risks discussed:  Pain and poor cosmetic result  Body area:  Head/neck  Location details:  Forehead  Wound appearance:  Clean  Sutures removed:  6  Post-removal:   Antibiotic ointment applied  Patient tolerance:  Patient tolerated the procedure well with no immediate complications      MEDICAL DECISION MAKING     History, physical, labs/studies most consistent with suture removal as the diagnosis.    Chart Review:  Prior PCP, Specialist and/or ED notes reviewed today: Not Applicable  Prior labs/images/studies reviewed today: Not Applicable      ASSESSMENT     Encounter Diagnosis   Name Primary?    Visit for suture removal Yes                PLAN      PLAN: Sutures removed with out issue. Continue applying bacitracin twice a day for the next 2 days. Then allow to fully heal on its own. Once fully healed, may start applying  bacitracin to help with preventing scaring.           Orders Placed This Encounter   Procedures    Suture Removal     Requested Prescriptions      No prescriptions requested or ordered in this encounter       Discussed results and diagnosis with patient/family. Reviewed warning signs for worsening condition, as well as, indications for follow-up with primary care physician and return to urgent care clinic. Patient/family expressed understanding of instructions.     An After Visit Summary was provided to the patient.           [1]   Current Outpatient Medications:     albuterol sulfate HFA (PROVENTIL) 108 (90 Base) MCG/ACT inhaler, Inhale 2 puffs into the lungs, Disp: , Rfl:     buPROPion SR (Wellbutrin SR) 150 MG 12 hr tablet, Take 1 tablet (150 mg) by mouth 2 (two) times daily, Disp: , Rfl:     fluticasone-salmeterol (ADVAIR DISKUS) 100-50 MCG/ACT Aerosol Pwdr, Breath Activated, Inhale 1 puff into the lungs, Disp: , Rfl:     guaiFENesin (MUCINEX) 600 MG 12 hr tablet, Take 2 tablets (1,200 mg) by mouth 2 (two) times daily, Disp: , Rfl:     losartan (COZAAR) 100 MG tablet, Take 1 tablet (100 mg) by mouth daily, Disp: , Rfl:     methylPREDNISolone (MEDROL DOSEPAK) 4 MG tablet, Use as directed, Disp: 21 tablet, Rfl: 0    pitavastatin calcium (Livalo) 2 MG  tablet, Take 2 mg by mouth nightly, Disp: , Rfl:     DULoxetine (CYMBALTA) 30 MG capsule, Take 30 mg by mouth daily (Patient not taking: Reported on 09/28/2023), Disp: , Rfl:     fluticasone (FLONASE) 50 MCG/ACT nasal spray, 1 spray by Nasal route daily (Patient not taking: Reported on 09/28/2023), Disp: , Rfl:   [2]   Allergies  Allergen Reactions    Montelukast Palpitations, Hives and Other (See Comments)     Other Reaction(s): Bradycardia    Irregular heart beat    Other Reaction: Other reaction    Irregular heart beat   Irregular heart beat   Other Reaction: Other reaction    Atorvastatin Hives     Other Reaction(s): Other (See Comments)    Muscle aches    Kiwi Extract Hives, Nausea And Vomiting and Other (See Comments)     Other Reaction(s): Other (See Comments)    Other Reaction: Other reaction    Other Reaction: Other reaction   Other Reaction: Other reaction   Other Reaction: Other reaction    Other Hives, Nausea And Vomiting and Other (See Comments)     Other Reaction(s): Other (See Comments)    Uncoded Allergy. Allergen: CANTALOPE, Other Reaction: GI Upset   Uncoded Allergy. Allergen: bananas, Other Reaction: GI Upset   Uncoded Allergy. Allergen: honey dew, Other Reaction: GI Upset    Uncoded Allergy. Allergen: CANTALOPE, Other Reaction: GI Upset   Uncoded Allergy. Allergen: bananas, Other Reaction: GI Upset   Uncoded Allergy. Allergen: honey dew, Other Reaction: GI Upset   Uncoded Allergy. Allergen: CANTALOPE, Other Reaction: GI Upset   Uncoded Allergy. Allergen: bananas, Other Reaction: GI Upset   Uncoded Allergy. Allergen: honey dew, Other Reaction: GI Upset    Other Drug Class Other (See Comments)     Uncoded Allergy. Allergen: honey dew, Other Reaction: GI Upset    Uncoded Allergy. Allergen: bananas, Other Reaction: GI Upset    Uncoded Allergy. Allergen: CANTALOPE, Other Reaction: GI Upset   [  3]   No current facility-administered medications for this visit.
